# Patient Record
Sex: Female | Born: 1935 | Race: White | Hispanic: No | Smoking: Never smoker
Health system: Southern US, Community
[De-identification: ages and names within clinical notes are randomized; demographics above are authoritative.]

## PROBLEM LIST (undated history)

## (undated) DIAGNOSIS — H3530 Unspecified macular degeneration: Secondary | ICD-10-CM

## (undated) DIAGNOSIS — Z9289 Personal history of other medical treatment: Secondary | ICD-10-CM

## (undated) DIAGNOSIS — C189 Malignant neoplasm of colon, unspecified: Secondary | ICD-10-CM

## (undated) DIAGNOSIS — J189 Pneumonia, unspecified organism: Secondary | ICD-10-CM

## (undated) DIAGNOSIS — C541 Malignant neoplasm of endometrium: Secondary | ICD-10-CM

## (undated) DIAGNOSIS — H9190 Unspecified hearing loss, unspecified ear: Secondary | ICD-10-CM

## (undated) DIAGNOSIS — I639 Cerebral infarction, unspecified: Secondary | ICD-10-CM

## (undated) DIAGNOSIS — G43909 Migraine, unspecified, not intractable, without status migrainosus: Secondary | ICD-10-CM

## (undated) DIAGNOSIS — I10 Essential (primary) hypertension: Secondary | ICD-10-CM

## (undated) DIAGNOSIS — R7611 Nonspecific reaction to tuberculin skin test without active tuberculosis: Secondary | ICD-10-CM

## (undated) HISTORY — DX: Migraine, unspecified, not intractable, without status migrainosus: G43.909

## (undated) HISTORY — PX: APPENDECTOMY: SHX54

## (undated) HISTORY — DX: Personal history of other medical treatment: Z92.89

## (undated) HISTORY — DX: Malignant neoplasm of endometrium: C54.1

## (undated) HISTORY — DX: Pneumonia, unspecified organism: J18.9

## (undated) HISTORY — DX: Cerebral infarction, unspecified: I63.9

## (undated) HISTORY — DX: Essential (primary) hypertension: I10

## (undated) HISTORY — DX: Nonspecific reaction to tuberculin skin test without active tuberculosis: R76.11

## (undated) HISTORY — DX: Malignant neoplasm of colon, unspecified: C18.9

---
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
## 1968-05-14 HISTORY — PX: GALLBLADDER SURGERY: SHX652

## 1987-05-15 DIAGNOSIS — C541 Malignant neoplasm of endometrium: Secondary | ICD-10-CM

## 1987-05-15 HISTORY — DX: Malignant neoplasm of endometrium: C54.1

## 1987-05-15 HISTORY — PX: ABDOMINAL HYSTERECTOMY: SHX81

## 2000-05-14 DIAGNOSIS — I639 Cerebral infarction, unspecified: Secondary | ICD-10-CM

## 2000-05-14 HISTORY — DX: Cerebral infarction, unspecified: I63.9

## 2000-05-14 HISTORY — PX: SUBDURAL HEMATOMA EVACUATION VIA CRANIOTOMY: SUR319

## 2007-05-15 DIAGNOSIS — C189 Malignant neoplasm of colon, unspecified: Secondary | ICD-10-CM

## 2007-05-15 HISTORY — DX: Malignant neoplasm of colon, unspecified: C18.9

## 2007-08-27 HISTORY — PX: COLON RESECTION: SHX5231

## 2012-11-24 ENCOUNTER — Ambulatory Visit (INDEPENDENT_AMBULATORY_CARE_PROVIDER_SITE_OTHER): Payer: Medicare PPO | Admitting: Family Medicine

## 2012-11-24 ENCOUNTER — Encounter: Payer: Self-pay | Admitting: Family Medicine

## 2012-11-24 ENCOUNTER — Ambulatory Visit: Payer: Self-pay | Admitting: Family Medicine

## 2012-11-24 VITALS — BP 136/72 | Temp 98.0°F | Ht 61.5 in | Wt 130.0 lb

## 2012-11-24 DIAGNOSIS — Z8673 Personal history of transient ischemic attack (TIA), and cerebral infarction without residual deficits: Secondary | ICD-10-CM

## 2012-11-24 DIAGNOSIS — I10 Essential (primary) hypertension: Secondary | ICD-10-CM

## 2012-11-24 DIAGNOSIS — E039 Hypothyroidism, unspecified: Secondary | ICD-10-CM

## 2012-11-24 DIAGNOSIS — Z7189 Other specified counseling: Secondary | ICD-10-CM

## 2012-11-24 DIAGNOSIS — Z7689 Persons encountering health services in other specified circumstances: Secondary | ICD-10-CM

## 2012-11-24 LAB — BASIC METABOLIC PANEL
BUN: 15 mg/dL (ref 6–23)
GFR: 75.57 mL/min (ref >60.00)
Potassium: 4.8 mEq/L (ref 3.5–5.1)

## 2012-11-24 LAB — LIPID PANEL
Cholesterol: 145 mg/dL (ref 0–200)
VLDL: 14.2 mg/dL (ref 0.0–40.0)

## 2012-11-24 LAB — TSH: TSH: 1.13 u[IU]/mL (ref 0.35–5.50)

## 2012-11-24 MED ORDER — LISINOPRIL 20 MG PO TABS: 20.0000 mg | ORAL_TABLET | Freq: Every day | ORAL | Status: DC

## 2012-11-24 MED ORDER — LEVOTHYROXINE SODIUM 75 MCG PO TABS: 75.0000 ug | ORAL_TABLET | Freq: Every day | ORAL | Status: DC

## 2012-11-24 NOTE — Patient Instructions (Addendum)
-  We have ordered labs or studies at this visit. It can take up to 1-2 weeks for results and processing. We will contact you with instructions IF your results are abnormal. Normal results will be released to your MYCHART. If you have not heard from us or can not find your results in MYCHART in 2 weeks please contact our office.  -PLEASE SIGN UP FOR MYCHART TODAY   We recommend the following healthy lifestyle measures: - eat a healthy diet consisting of lots of vegetables, fruits, beans, nuts, seeds, healthy meats such as white chicken and fish and whole grains.  - avoid fried foods, fast food, processed foods, sodas, red meet and other fattening foods.  - get a least 150 minutes of aerobic exercise per week.   Follow up in: 4-6 months  

## 2012-11-24 NOTE — Progress Notes (Signed)
Chief Complaint  Patient presents with  . Establish Care    HPI:  Lori [name] is here to establish care. She has no concerns today. She has recently moved from [place] and now is living with her daughter. She reports a history of colon cancer and clean colonoscopy 3 years ago. She currently does not desire any further follow up of surveillance for this. Reports she feels well. Denies CP, SOB, DOE, bowel or bladder issues.  Has the following chronic problems and concerns today:  Patient Active Problem List   Diagnosis Date Noted  . Essential hypertension, benign 11/24/2012  . Hx of TIA (transient ischemic attack)  11/24/2012  . Unspecified hypothyroidism 11/24/2012   ROS: See pertinent positives and negatives per HPI.  Past Medical History  Diagnosis Date  . Arthritis   . Hypertension   . Migraines   . History of blood transfusion   . Positive TB test   . Stroke 2002    ? TIA, brief episode of speech issues - evaluate [place]  . Cancer 1989    endometrial s/p hysterectomy, chemo and radiation  . Colon cancer 2009    s/p colon resection    Family History  Problem Relation Age of Onset  . Arthritis Mother   . Heart disease Father   . Hyperlipidemia Father   . Hypertension Father   . Diabetes Sister     History   Social History  . Marital Status: Widowed    Spouse Name: N/A    Number of Children: N/A  . Years of Education: N/A   Social History Main Topics  . Smoking status: Never Smoker   . Smokeless tobacco: None  . Alcohol Use: Yes     Comment: occ glass of wine   . Drug Use: None  . Sexually Active: None   Other Topics Concern  . None   Social History Narrative   Work or School: retired      Ecologist Situation: lives with her daughter (HCPOA: Brooklyn Alfredo)      Spiritual Beliefs: Christian      Lifestyle: walks             Current outpatient prescriptions:aspirin 325 MG tablet, Take 325 mg by mouth daily., Disp: , Rfl: ;  CALCIUM PO, Take by mouth  daily., Disp: , Rfl: ;  Cholecalciferol (VITAMIN D PO), Take by mouth daily., Disp: , Rfl: ;  levothyroxine (SYNTHROID, LEVOTHROID) 75 MCG tablet, Take 1 tablet (75 mcg total) by mouth daily before breakfast., Disp: 90 tablet, Rfl: 3 lisinopril (PRINIVIL,ZESTRIL) 20 MG tablet, Take 1 tablet (20 mg total) by mouth daily., Disp: 90 tablet, Rfl: 3;  Multiple Vitamins-Minerals (OCUVITE ADULT 50+ PO), Take by mouth daily., Disp: , Rfl: ;  Riboflavin (VITAMIN B-2 PO), Take by mouth daily., Disp: , Rfl: ;  vitamin C (ASCORBIC ACID) 500 MG tablet, Take 500 mg by mouth daily., Disp: , Rfl:   EXAM:  Filed Vitals:   11/24/12 1056  BP: 136/72  Temp: 98 F (36.7 C)    Body mass index is 24.17 kg/(m^2).  GENERAL: vitals reviewed and listed above, alert, oriented, appears well hydrated and in no acute distress  HEENT: atraumatic, conjunttiva clear, no obvious abnormalities on inspection of external nose and ears  NECK: no obvious masses on inspection  LUNGS: clear to auscultation bilaterally, no wheezes, rales or rhonchi, good air movement  CV: HRRR, no peripheral edema  MS: moves all extremities without noticeable abnormality  PSYCH: pleasant and  cooperative, no obvious depression or anxiety  ASSESSMENT AND PLAN:  Discussed the following assessment and plan:  Essential hypertension, benign - Plan: Basic metabolic panel, Lipid Panel, lisinopril (PRINIVIL,ZESTRIL) 20 MG tablet  Hx of TIA (transient ischemic attack)  - Plan: Lipid Panel  Unspecified hypothyroidism - Plan: TSH, levothyroxine (SYNTHROID, LEVOTHROID) 75 MCG tablet  Encounter to establish care  -We reviewed the PMH, PSH, FH, SH, Meds and Allergies. -We provided refills for any medications we will prescribe as needed. -We addressed current concerns per orders and patient instructions. -We have asked for records for pertinent exams, studies, vaccines and notes from previous providers. -We have advised patient to follow up per  instructions below.  -Patient advised to return or notify a doctor immediately if symptoms worsen or persist or new concerns arise.  Patient Instructions  -We have ordered labs or studies at this visit. It can take up to 1-2 weeks for results and processing. We will contact you with instructions IF your results are abnormal. Normal results will be released to your [facility]. If you have not heard from [place] or can not find your results in [facility] in 2 weeks please contact our office.  -PLEASE SIGN UP FOR MYCHART TODAY   We recommend the following healthy lifestyle measures: - eat a healthy diet consisting of lots of vegetables, fruits, beans, nuts, seeds, healthy meats such as white chicken and fish and whole grains.  - avoid fried foods, fast food, processed foods, sodas, red meet and other fattening foods.  - get a least 150 minutes of aerobic exercise per week.   Follow up in: 4-6 months      Lunette Tapp R.

## 2012-11-26 NOTE — Progress Notes (Signed)
Quick Note:  Left a detailed message for pt at designated cell phone number. ______ 

## 2012-12-11 ENCOUNTER — Encounter: Payer: Self-pay | Admitting: Family Medicine

## 2012-12-11 NOTE — Progress Notes (Signed)
Some records received from prior PCP - labs, CT and MRI head, CT chest form last few years. Placed in scan box.

## 2013-01-02 ENCOUNTER — Ambulatory Visit (INDEPENDENT_AMBULATORY_CARE_PROVIDER_SITE_OTHER): Payer: Medicare PPO | Admitting: Family Medicine

## 2013-01-02 ENCOUNTER — Encounter: Payer: Self-pay | Admitting: Family Medicine

## 2013-01-02 VITALS — BP 138/88 | Temp 98.3°F | Wt 130.0 lb

## 2013-01-02 DIAGNOSIS — I10 Essential (primary) hypertension: Secondary | ICD-10-CM

## 2013-01-02 DIAGNOSIS — T148 Other injury of unspecified body region: Secondary | ICD-10-CM

## 2013-01-02 DIAGNOSIS — W57XXXA Bitten or stung by nonvenomous insect and other nonvenomous arthropods, initial encounter: Secondary | ICD-10-CM

## 2013-01-02 MED ORDER — TRIAMCINOLONE ACETONIDE 0.1 % EX CREA: TOPICAL_CREAM | Freq: Two times a day (BID) | CUTANEOUS | Status: DC

## 2013-01-02 NOTE — Progress Notes (Signed)
Chief Complaint  Patient presents with  . sore on ankle    HPI:  Acute visit for:  1)Sore on Leg: -noticed 2 weeks ago -was very itchy - thought it was a bug bite -tried a little hydrocortisone cream -oozes a little so thought might be infected  ROS: See pertinent positives and negatives per HPI.  Past Medical History  Diagnosis Date  . Arthritis   . Hypertension   . Migraines   . History of blood transfusion   . Positive TB test   . Stroke 2002    ? TIA, brief episode of speech issues - evaluate [place]  . Cancer 1989    endometrial s/p hysterectomy, chemo and radiation  . Colon cancer 2009    s/p colon resection    Family History  Problem Relation Age of Onset  . Arthritis Mother   . Heart disease Father   . Hyperlipidemia Father   . Hypertension Father   . Diabetes Sister     History   Social History  . Marital Status: Widowed    Spouse Name: N/A    Number of Children: N/A  . Years of Education: N/A   Social History Main Topics  . Smoking status: Never Smoker   . Smokeless tobacco: None  . Alcohol Use: Yes     Comment: occ glass of wine   . Drug Use: None  . Sexual Activity: None   Other Topics Concern  . None   Social History Narrative   Work or School: retired      Ecologist Situation: lives with her daughter (HCPOA: Joselin Crandell)      Spiritual Beliefs: Christian      Lifestyle: walks             Current outpatient prescriptions:aspirin 325 MG tablet, Take 325 mg by mouth daily., Disp: , Rfl: ;  CALCIUM PO, Take by mouth daily., Disp: , Rfl: ;  Cholecalciferol (VITAMIN D PO), Take by mouth daily., Disp: , Rfl: ;  levothyroxine (SYNTHROID, LEVOTHROID) 75 MCG tablet, Take 1 tablet (75 mcg total) by mouth daily before breakfast., Disp: 90 tablet, Rfl: 3 lisinopril (PRINIVIL,ZESTRIL) 20 MG tablet, Take 1 tablet (20 mg total) by mouth daily., Disp: 90 tablet, Rfl: 3;  Multiple Vitamins-Minerals (OCUVITE ADULT 50+ PO), Take by mouth daily., Disp: ,  Rfl: ;  Riboflavin (VITAMIN B-2 PO), Take by mouth daily., Disp: , Rfl: ;  vitamin C (ASCORBIC ACID) 500 MG tablet, Take 500 mg by mouth daily., Disp: , Rfl: ;  triamcinolone cream (KENALOG) 0.1 %, Apply topically 2 (two) times daily., Disp: 30 g, Rfl: 0  EXAM:  Filed Vitals:   01/02/13 1328  BP: 138/88  Temp: 98.3 F (36.8 C)    Body mass index is 24.17 kg/(m^2).  GENERAL: vitals reviewed and listed above, alert, oriented, appears well hydrated and in no acute distress  HEENT: atraumatic, conjunttiva clear, no obvious abnormalities on inspection of external nose and ears  NECK: no obvious masses on inspection  LUNGS: clear to auscultation bilaterally, no wheezes, rales or rhonchi, good air movement  CV: HRRR, no peripheral edema  MS: moves all extremities without noticeable abnormality  SKIN: small healing scab on L ankle  PSYCH: pleasant and cooperative, no obvious depression or anxiety  ASSESSMENT AND PLAN:  Discussed the following assessment and plan:  Insect bite - Plan: triamcinolone cream (KENALOG) 0.1 % -does not appear infected and appears to be healing, if persist will refer to derm for removal  Essential hypertension, benign -BP up initially, better on recheck  -Patient advised to return or notify a doctor immediately if symptoms worsen or persist or new concerns arise.  Patient Instructions  -apply antibiotic ointment twice daily and steroid cream 1-2 times daily  -follow in 2-4 weeks for physical exam     Gildardo Tickner R.

## 2013-01-02 NOTE — Patient Instructions (Addendum)
-  apply antibiotic ointment twice daily and steroid cream 1-2 times daily  -follow in 2-4 weeks for physical exam

## 2013-01-30 ENCOUNTER — Ambulatory Visit (INDEPENDENT_AMBULATORY_CARE_PROVIDER_SITE_OTHER): Payer: Medicare PPO | Admitting: Family Medicine

## 2013-01-30 ENCOUNTER — Encounter: Payer: Self-pay | Admitting: Family Medicine

## 2013-01-30 VITALS — BP 140/80 | Temp 98.1°F | Ht 61.5 in | Wt 128.0 lb

## 2013-01-30 DIAGNOSIS — I10 Essential (primary) hypertension: Secondary | ICD-10-CM

## 2013-01-30 DIAGNOSIS — Z0000 Encounter for general adult medical examination without abnormal findings: Secondary | ICD-10-CM

## 2013-01-30 DIAGNOSIS — E039 Hypothyroidism, unspecified: Secondary | ICD-10-CM

## 2013-01-30 DIAGNOSIS — Z23 Encounter for immunization: Secondary | ICD-10-CM

## 2013-01-30 LAB — BASIC METABOLIC PANEL
BUN: 13 mg/dL (ref 6–23)
Calcium: 9.2 mg/dL (ref 8.4–10.5)
GFR: 85.89 mL/min (ref >60.00)
Glucose, Bld: 88 mg/dL (ref 70–99)

## 2013-01-30 NOTE — Progress Notes (Signed)
Medicare Annual Preventive Care Visit  (initial annual wellness or annual wellness exam)  Chronic:  1)Hypothyroid: -stable  2)HTN: -stable  1.) Patient-completed health risk assessment  - completed and reviewed, see scanned documentation  2.) Review of Medical History: -PMH, PSH, Family History and current specialty and care providers reviewed and updated and listed below  - see chart and below  3.) Review of functional ability and level of safety:  Any difficulty hearing? YES, mild   History of falling? NO  Any trouble with IADLs - using a phone, using transportation, grocery shopping, preparing meals, doing housework, doing laundry, taking medications and managing money? NO  Advance Directives? LIDIA, CLAVIJO - [phone]  See summary of recommendations in Patient Instructions below.  4.) Physical Exam Filed Vitals:   01/30/13 0931  BP: 140/80  Temp: 98.1 F (36.7 C)   Estimated body mass index is 23.8 kg/(m^2) as calculated from the following:   Height as of this encounter: 5' 1.5" (1.562 m).   Weight as of this encounter: 128 lb (58.06 kg).  Mini Cog: 1. Patient instructed to listen carefully and repeat the following: Apple Watch    Penny  2. Clock drawing test was administered: NORMAL      3. Recall of three words: 3/3  Scoring:   Patient Score: NEG   See patient instructions for recommendations.  4)The following written screening schedule of preventive measures were reviewed with assessment and plan made per below, orders and patient instructions:         Alcohol screening: occ glass of wine     Obesity Screening and counseling: N/A     STI screening:  N/A     Tobacco Screening: not a smoker       Pneumococcal  ASSESSMENT/PLAN: given today      Screening mammograph (yearly if >40) ASSESSMENT/PLAN: refused      Screening Pap smear/pelvic exam (q2 years) ASSESSMENT/PLAN: aged out      Colorectal cancer screening (FOBT  yearly or flex sig q4y or colonoscopy q10y or barium enema q4y) ASSESSMENT/PLAN: history of colon cancer, refuses any further monitoring of this      Diabetes outpatient self-management training services ASSESSMENT/PLAN: N/A      Bone mass measurements(covered q2y if indicated - estrogen def, osteoporosis, hyperparathyroid, vertebral abnormalities, osteoporosis or steroids) ASSESSMENT/PLAN: refuses      Screening for glaucoma(q1y if high risk - diabetes, FH, AA and > 50 or hispanic and > 65) ASSESSMENT/PLAN: sees an eye doctor on regular basis      Medical nutritional therapy for individuals with diabetes or renal disease ASSESSMENT/PLAN: N/A      Cardiovascular screening blood tests (lipids q5y) ASSESSMENT/PLAN: done 11/2012      Diabetes screening tests ASSESSMENT/PLAN: done 11/2012   7.) Summary: -risk factors and conditions per above assessment were discussed and treatment, recommendations and referrals were offered per documentation above and orders and patient instructions.

## 2013-01-30 NOTE — Addendum Note (Signed)
Addended by: Terressa Koyanagi on: 01/30/2013 10:12 AM   Modules accepted: Orders

## 2013-01-30 NOTE — Addendum Note (Signed)
Addended by: Azucena Freed on: 01/30/2013 11:24 AM   Modules accepted: Orders

## 2013-01-30 NOTE — Progress Notes (Signed)
Quick Note:  Left a message for return call. ______ 

## 2013-01-30 NOTE — Patient Instructions (Addendum)
-  see audiology for hearing exam  -check into [employer] for exercise and balance  -We have ordered labs or studies at this visit. It can take up to 1-2 weeks for results and processing. We will contact you with instructions IF your results are abnormal. Normal results will be released to your [facility]. If you have not heard from [place] or can not find your results in [facility] in 2 weeks please contact our office.

## 2013-01-31 NOTE — Progress Notes (Signed)
Quick Note:  Called and spoke with pt and pt is aware. Labs mailed to pt's home address. ______

## 2013-03-16 ENCOUNTER — Ambulatory Visit (INDEPENDENT_AMBULATORY_CARE_PROVIDER_SITE_OTHER): Payer: Medicare PPO | Admitting: Internal Medicine

## 2013-03-16 ENCOUNTER — Encounter: Payer: Self-pay | Admitting: Internal Medicine

## 2013-03-16 VITALS — BP 120/84 | HR 71 | Temp 97.7°F | Ht 61.5 in | Wt 129.0 lb

## 2013-03-16 DIAGNOSIS — N3090 Cystitis, unspecified without hematuria: Secondary | ICD-10-CM

## 2013-03-16 DIAGNOSIS — R300 Dysuria: Secondary | ICD-10-CM

## 2013-03-16 LAB — POCT URINALYSIS DIPSTICK
Glucose, UA: NEGATIVE
Nitrite, UA: NEGATIVE
Spec Grav, UA: 1.02
Urobilinogen, UA: 0.2

## 2013-03-16 MED ORDER — CEFUROXIME AXETIL 250 MG PO TABS: 250.0000 mg | ORAL_TABLET | Freq: Two times a day (BID) | ORAL | Status: DC

## 2013-03-16 NOTE — Progress Notes (Signed)
Subjective:    Patient ID: Lori [name], female    DOB: 08/31/1918, 79 y.o.   MRN: [id number]  HPI  79 year old white female with history of hypertension, endometrial cancer and colon cancer complains of possible bladder infection. Her symptoms started 4-5 days ago. She has not had a bladder infection over a year. She has been trying to self treat with drinking plenty of water and cranberry juice. She may need complains of frequent urination and "yunky sensation".    Patient reports that due to her her treatment for endometrial cancer with radiation, she was told in the past she has a "pebbled" appearance on cystoscopy.  Review of Systems  Constitutional: Negative for fever and chills.  Genitourinary: Positive for frequency. Negative for dysuria, flank pain and pelvic pain.       Past Medical History  Diagnosis Date  . Arthritis   . Hypertension   . Migraines   . History of blood transfusion   . Positive TB test   . Stroke 2002    ? TIA, brief episode of speech issues - evaluate [place]  . Cancer 1989    endometrial s/p hysterectomy, chemo and radiation  . Colon cancer 2009    s/p colon resection    History   Social History  . Marital Status: Widowed    Spouse Name: N/A    Number of Children: N/A  . Years of Education: N/A   Occupational History  . Not on file.   Social History Main Topics  . Smoking status: Never Smoker   . Smokeless tobacco: Not on file  . Alcohol Use: Yes     Comment: occ glass of wine   . Drug Use: Not on file  . Sexual Activity: Not on file   Other Topics Concern  . Not on file   Social History Narrative   Work or School: retired      Ecologist Situation: lives with her daughter (HCPOA: Selenne Coggin)      Spiritual Beliefs: Christian      Lifestyle: walks             Past Surgical History  Procedure Laterality Date  . Abdominal hysterectomy  1989  . Gallbladder surgery  1970  . Tonsillectomy and adenoidectomy  1943  . Subdural  hematoma evacuation via craniotomy    . Colon resection      Family History  Problem Relation Age of Onset  . Arthritis Mother   . Heart disease Father   . Hyperlipidemia Father   . Hypertension Father   . Diabetes Sister     Allergies  Allergen Reactions  . Bactrim [Sulfamethoxazole-Trimethoprim]   . Macrobid [Nitrofurantoin Macrocrystal]   . Penicillins   . Sulfa Antibiotics     Current Outpatient Prescriptions on File Prior to Visit  Medication Sig Dispense Refill  . aspirin 325 MG tablet Take 325 mg by mouth daily.      Marland Kitchen CALCIUM PO Take by mouth daily.      . Cholecalciferol (VITAMIN D PO) Take by mouth daily.      Marland Kitchen levothyroxine (SYNTHROID, LEVOTHROID) 75 MCG tablet Take 1 tablet (75 mcg total) by mouth daily before breakfast.  90 tablet  3  . lisinopril (PRINIVIL,ZESTRIL) 20 MG tablet Take 1 tablet (20 mg total) by mouth daily.  90 tablet  3  . Multiple Vitamins-Minerals (OCUVITE ADULT 50+ PO) Take by mouth daily.      . Riboflavin (VITAMIN B-2 PO) Take by mouth daily.      Marland Kitchen  triamcinolone cream (KENALOG) 0.1 % Apply topically 2 (two) times daily.  30 g  0  . vitamin C (ASCORBIC ACID) 500 MG tablet Take 500 mg by mouth daily.       No current facility-administered medications on file prior to visit.    BP 120/84  Pulse 71  Temp(Src) 97.7 F (36.5 C) (Oral)  Ht 5' 1.5" (1.562 m)  Wt 129 lb (58.514 kg)  BMI 23.98 kg/m2  SpO2 96%    Objective:   Physical Exam  Constitutional: She is oriented to person, place, and time. She appears well-developed and well-nourished. No distress.  HENT:  Head: Normocephalic and atraumatic.  Mouth/Throat: Oropharynx is clear and moist.  Cardiovascular: Normal rate, regular rhythm and normal heart sounds.   No murmur heard. Pulmonary/Chest: Effort normal and breath sounds normal. She has no wheezes.  Abdominal: Soft. Bowel sounds are normal.  Mild suprapubic tenderness, no flank tenderness to percussion  Musculoskeletal: She  exhibits no edema.  Neurological: She is alert and oriented to person, place, and time. No cranial nerve deficit.  Skin: Skin is warm and dry.          Assessment & Plan:

## 2013-03-16 NOTE — Patient Instructions (Signed)
Drink plenty of fluids Please contact our office if your symptoms do not improve or gets worse.  

## 2013-03-16 NOTE — Assessment & Plan Note (Addendum)
79 year old white female with signs and symptoms of cystitis. Treat with cefuroxime 250 mg twice daily for 5 days. Send urine for culture.  Patient advised to increase fluid intake.  Patient advised to call office if symptoms persist or worsen.

## 2013-03-19 LAB — URINE CULTURE: Colony Count: >=100000

## 2013-03-23 ENCOUNTER — Ambulatory Visit: Payer: Medicare PPO | Admitting: Family Medicine

## 2013-04-30 ENCOUNTER — Emergency Department (HOSPITAL_COMMUNITY): Payer: Medicare PPO

## 2013-04-30 ENCOUNTER — Emergency Department (HOSPITAL_COMMUNITY)
Admission: EM | Admit: 2013-04-30 | Discharge: 2013-04-30 | Disposition: A | Discharge status: Home / Self Care | Payer: Medicare PPO | Attending: Emergency Medicine | Admitting: Emergency Medicine

## 2013-04-30 ENCOUNTER — Encounter (HOSPITAL_COMMUNITY): Payer: Self-pay | Admitting: Emergency Medicine

## 2013-04-30 ENCOUNTER — Telehealth: Payer: Self-pay | Admitting: Family Medicine

## 2013-04-30 DIAGNOSIS — R51 Headache: Secondary | ICD-10-CM | POA: Insufficient documentation

## 2013-04-30 DIAGNOSIS — Z79899 Other long term (current) drug therapy: Secondary | ICD-10-CM | POA: Insufficient documentation

## 2013-04-30 DIAGNOSIS — Z9889 Other specified postprocedural states: Secondary | ICD-10-CM | POA: Insufficient documentation

## 2013-04-30 DIAGNOSIS — I10 Essential (primary) hypertension: Secondary | ICD-10-CM | POA: Insufficient documentation

## 2013-04-30 DIAGNOSIS — R197 Diarrhea, unspecified: Secondary | ICD-10-CM | POA: Insufficient documentation

## 2013-04-30 DIAGNOSIS — Z923 Personal history of irradiation: Secondary | ICD-10-CM | POA: Insufficient documentation

## 2013-04-30 DIAGNOSIS — M129 Arthropathy, unspecified: Secondary | ICD-10-CM | POA: Insufficient documentation

## 2013-04-30 DIAGNOSIS — Z8542 Personal history of malignant neoplasm of other parts of uterus: Secondary | ICD-10-CM | POA: Insufficient documentation

## 2013-04-30 DIAGNOSIS — Z8611 Personal history of tuberculosis: Secondary | ICD-10-CM | POA: Insufficient documentation

## 2013-04-30 DIAGNOSIS — Z85038 Personal history of other malignant neoplasm of large intestine: Secondary | ICD-10-CM | POA: Insufficient documentation

## 2013-04-30 DIAGNOSIS — Z7982 Long term (current) use of aspirin: Secondary | ICD-10-CM | POA: Insufficient documentation

## 2013-04-30 DIAGNOSIS — Z90710 Acquired absence of both cervix and uterus: Secondary | ICD-10-CM | POA: Insufficient documentation

## 2013-04-30 DIAGNOSIS — G43909 Migraine, unspecified, not intractable, without status migrainosus: Secondary | ICD-10-CM | POA: Insufficient documentation

## 2013-04-30 DIAGNOSIS — Z880 Allergy status to penicillin: Secondary | ICD-10-CM | POA: Insufficient documentation

## 2013-04-30 LAB — CBC WITH DIFFERENTIAL/PLATELET
Basophils Relative: 0 % (ref 0–1)
Eosinophils Absolute: 0.1 10*3/uL (ref 0.0–0.7)
Hemoglobin: 12.5 g/dL (ref 12.0–15.0)
Lymphocytes Relative: 12 % (ref 12–46)
MCH: 31.5 pg (ref 26.0–34.0)
MCHC: 33.7 g/dL (ref 30.0–36.0)
Monocytes Absolute: 0.7 10*3/uL (ref 0.1–1.0)
Monocytes Relative: 6 % (ref 3–12)
Neutro Abs: 8.5 10*3/uL — ABNORMAL HIGH (ref 1.7–7.7)
Neutrophils Relative %: 81 % — ABNORMAL HIGH (ref 43–77)
Platelets: 249 10*3/uL (ref 150–400)
RBC: 3.97 MIL/uL (ref 3.87–5.11)
WBC: 10.6 10*3/uL — ABNORMAL HIGH (ref 4.0–10.5)

## 2013-04-30 LAB — COMPREHENSIVE METABOLIC PANEL
ALT: 12 U/L (ref 0–35)
AST: 21 U/L (ref 0–37)
Albumin: 3.3 g/dL — ABNORMAL LOW (ref 3.5–5.2)
Alkaline Phosphatase: 77 U/L (ref 39–117)
BUN: 17 mg/dL (ref 6–23)
Calcium: 8.7 mg/dL (ref 8.4–10.5)
Chloride: 106 mEq/L (ref 96–112)
GFR calc Af Amer: 89 mL/min — ABNORMAL LOW (ref >90)
Potassium: 3.5 mEq/L (ref 3.5–5.1)
Sodium: 140 mEq/L (ref 135–145)
Total Protein: 6.6 g/dL (ref 6.0–8.3)

## 2013-04-30 MED ORDER — SODIUM CHLORIDE 0.9 % IV BOLUS (SEPSIS)
500.0000 mL | Freq: Once | INTRAVENOUS | Status: AC
  Administered 2013-04-30: 500 mL via INTRAVENOUS

## 2013-04-30 MED ORDER — ONDANSETRON HCL 4 MG/2ML IJ SOLN
4.0000 mg | Freq: Once | INTRAMUSCULAR | Status: DC
  Filled 2013-04-30: qty 2

## 2013-04-30 MED ORDER — ONDANSETRON HCL 4 MG PO TABS: 4.0000 mg | ORAL_TABLET | Freq: Four times a day (QID) | ORAL | Status: DC

## 2013-04-30 NOTE — ED Notes (Signed)
Pt reports intermittent episodes of loose stools and constipation. Pt states past episodes were not diarrhea. Pt reports PCP "says I/pt need to have a colonoscopy soon." Pt hx of colon cancer.

## 2013-04-30 NOTE — ED Notes (Signed)
Pt states she has had diarrhea 4-5x today. No n/v. Also has some abd cramping.

## 2013-04-30 NOTE — Telephone Encounter (Signed)
Patient Information:  Caller Name: Raisa  Phone: [phone]  Patient: Sabrinia, Prien  Gender: Female  DOB: 23-Jan-1919  Age: 79 Years  PCP: Selena Batten (TEXT 1st, after 20 mins can call), [facility])  Office Follow Up:  Does the office need to follow up with this patient?: Yes  Instructions For The Office: Nurse called office and spoke with Alisha.  Elease Hashimoto states she will speak to Dr. Selena Batten about a work in appt and call patient back.  RN Note:  Catherine/Daughter calling because Braylea has a hx of colon cancer and partial resection in 2009.  States she has been losing weight and having increasing episodes of diarrhea.  Having watery diarrhea this morning.  States it has been uncontrollable.  No appts today.  Called office and spoke with Alisha.  Elease Hashimoto states she will talk to Dr. Selena Batten about working in an appt and will call patient back.  Informed daughter and patient.  Symptoms  Reason For Call & Symptoms: diarrhea  Reviewed Health History In EMR: Yes  Reviewed Medications In EMR: Yes  Reviewed Allergies In EMR: Yes  Reviewed Surgeries / Procedures: Yes  Date of Onset of Symptoms: 04/30/2013  Guideline(s) Used:  Diarrhea  Disposition Per Guideline:   Go to Office Now  Reason For Disposition Reached:   Age > 60 years and has had > 6 diarrhea stools in past 24 hours  Advice Given:  Call Back If:  You become worse.  Patient Will Follow Care Advice:  YES

## 2013-04-30 NOTE — ED Provider Notes (Signed)
CSN: [id number]     Arrival date & time 04/30/13  1258 History   First MD Initiated Contact with Patient 04/30/13 1317     Chief Complaint  Patient presents with  . Diarrhea   (Consider location/radiation/quality/duration/timing/severity/associated sxs/prior Treatment) HPI Comments: Patient presents to the ER for evaluation of watery diarrhea. Patient reports that she woke this morning and had urge to defecate. She reports explosive diarrhea. Since then she has had several episodes of watery, nonbloody diarrhea. She says this has tapered off within the last hour no further bowel movements. She had some nausea but no vomiting. There is cramping of the abdomen with the diarrhea, no abdominal pain currently. She has slight headache earlier, took an aspirin and has resolved.  Patient is a 79 y.o. female presenting with diarrhea.  Diarrhea Associated symptoms: headaches   Associated symptoms: no fever and no vomiting     Past Medical History  Diagnosis Date  . Arthritis   . Hypertension   . Migraines   . History of blood transfusion   . Positive TB test   . Stroke 2002    ? TIA, brief episode of speech issues - evaluate [place]  . Cancer 1989    endometrial s/p hysterectomy, chemo and radiation  . Colon cancer 2009    s/p colon resection   Past Surgical History  Procedure Laterality Date  . Abdominal hysterectomy  1989  . Gallbladder surgery  1970  . Tonsillectomy and adenoidectomy  1943  . Subdural hematoma evacuation via craniotomy    . Colon resection     Family History  Problem Relation Age of Onset  . Arthritis Mother   . Heart disease Father   . Hyperlipidemia Father   . Hypertension Father   . Diabetes Sister    History  Substance Use Topics  . Smoking status: Never Smoker   . Smokeless tobacco: Not on file  . Alcohol Use: Yes     Comment: occ glass of wine    OB History   Grav Para Term Preterm Abortions TAB SAB Ect Mult Living                 Review of  Systems  Constitutional: Negative for fever.  Gastrointestinal: Positive for nausea and diarrhea. Negative for vomiting, blood in stool and anal bleeding.  Neurological: Positive for headaches.  All other systems reviewed and are negative.    Allergies  Bactrim; Macrobid; Penicillins; and Sulfa antibiotics  Home Medications   Current Outpatient Rx  Name  Route  Sig  Dispense  Refill  . aspirin 325 MG tablet   Oral   Take 325 mg by mouth daily.         Marland Kitchen CALCIUM PO   Oral   Take by mouth daily.         . cefUROXime (CEFTIN) 250 MG tablet   Oral   Take 1 tablet (250 mg total) by mouth 2 (two) times daily.   10 tablet   0   . Cholecalciferol (VITAMIN D PO)   Oral   Take by mouth daily.         Marland Kitchen levothyroxine (SYNTHROID, LEVOTHROID) 75 MCG tablet   Oral   Take 1 tablet (75 mcg total) by mouth daily before breakfast.   90 tablet   3   . lisinopril (PRINIVIL,ZESTRIL) 20 MG tablet   Oral   Take 1 tablet (20 mg total) by mouth daily.   90 tablet   3   .  Multiple Vitamins-Minerals (OCUVITE ADULT 50+ PO)   Oral   Take by mouth daily.         . Riboflavin (VITAMIN B-2 PO)   Oral   Take by mouth daily.         Marland Kitchen triamcinolone cream (KENALOG) 0.1 %   Topical   Apply topically 2 (two) times daily.   30 g   0   . vitamin C (ASCORBIC ACID) 500 MG tablet   Oral   Take 500 mg by mouth daily.          BP 185/91  Pulse 92  Temp(Src) 98.2 F (36.8 C) (Oral)  Resp 20  Wt 122 lb (55.339 kg)  SpO2 97% Physical Exam  Constitutional: She is oriented to person, place, and time. She appears well-developed and well-nourished. No distress.  HENT:  Head: Normocephalic and atraumatic.  Right Ear: Hearing normal.  Left Ear: Hearing normal.  Nose: Nose normal.  Mouth/Throat: Oropharynx is clear and moist and mucous membranes are normal.  Eyes: Conjunctivae and EOM are normal. Pupils are equal, round, and reactive to light.  Neck: Normal range of motion. Neck  supple.  Cardiovascular: Regular rhythm, S1 normal and S2 normal.  Exam reveals no gallop and no friction rub.   No murmur heard. Pulmonary/Chest: Effort normal and breath sounds normal. No respiratory distress. She exhibits no tenderness.  Abdominal: Soft. Normal appearance and bowel sounds are normal. There is no hepatosplenomegaly. There is no tenderness. There is no rebound, no guarding, no tenderness at McBurney's point and negative Murphy's sign. No hernia.  Musculoskeletal: Normal range of motion.  Neurological: She is alert and oriented to person, place, and time. She has normal strength. No cranial nerve deficit or sensory deficit. Coordination normal. GCS eye subscore is 4. GCS verbal subscore is 5. GCS motor subscore is 6.  Skin: Skin is warm, dry and intact. No rash noted. No cyanosis.  Psychiatric: She has a normal mood and affect. Her speech is normal and behavior is normal. Thought content normal.    ED Course  Procedures (including critical care time) Labs Review Labs Reviewed  CBC WITH DIFFERENTIAL - Abnormal; Notable for the following:    WBC 10.6 (*)    Neutrophils Relative % 81 (*)    Neutro Abs 8.5 (*)    All other components within normal limits  COMPREHENSIVE METABOLIC PANEL - Abnormal; Notable for the following:    Albumin 3.3 (*)    GFR calc non Af Amer 77 (*)    GFR calc Af Amer 89 (*)    All other components within normal limits   Imaging Review Dg Abd Acute W/chest  04/30/2013   CLINICAL DATA:  Abdominal pain.  History of colon cancer.  EXAM: ACUTE ABDOMEN SERIES (ABDOMEN 2 VIEW & CHEST 1 VIEW)  COMPARISON:  None.  FINDINGS: Mild hyperinflation of the lungs suggesting emphysema. No acute cardiopulmonary disease. No free air underneath the hemidiaphragms. Surgical clips in the upper abdomen likely represent cholecystectomy clips. The bowel gas pattern is nonobstructive. Surgical staples are present in the right mid abdomen compatible with prior colon resection.  Right glenohumeral osteoarthritis is present with marginal osteophytes. Probable loose body in the superior subscapularis recess of the right shoulder.  IMPRESSION: Postoperative changes of the abdomen.  No acute abnormality.   Electronically Signed   By: Andreas Newport M.D.   On: 04/30/2013 15:19    EKG Interpretation   None       MDM  Diagnosis: Diarrhea  Patient presents to the ER for evaluation of her artery and diarrhea. She has had 4 or 5 episodes prior to arrival, but all symptoms have resolved and she is now comfortable. No further diarrhea here in the ER. No hypovolemia, no hypotension. She did miss her IV fluids, electrolytes and all lab work normal. She will be discharged, symptomatic treatment. Return if symptoms worsen.    Gilda Crease, MD 04/30/13 1535

## 2013-04-30 NOTE — Telephone Encounter (Signed)
Per Dr. Selena Batten if pt is having severe diarrhea she needs to go to [facility] or ED because she may need fluids.  Pt and daughter are aware and she will go to ED.

## 2013-06-02 ENCOUNTER — Ambulatory Visit (INDEPENDENT_AMBULATORY_CARE_PROVIDER_SITE_OTHER): Payer: Medicare PPO | Admitting: Family Medicine

## 2013-06-02 ENCOUNTER — Encounter: Payer: Self-pay | Admitting: Family Medicine

## 2013-06-02 VITALS — BP 150/80 | Temp 98.5°F | Wt 127.0 lb

## 2013-06-02 DIAGNOSIS — K5289 Other specified noninfective gastroenteritis and colitis: Secondary | ICD-10-CM

## 2013-06-02 DIAGNOSIS — K529 Noninfective gastroenteritis and colitis, unspecified: Secondary | ICD-10-CM

## 2013-06-02 DIAGNOSIS — R143 Flatulence: Secondary | ICD-10-CM

## 2013-06-02 DIAGNOSIS — R142 Eructation: Secondary | ICD-10-CM

## 2013-06-02 DIAGNOSIS — R141 Gas pain: Secondary | ICD-10-CM

## 2013-06-02 DIAGNOSIS — R195 Other fecal abnormalities: Secondary | ICD-10-CM

## 2013-06-02 NOTE — Progress Notes (Signed)
Pre visit review using our clinic review tool, if applicable. No additional management support is needed unless otherwise documented below in the visit note. 

## 2013-06-02 NOTE — Progress Notes (Signed)
Chief Complaint  Patient presents with  . gi issue    HPI:  Acute visit for GI issues: -had URI and stomach but a few weeks ago with watery diarrhea and nausea -since - has had some loose stools and gas and smaller volume stools, belching -thought was constipated about 1 week ago because had not had bowel movement in 3 days and has been taking prunes -denies: fevers, abd pain, blood in stool, melena -hx of colon cancer so she is worried about this  ROS: See pertinent positives and negatives per HPI.  Past Medical History  Diagnosis Date  . Arthritis   . Hypertension   . Migraines   . History of blood transfusion   . Positive TB test   . Stroke 2002    ? TIA, brief episode of speech issues - evaluate [place]  . Cancer 1989    endometrial s/p hysterectomy, chemo and radiation  . Colon cancer 2009    s/p colon resection    Past Surgical History  Procedure Laterality Date  . Abdominal hysterectomy  1989  . Gallbladder surgery  1970  . Tonsillectomy and adenoidectomy  1943  . Subdural hematoma evacuation via craniotomy    . Colon resection      Family History  Problem Relation Age of Onset  . Arthritis Mother   . Heart disease Father   . Hyperlipidemia Father   . Hypertension Father   . Diabetes Sister     History   Social History  . Marital Status: Widowed    Spouse Name: N/A    Number of Children: N/A  . Years of Education: N/A   Social History Main Topics  . Smoking status: Never Smoker   . Smokeless tobacco: None  . Alcohol Use: Yes     Comment: occ glass of wine   . Drug Use: None  . Sexual Activity: None   Other Topics Concern  . 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 , Disp: , Rfl: ;  Cholecalciferol (VITAMIN D PO), Take 1 capsule by mouth daily. , Disp: , Rfl: ;  levothyroxine (SYNTHROID, LEVOTHROID) 75 MCG tablet, Take 75 mcg by mouth daily before breakfast., Disp: , Rfl: ;  lisinopril (PRINIVIL,ZESTRIL) 20 MG tablet, Take 20 mg by mouth daily., Disp: , Rfl:  Multiple Vitamins-Minerals (OCUVITE ADULT 50+ PO), Take 1 tablet by mouth daily. , Disp: , Rfl: ;  ondansetron (ZOFRAN) 4 MG tablet, Take 1 tablet (4 mg total) by mouth every 6 (six) hours., Disp: 12 tablet, Rfl: 0;  Riboflavin (VITAMIN B-2 PO), Take 1 tablet by mouth daily. , Disp: , Rfl: ;  sodium chloride (OCEAN) 0.65 % nasal spray, Place 1 spray into the nose 2 (two) times daily as needed for congestion., Disp: , Rfl:  triamcinolone cream (KENALOG) 0.1 %, Apply 1 application topically 2 (two) times daily as needed (itching)., Disp: , Rfl: ;  vitamin C (ASCORBIC ACID) 500 MG tablet, Take 500 mg by mouth daily., Disp: , Rfl:   EXAM:  Filed Vitals:   06/02/13 0912  BP: 150/80  Temp: 98.5 F (36.9 C)    Body mass index is 23.61 kg/(m^2).  GENERAL: vitals reviewed and listed above, alert, oriented, appears well hydrated and in no acute distress  HEENT: atraumatic, conjunttiva clear, no obvious abnormalities on inspection of external nose and ears  NECK: no obvious masses on inspection  LUNGS: clear to auscultation bilaterally, no wheezes, rales or rhonchi, good air movement  ABD: bowel sounds +, soft, nttp  PSYCH: pleasant and cooperative, no obvious depression or anxiety  ASSESSMENT AND PLAN:  Discussed the following assessment and plan:  Loose stools  Belching  Gastroenteritis  -we discussed possible serious and likely etiologies, workup and treatment, treatment risks and return precautions -advised feel these symptoms are likely postinfectious and related to prune use -after this discussion, Anderia opted for instructions per below with close follow  up -follow up advised 2-3 weeks, she wants to then have referral to GI if symptoms persist -of course, we advised Marijayne  to return or notify a doctor immediately if symptoms worsen or persist or new concerns arise.  -Patient advised to return or notify a doctor immediately if symptoms worsen or persist or new concerns arise.  Patient Instructions  -stop prunes and dairy  -start prilosec 20mg  daily and simethicone (gas x) as needed  -let me know in 2-3 weeks how you are doing and if you wish to see the gastroenterologist     Lucretia Kern.

## 2013-06-02 NOTE — Patient Instructions (Signed)
-  stop prunes and dairy  -start prilosec 20mg  daily and simethicone (gas x) as needed  -let me know in 2-3 weeks how you are doing and if you wish to see the gastroenterologist

## 2013-06-05 ENCOUNTER — Ambulatory Visit: Payer: Medicare PPO | Admitting: Family Medicine

## 2013-06-15 ENCOUNTER — Telehealth: Payer: Self-pay | Admitting: Family Medicine

## 2013-06-15 NOTE — Telephone Encounter (Signed)
Pt states she was to let you know how she is doing, and pt states she doing better.  Some of the symptoms are still there, But would like to speak w/ you prior to making a gi appt appt set for 2/6 at 10am

## 2013-06-16 NOTE — Telephone Encounter (Signed)
Called and spoke with pt and pt states she just wanted to let us know how she was doing.

## 2013-06-16 NOTE — Telephone Encounter (Signed)
Left a message for return call.  

## 2013-06-19 ENCOUNTER — Ambulatory Visit (INDEPENDENT_AMBULATORY_CARE_PROVIDER_SITE_OTHER): Payer: Medicare PPO | Admitting: Family Medicine

## 2013-06-19 ENCOUNTER — Encounter: Payer: Self-pay | Admitting: Gastroenterology

## 2013-06-19 ENCOUNTER — Encounter: Payer: Self-pay | Admitting: Family Medicine

## 2013-06-19 VITALS — BP 140/80 | Temp 98.2°F | Wt 124.0 lb

## 2013-06-19 DIAGNOSIS — K5900 Constipation, unspecified: Secondary | ICD-10-CM

## 2013-06-19 DIAGNOSIS — R197 Diarrhea, unspecified: Secondary | ICD-10-CM

## 2013-06-19 DIAGNOSIS — Z85038 Personal history of other malignant neoplasm of large intestine: Secondary | ICD-10-CM

## 2013-06-19 MED ORDER — LISINOPRIL 20 MG PO TABS: 20.0000 mg | ORAL_TABLET | Freq: Every day | ORAL | Status: DC

## 2013-06-19 NOTE — Progress Notes (Signed)
Chief Complaint  Patient presents with  . Follow-up    HPI:  Follow up gI issues: -had what sounded like a GI bug with minimal symptoms a few days after at last exam -she was VERY anxious about this because of her hx of colon cancer so we decided on very close follow up -today reports: felt better on prilosec and probiotic. One week ago had mild constipation and then one episode of diarrhea. Normal BM since then. Decreased appetite. She is worried that has lost a few lbs. Had mild pain diffusely now resolved. -denies: hematochezia, melena, mucus in stools -she wants to see the gastroenterologist  HTN: -needed refill on lisinopril  ROS: See pertinent positives and negatives per HPI.  Past Medical History  Diagnosis Date  . Arthritis   . Hypertension   . Migraines   . History of blood transfusion   . Positive TB test   . Stroke 2002    ? TIA, brief episode of speech issues - evaluate [place]  . Cancer 1989    endometrial s/p hysterectomy, chemo and radiation  . Colon cancer 2009    s/p colon resection    Past Surgical History  Procedure Laterality Date  . Abdominal hysterectomy  1989  . Gallbladder surgery  1970  . Tonsillectomy and adenoidectomy  1943  . Subdural hematoma evacuation via craniotomy    . Colon resection      Family History  Problem Relation Age of Onset  . Arthritis Mother   . Heart disease Father   . Hyperlipidemia Father   . Hypertension Father   . Diabetes Sister     History   Social History  . Marital Status: Widowed    Spouse Name: N/A    Number of Children: N/A  . Years of Education: N/A   Social History Main Topics  . Smoking status: Never Smoker   . Smokeless tobacco: None  . Alcohol Use: Yes     Comment: occ glass of wine   . Drug Use: None  . Sexual Activity: None   Other Topics Concern  . 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 , Disp: , Rfl: ;  Cholecalciferol (VITAMIN D PO), Take 1 capsule by mouth daily. , Disp: , Rfl: ;  levothyroxine (SYNTHROID, LEVOTHROID) 75 MCG tablet, Take 75 mcg by mouth daily before breakfast., Disp: , Rfl:  lisinopril (PRINIVIL,ZESTRIL) 20 MG tablet, Take 1 tablet (20 mg total) by mouth daily., Disp: 90 tablet, Rfl: 3;  Multiple Vitamins-Minerals (OCUVITE ADULT 50+ PO), Take 1 tablet by mouth daily. , Disp: , Rfl: ;  ondansetron (ZOFRAN) 4 MG tablet, Take 1 tablet (4 mg total) by mouth every 6 (six) hours., Disp: 12 tablet, Rfl: 0;  Riboflavin (VITAMIN B-2 PO), Take 1 tablet by mouth daily. , Disp: , Rfl:  sodium chloride (OCEAN) 0.65 % nasal spray, Place 1 spray into the nose 2 (two) times daily as needed for congestion., Disp: , Rfl: ;  triamcinolone cream (KENALOG) 0.1 %, Apply 1 application topically 2 (two) times daily as needed (itching)., Disp: , Rfl: ;  vitamin C (ASCORBIC ACID) 500 MG tablet, Take 500 mg by mouth daily., Disp: , Rfl:   EXAM:  Filed Vitals:   06/19/13 1005  BP: 140/80  Temp: 98.2 F (36.8 C)    Body mass index is 23.05 kg/(m^2).  GENERAL: vitals reviewed and listed above, alert, oriented, appears well hydrated and in no acute distress  HEENT: atraumatic, conjunttiva clear, no obvious abnormalities on inspection of external nose and ears  NECK: no obvious masses on inspection  LUNGS: clear to auscultation bilaterally, no wheezes, rales or rhonchi, good air movement  CV: HRRR, no peripheral edema  ABD: BS+, soft, NTTP  MS: moves all extremities without noticeable abnormality  PSYCH: pleasant and cooperative, no obvious depression or anxiety  ASSESSMENT AND PLAN:  Discussed the following assessment and plan:  History of colon cancer - Plan:  Ambulatory referral to Gastroenterology  Unspecified constipation - Plan: Ambulatory referral to Gastroenterology  Diarrhea - Plan: Ambulatory referral to Gastroenterology  -referral placed per her request  -refilled blood pressure medication -Patient advised to return or notify a doctor immediately if symptoms worsen or persist or new concerns arise.  Patient Instructions  -We placed a referral for you as discussed to the gastroenterologist. It usually takes about 1-2 weeks to process and schedule this referral. If you have not heard from [place] regarding this appointment in 2 weeks please contact our office.      Colin Benton R.

## 2013-06-19 NOTE — Patient Instructions (Signed)
-  We placed a referral for you as discussed to the gastroenterologist. It usually takes about 1-2 weeks to process and schedule this referral. If you have not heard from [place] regarding this appointment in 2 weeks please contact our office.

## 2013-06-19 NOTE — Progress Notes (Signed)
Pre visit review using our clinic review tool, if applicable. No additional management support is needed unless otherwise documented below in the visit note. 

## 2013-07-21 ENCOUNTER — Ambulatory Visit: Payer: Medicare PPO | Admitting: Gastroenterology

## 2013-07-24 ENCOUNTER — Ambulatory Visit (INDEPENDENT_AMBULATORY_CARE_PROVIDER_SITE_OTHER): Payer: Medicare PPO | Admitting: Gastroenterology

## 2013-07-24 ENCOUNTER — Other Ambulatory Visit: Payer: Medicare PPO

## 2013-07-24 ENCOUNTER — Encounter: Payer: Self-pay | Admitting: Gastroenterology

## 2013-07-24 VITALS — BP 144/70 | HR 72 | Ht 60.25 in | Wt 126.4 lb

## 2013-07-24 DIAGNOSIS — R634 Abnormal weight loss: Secondary | ICD-10-CM

## 2013-07-24 DIAGNOSIS — Z8542 Personal history of malignant neoplasm of other parts of uterus: Secondary | ICD-10-CM

## 2013-07-24 DIAGNOSIS — C189 Malignant neoplasm of colon, unspecified: Secondary | ICD-10-CM

## 2013-07-24 DIAGNOSIS — Z85038 Personal history of other malignant neoplasm of large intestine: Secondary | ICD-10-CM

## 2013-07-24 DIAGNOSIS — R198 Other specified symptoms and signs involving the digestive system and abdomen: Secondary | ICD-10-CM

## 2013-07-24 DIAGNOSIS — R194 Change in bowel habit: Secondary | ICD-10-CM | POA: Insufficient documentation

## 2013-07-24 LAB — HEPATIC FUNCTION PANEL
ALT: 18 U/L (ref 0–35)
AST: 29 U/L (ref 0–37)
Albumin: 4.2 g/dL (ref 3.5–5.2)
Alkaline Phosphatase: 73 U/L (ref 39–117)
Bilirubin, Direct: 0 mg/dL (ref 0.0–0.3)
Total Bilirubin: 0.6 mg/dL (ref 0.3–1.2)
Total Protein: 7.7 g/dL (ref 6.0–8.3)

## 2013-07-24 NOTE — Assessment & Plan Note (Signed)
Etiology for weight loss is uncertain.  Patient offers no particular symptoms including poor appetite.  She has some concern about recurrent cancer.  Recommendations #1 check CEA, LFTs #2 CT of the abdomen and pelvis

## 2013-07-24 NOTE — Progress Notes (Signed)
Quick Note:  Please inform the patient that lab work was normal and to continue current plan of action ______ 

## 2013-07-24 NOTE — Progress Notes (Signed)
_                                                                                                                History of Present Illness: Pleasant 80 year old white female with history of right colon cancer, status post resection in 2009, endometrial CA with postop chemotherapy and radiation therapy, referred for evaluation of change in bowel habits.  Over the past one to 2 years she's had a mild change in bowel habits consisting of alternating episodes of constipation and then loose and sometimes frankly diarrheal stools.  This has been a rather stable pattern.  There is no history of abdominal pain or rectal bleeding.  Over the past 6-8 weeks stools have become more regular since using probiotics.  Her daughter states, however, the she's lost about 18 pounds over the past year.  Appetite is good.     Past Medical History  Diagnosis Date  . Arthritis   . Hypertension   . Migraines   . History of blood transfusion   . Positive TB test   . Stroke 2002    ? TIA, brief episode of speech issues - evaluate [place]  . Endometrial cancer 1989    endometrial s/p hysterectomy, chemo and radiation  . Colon cancer 2009    s/p colon resection  . Pneumonia    Past Surgical History  Procedure Laterality Date  . Abdominal hysterectomy  1989  . Gallbladder surgery  1970  . Tonsillectomy and adenoidectomy  1943  . Subdural hematoma evacuation via craniotomy    . Colon resection  08/27/2007  . Appendectomy     family history includes Arthritis in her mother; Crohn's disease in her daughter; Diabetes in her sister; Heart disease in her father; Hyperlipidemia in her father; Hypertension in her father. Current Outpatient Prescriptions  Medication Sig Dispense Refill  . aspirin 325 MG tablet Take 325 mg by mouth daily.      Marland Kitchen CALCIUM PO Take 1 tablet by mouth daily.       . Cholecalciferol (VITAMIN D PO) Take 1 capsule by mouth daily.       Marland Kitchen levothyroxine (SYNTHROID, LEVOTHROID) 75  MCG tablet Take 75 mcg by mouth daily before breakfast.      . lisinopril (PRINIVIL,ZESTRIL) 20 MG tablet Take 1 tablet (20 mg total) by mouth daily.  90 tablet  3  . Multiple Vitamins-Minerals (OCUVITE ADULT 50+ PO) Take 1 tablet by mouth daily.       . Probiotic Product (PROBIOTIC DAILY PO) Take 1 tablet by mouth daily.      . Riboflavin (VITAMIN B-2 PO) Take 1 tablet by mouth daily.       . sodium chloride (OCEAN) 0.65 % nasal spray Place 1 spray into the nose 2 (two) times daily as needed for congestion.      . triamcinolone cream (KENALOG) 0.1 % Apply 1 application topically 2 (two) times daily as needed (itching).      . vitamin C (ASCORBIC ACID) 500 MG tablet  Take 500 mg by mouth daily.       No current facility-administered medications for this visit.   Allergies as of 07/24/2013 - Review Complete 07/24/2013  Allergen Reaction Noted  . Bactrim [sulfamethoxazole-trimethoprim] Itching 11/24/2012  . Macrobid [nitrofurantoin macrocrystal] Itching 11/24/2012  . Penicillins Itching 11/24/2012  . Sulfa antibiotics Itching 11/24/2012    reports that she has never smoked. She has never used smokeless tobacco. She reports that she drinks alcohol. She reports that she does not use illicit drugs.     Review of Systems: Pertinent positive and negative review of systems were noted in the above HPI section. All other review of systems were otherwise negative.  Vital signs were reviewed in today's medical record Physical Exam: General: Well developed , well nourished, no acute distress Skin: anicteric Head: Normocephalic and atraumatic Eyes:  sclerae anicteric, EOMI Ears: Normal auditory acuity Mouth: No deformity or lesions Neck: Supple, no masses or thyromegaly Lungs: Clear throughout to auscultation Heart: Regular rate and rhythm; no murmurs, rubs or bruits Abdomen: Soft, non tender and non distended. No masses, hepatosplenomegaly or hernias noted. Normal Bowel sounds Rectal: There  are no masses.  Stool is Hemoccult negative Musculoskeletal: Symmetrical with no gross deformities  Skin: No lesions on visible extremities Pulses:  Normal pulses noted Extremities: No clubbing, cyanosis, edema or deformities noted Neurological: Alert oriented x 4, grossly nonfocal Cervical Nodes:  No significant cervical adenopathy Inguinal Nodes: No significant inguinal adenopathy Psychological:  Alert and cooperative. Normal mood and affect  See Assessment and Plan under Problem List

## 2013-07-24 NOTE — Patient Instructions (Addendum)
Go to the basement for labs  You have been scheduled for a CT scan of the abdomen and pelvis at [facility] ([address]---this is in the same building as Press [facility]).   You are scheduled on ____ at ________. You should arrive 15 minutes prior to your appointment time for registration. Please follow the written instructions below on the day of your exam:  WARNING: IF YOU ARE ALLERGIC TO IODINE/X-RAY DYE, PLEASE NOTIFY RADIOLOGY IMMEDIATELY AT [phone]! YOU WILL BE GIVEN A 13 HOUR PREMEDICATION PREP.  1) Do not eat or drink anything after ________ (4 hours prior to your test) 2) You have been given 2 bottles of oral contrast to drink. The solution may taste               better if refrigerated, but do NOT add ice or any other liquid to this solution. Shake             well before drinking.    Drink 1 bottle of contrast @ ______ (2 hours prior to your exam)  Drink 1 bottle of contrast @ __________ (1 hour prior to your exam)  You may take any medications as prescribed with a small amount of water except for the following: Metformin, Glucophage, Glucovance, Avandamet, Riomet, Fortamet, Actoplus Met, Janumet, Glumetza or Metaglip. The above medications must be held the day of the exam AND 48 hours after the exam.  The purpose of you drinking the oral contrast is to aid in the visualization of your intestinal tract. The contrast solution may cause some diarrhea. Before your exam is started, you will be given a small amount of fluid to drink. Depending on your individual set of symptoms, you may also receive an intravenous injection of x-ray contrast/dye. Plan on being at [facility] for 30 minutes or long, depending on the type of exam you are having performed.  This test typically takes 30-45 minutes to complete.  If you have any questions regarding your exam or if you need to reschedule, you may call the CT department at [phone] between the hours of 8:00 am  and 5:00 pm, Monday-Friday.  ________________________________________________________________________

## 2013-07-24 NOTE — Assessment & Plan Note (Signed)
Change in bowel habits has been relatively minor, and stable.  Exam is unremarkable including Hemoccult negative stool.  Recommendations #1 no further GI workup.  In view of weight loss I will proceed with a CT scan only

## 2013-07-25 LAB — CEA: CEA: 2.4 ng/mL (ref 0.0–5.0)

## 2013-07-27 ENCOUNTER — Telehealth: Payer: Self-pay | Admitting: *Deleted

## 2013-07-27 DIAGNOSIS — C189 Malignant neoplasm of colon, unspecified: Secondary | ICD-10-CM

## 2013-07-27 DIAGNOSIS — R109 Unspecified abdominal pain: Secondary | ICD-10-CM

## 2013-07-27 NOTE — Progress Notes (Signed)
Quick Note:  Please inform the patient that lab work (CEA) was normal and to continue current plan of action ______

## 2013-07-27 NOTE — Telephone Encounter (Signed)
Patient is scheduled for CT scan on Friday 3/20  Patient has all instructions  Patient to have BMET done bfore her CT appointment  Pt aware

## 2013-07-27 NOTE — Telephone Encounter (Signed)
Message copied by Oda Kilts on Mon Jul 27, 2013  4:28 PM ------      Message from: HUNT, Virginia R      Created: Mon Jul 27, 2013  3:09 PM      Regarding: CT scan       Pt states she is supposed to have a CT scan and she was given bottles of contrast at her OV with Dr. Deatra Ina. Pt does not know when the CT is scheduled for or when she is supposed to drink the contrast. No appt found in computer.            Please call pt.      Thanks,      Vaughan Basta ------

## 2013-07-31 ENCOUNTER — Other Ambulatory Visit: Payer: Self-pay | Admitting: *Deleted

## 2013-07-31 ENCOUNTER — Other Ambulatory Visit (INDEPENDENT_AMBULATORY_CARE_PROVIDER_SITE_OTHER): Payer: Medicare PPO

## 2013-07-31 ENCOUNTER — Ambulatory Visit (INDEPENDENT_AMBULATORY_CARE_PROVIDER_SITE_OTHER)
Admission: RE | Admit: 2013-07-31 | Discharge: 2013-07-31 | Disposition: A | Discharge status: Home / Self Care | Payer: Medicare PPO | Source: Ambulatory Visit | Attending: Gastroenterology | Admitting: Gastroenterology

## 2013-07-31 DIAGNOSIS — C189 Malignant neoplasm of colon, unspecified: Secondary | ICD-10-CM

## 2013-07-31 DIAGNOSIS — R109 Unspecified abdominal pain: Secondary | ICD-10-CM

## 2013-07-31 LAB — BASIC METABOLIC PANEL
BUN: 21 mg/dL (ref 6–23)
CO2: 30 mEq/L (ref 19–32)
Calcium: 9.4 mg/dL (ref 8.4–10.5)
Chloride: 105 mEq/L (ref 96–112)
Creatinine, Ser: 0.9 mg/dL (ref 0.4–1.2)
GFR: 62.89 mL/min (ref >60.00)
Glucose, Bld: 104 mg/dL — ABNORMAL HIGH (ref 70–99)
POTASSIUM: 4 meq/L (ref 3.5–5.1)
SODIUM: 140 meq/L (ref 135–145)

## 2013-07-31 MED ORDER — IOHEXOL 300 MG/ML  SOLN
100.0000 mL | Freq: Once | INTRAMUSCULAR | Status: AC | PRN
  Administered 2013-07-31: 100 mL via INTRAVENOUS

## 2013-07-31 NOTE — Progress Notes (Signed)
Quick Note:  Please inform the patient that CT scan was normal and to continue current plan of action ______

## 2013-10-26 ENCOUNTER — Encounter: Payer: Self-pay | Admitting: Family Medicine

## 2013-10-26 ENCOUNTER — Ambulatory Visit (INDEPENDENT_AMBULATORY_CARE_PROVIDER_SITE_OTHER): Payer: Medicare PPO | Admitting: Family Medicine

## 2013-10-26 VITALS — BP 138/80 | HR 69 | Temp 98.3°F | Ht 60.03 in | Wt 125.0 lb

## 2013-10-26 DIAGNOSIS — I10 Essential (primary) hypertension: Secondary | ICD-10-CM

## 2013-10-26 DIAGNOSIS — H3530 Unspecified macular degeneration: Secondary | ICD-10-CM

## 2013-10-26 DIAGNOSIS — R21 Rash and other nonspecific skin eruption: Secondary | ICD-10-CM

## 2013-10-26 DIAGNOSIS — Z8673 Personal history of transient ischemic attack (TIA), and cerebral infarction without residual deficits: Secondary | ICD-10-CM

## 2013-10-26 DIAGNOSIS — L821 Other seborrheic keratosis: Secondary | ICD-10-CM

## 2013-10-26 DIAGNOSIS — E039 Hypothyroidism, unspecified: Secondary | ICD-10-CM

## 2013-10-26 MED ORDER — TRIAMCINOLONE ACETONIDE 0.1 % EX CREA: 1.0000 "application " | TOPICAL_CREAM | Freq: Two times a day (BID) | CUTANEOUS | Status: DC | PRN

## 2013-10-26 MED ORDER — LEVOTHYROXINE SODIUM 75 MCG PO TABS: 75.0000 ug | ORAL_TABLET | Freq: Every day | ORAL | Status: DC

## 2013-10-26 MED ORDER — KETOCONAZOLE 2 % EX SHAM: 1.0000 "application " | MEDICATED_SHAMPOO | CUTANEOUS | Status: DC

## 2013-10-26 NOTE — Progress Notes (Signed)
No chief complaint on file.   HPI:  Follow up:  Difficulty with ambulation: -poor vision from macular degeneration - sees optho -uses cane and can not ambulate without -needs DMV form completed for handicap parking card - she reports daughter drives her as she no longer drives -no falls  HTN/Hx of TIA: -takes asa and lisinopril -denies: CP, SOB, HA  Hypothyroidism: -on levothyroxine and stable for a long time -denies: malaise, palpitations, constipation  Dry Skin: -pruritis occ on back, head and both legs -wants refill on steroid cream  ROS: See pertinent positives and negatives per HPI.  Past Medical History  Diagnosis Date  . Arthritis   . Hypertension   . Migraines   . History of blood transfusion   . Positive TB test   . Stroke 2002    ? TIA, brief episode of speech issues - evaluate [place]  . Endometrial cancer 1989    endometrial s/p hysterectomy, chemo and radiation  . Colon cancer 2009    s/p colon resection  . Pneumonia     Past Surgical History  Procedure Laterality Date  . Abdominal hysterectomy  1989  . Gallbladder surgery  1970  . Tonsillectomy and adenoidectomy  1943  . Subdural hematoma evacuation via craniotomy    . Colon resection  08/27/2007  . Appendectomy      Family History  Problem Relation Age of Onset  . Arthritis Mother   . Heart disease Father   . Hyperlipidemia Father   . Hypertension Father   . Diabetes Sister   . Crohn's disease Daughter     History   Social History  . Marital Status: Widowed    Spouse Name: N/A    Number of Children: 2  . Years of Education: N/A   Occupational History  . retired    Social History Main Topics  . Smoking status: Never Smoker   . Smokeless tobacco: Never Used  . Alcohol Use: Yes     Comment: occ glass of wine   . Drug Use: No  . Sexual Activity: None   Other Topics Concern  . 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 , Disp: , Rfl: ;  Cholecalciferol (VITAMIN D PO), Take 1 capsule by mouth daily. , Disp: , Rfl: ;  levothyroxine (SYNTHROID, LEVOTHROID) 75 MCG tablet, Take 1 tablet (75 mcg total) by mouth daily before breakfast., Disp: 90 tablet, Rfl: 3 lisinopril (PRINIVIL,ZESTRIL) 20 MG tablet, Take 1 tablet (20 mg total) by mouth daily., Disp: 90 tablet, Rfl: 3;  Multiple Vitamins-Minerals (OCUVITE ADULT 50+ PO), Take 1 tablet by mouth daily. , Disp: , Rfl: ;  Riboflavin (VITAMIN B-2 PO), Take 1 tablet by mouth daily. , Disp: , Rfl: ;  sodium chloride (OCEAN) 0.65 % nasal spray, Place 1 spray into the nose 2 (two) times daily as needed for congestion., Disp: , Rfl:  triamcinolone cream (KENALOG) 0.1 %, Apply 1 application topically 2 (two) times daily as needed (itching)., Disp: 30 g, Rfl: 0;  vitamin C (ASCORBIC ACID) 500 MG tablet, Take 500 mg by mouth daily.,  Disp: , Rfl: ;  ketoconazole (NIZORAL) 2 % shampoo, Apply 1 application topically 2 (two) times a week., Disp: 120 mL, Rfl: 0  EXAM:  Filed Vitals:   10/26/13 1309  BP: 138/80  Pulse: 69  Temp: 98.3 F (36.8 C)    Body mass index is 24.38 kg/(m^2).  GENERAL: vitals reviewed and listed above, alert, oriented, appears well hydrated and in no acute distress  HEENT: atraumatic, conjunttiva clear, no obvious abnormalities on inspection of external nose and ears  NECK: no obvious masses on inspection  LUNGS: clear to auscultation bilaterally, no wheezes, rales or rhonchi, good air movement  CV: HRRR, no peripheral edema  MS: moves all extremities without noticeable abnormality  SKIN: few scaly areas on ankle, scaly silvering plaque on back of scalp  PSYCH: pleasant and cooperative, no obvious  depression or anxiety  ASSESSMENT AND PLAN:  Discussed the following assessment and plan:  Seborrheic keratoses Rash, skin - Plan: ketoconazole (NIZORAL) 2 % shampoo, triamcinolone cream (KENALOG) 0.1 % -looks like some eczema on legs, sk on back, patch on scalp query seb derm, eczema, psoriasis  -discussed options - follow up if scalp does not improve  Macular degeneration -DMV Essential hypertension, benign  Hx of TIA (transient ischemic attack)  -continue asa -labs with physical in September  Unspecified hypothyroidism -she wants to hold of on TSH and check in September  -Patient advised to return or notify a doctor immediately if symptoms worsen or persist or new concerns arise.  There are no Patient Instructions on file for this visit.   Colin Benton R.

## 2013-10-26 NOTE — Progress Notes (Signed)
Pre visit review using our clinic review tool, if applicable. No additional management support is needed unless otherwise documented below in the visit note. 

## 2013-10-27 ENCOUNTER — Telehealth: Payer: Self-pay | Admitting: Family Medicine

## 2013-10-27 NOTE — Telephone Encounter (Signed)
Relevant patient education mailed to patient.  

## 2014-02-05 ENCOUNTER — Other Ambulatory Visit: Payer: Self-pay | Admitting: *Deleted

## 2014-02-05 ENCOUNTER — Ambulatory Visit (INDEPENDENT_AMBULATORY_CARE_PROVIDER_SITE_OTHER): Payer: Medicare PPO | Admitting: Family Medicine

## 2014-02-05 ENCOUNTER — Encounter: Payer: Self-pay | Admitting: *Deleted

## 2014-02-05 ENCOUNTER — Encounter: Payer: Self-pay | Admitting: Family Medicine

## 2014-02-05 VITALS — BP 126/76 | HR 74 | Temp 97.8°F | Ht 61.5 in | Wt 121.0 lb

## 2014-02-05 DIAGNOSIS — Z23 Encounter for immunization: Secondary | ICD-10-CM

## 2014-02-05 DIAGNOSIS — I10 Essential (primary) hypertension: Secondary | ICD-10-CM

## 2014-02-05 DIAGNOSIS — Z85038 Personal history of other malignant neoplasm of large intestine: Secondary | ICD-10-CM

## 2014-02-05 DIAGNOSIS — Z8673 Personal history of transient ischemic attack (TIA), and cerebral infarction without residual deficits: Secondary | ICD-10-CM

## 2014-02-05 DIAGNOSIS — J069 Acute upper respiratory infection, unspecified: Secondary | ICD-10-CM

## 2014-02-05 DIAGNOSIS — Z0000 Encounter for general adult medical examination without abnormal findings: Secondary | ICD-10-CM

## 2014-02-05 DIAGNOSIS — E039 Hypothyroidism, unspecified: Secondary | ICD-10-CM

## 2014-02-05 LAB — BASIC METABOLIC PANEL
BUN: 16 mg/dL (ref 6–23)
CALCIUM: 9.3 mg/dL (ref 8.4–10.5)
CHLORIDE: 103 meq/L (ref 96–112)
CO2: 26 meq/L (ref 19–32)
Creatinine, Ser: 0.7 mg/dL (ref 0.4–1.2)
GFR: 77.73 mL/min (ref >60.00)
Glucose, Bld: 92 mg/dL (ref 70–99)
Potassium: 4.8 mEq/L (ref 3.5–5.1)
Sodium: 138 mEq/L (ref 135–145)

## 2014-02-05 LAB — LIPID PANEL
CHOL/HDL RATIO: 2
Cholesterol: 133 mg/dL (ref 0–200)
HDL: 68.5 mg/dL (ref >39.00)
LDL Cholesterol: 48 mg/dL (ref 0–99)
NONHDL: 64.5
TRIGLYCERIDES: 84 mg/dL (ref 0.0–149.0)
VLDL: 16.8 mg/dL (ref 0.0–40.0)

## 2014-02-05 LAB — TSH: TSH: 0.3 u[IU]/mL — ABNORMAL LOW (ref 0.35–4.50)

## 2014-02-05 MED ORDER — LEVOTHYROXINE SODIUM 50 MCG PO TABS: 50.0000 ug | ORAL_TABLET | Freq: Every day | ORAL | Status: DC

## 2014-02-05 NOTE — Patient Instructions (Signed)
-  We have ordered labs or studies at this visit. It can take up to 1-2 weeks for results and processing. We will contact you with instructions IF your results are abnormal. Normal results will be released to your [facility]. If you have not heard from [place] or can not find your results in [facility] in 2 weeks please contact our office.   We recommend the following healthy lifestyle measures: - eat a healthy diet consisting of lots of vegetables, fruits, beans, nuts, seeds, healthy meats such as white chicken and fish and whole grains.  - avoid fried foods, fast food, processed foods, sodas, red meet and other fattening foods.  - get a least 150 minutes of aerobic exercise per week.   Follow up in: 6 months

## 2014-02-05 NOTE — Addendum Note (Signed)
Addended by: Agnes Lawrence on: 02/05/2014 10:24 AM   Modules accepted: Orders

## 2014-02-05 NOTE — Progress Notes (Signed)
Medicare Annual Preventive Care Visit  (initial annual wellness or annual wellness exam)  Concerns and/or follow up today:  HTN/Hx of TIA:  -takes asa and lisinopril  -denies: CP, SOB, HA   Hypothyroidism:  -on levothyroxine and stable for a long time  -denies: malaise, palpitations, constipation   Cough: -had a cold last week -still a little drainage in the throat and cough persist -denies: fevers, chills, sinus pain, SOB -daughter with the same  ROS: negative for report of fevers, unintentional weight loss, vision changes, vision loss, hearing loss or change, chest pain, sob, hemoptysis, melena, hematochezia, hematuria, genital discharge or lesions, falls, bleeding or bruising, loc, thoughts of suicide or self harm, memory loss  1.) Patient-completed health risk assessment  - completed and reviewed, see scanned documentation  2.) Review of Medical History: -PMH, PSH, Family History and current specialty and care providers reviewed and updated and listed below  - see scanned in document in chart and below  Past Medical History  Diagnosis Date  . Arthritis   . Hypertension   . Migraines   . History of blood transfusion   . Positive TB test   . Stroke 2002    ? TIA, brief episode of speech issues - evaluate [place]  . Endometrial cancer 1989    endometrial s/p hysterectomy, chemo and radiation  . Colon cancer 2009    s/p colon resection  . Pneumonia     Past Surgical History  Procedure Laterality Date  . Abdominal hysterectomy  1989  . Gallbladder surgery  1970  . Tonsillectomy and adenoidectomy  1943  . Subdural hematoma evacuation via craniotomy    . Colon resection  08/27/2007  . Appendectomy      History   Social History  . Marital Status: Widowed    Spouse Name: N/A    Number of Children: 2  . Years of Education: N/A   Occupational History  . retired    Social History Main Topics  . Smoking status: Never Smoker   . Smokeless tobacco: Never Used  .  Alcohol Use: Yes     Comment: occ glass of wine   . Drug Use: No  . Sexual Activity: Not on file   Other Topics Concern  . 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 YES - uses hearing aides  History of falling? NO  Any trouble with IADLs - using a phone, using transportation, grocery shopping, preparing meals, doing housework, doing laundry, taking medications and managing money?  NO - except daughter drives due to her macular degeneration  Advance Directives? YES HCPOA Caliah Kopke [phone]  See summary of recommendations in Patient Instructions below.  4.) Physical Exam Filed Vitals:   02/05/14 0936  BP: 126/76  Pulse: 74  Temp: 97.8 F (36.6 C)   Estimated body mass index is 22.5 kg/(m^2) as calculated from the following:   Height as of this encounter: 5' 1.5" (1.562 m).   Weight as of this encounter: 121 lb (54.885 kg).  EKG (optional): deferred  General: alert, appear well hydrated and in no acute distress  HEENT: visual acuity grossly intact, normal appearance of ear canals and TMs, clear nasal congestion, mild post oropharyngeal erythema with PND, no tonsillar edema or exudate, no sinus TTP  CV: HRRR  Lungs: CTA bilaterally  Psych: pleasant and cooperative, no obvious depression or anxiety  Mini Cog: 1. Patient instructed to listen carefully and repeat the following: [place]  2. Clock drawing test was administered: NORMAL      3. Recall of three words: 3/3  Scoring:  Patient Score: NEG    See patient instructions for recommendations.  Education and counseling regarding the above review of health provided with a plan for the  following: -see scanned patient completed form for further details -fall prevention strategies discussed  -healthy lifestyle discussed -importance and resources for completing advanced directives discussed -see patient instructions below for any other recommendations provided  4)The following written screening schedule of preventive measures were reviewed with assessment and plan made per below, orders and patient instructions:      AAA screening n/a     Alcohol screening done     Obesity Screening and counseling done     STI screening declined     Tobacco Screening done       Pneumococcal (PPSV23 -one dose after 64, one before if risk factors), influenza yearly and hepatitis B vaccines (if high risk - end stage renal disease, IV drugs, homosexual men, live in home for mentally retarded, hemophilia receiving factors) ASSESSMENT/PLAN: wants prevnar 79 today      Screening mammograph (yearly if >40) ASSESSMENT/PLAN: refused      Screening Pap smear/pelvic exam (q2 years) ASSESSMENT/PLAN: n/a       Prostate cancer screening ASSESSMENT/PLAN: n/a      Colorectal cancer screening (FOBT yearly or flex sig q4y or colonoscopy q10y or barium enema q4y) ASSESSMENT/PLAN: sees GI for this      Diabetes outpatient self-management training services ASSESSMENT/PLAN: n/a      Bone mass measurements(covered q2y if indicated - estrogen def, osteoporosis, hyperparathyroid, vertebral abnormalities, osteoporosis or steroids) ASSESSMENT/PLAN: refuses      Screening for glaucoma(q1y if high risk - diabetes, FH, AA and > 50 or hispanic and > 65) ASSESSMENT/PLAN: sees opthp      Medical nutritional therapy for individuals with diabetes or renal disease ASSESSMENT/PLAN:n/a      Cardiovascular screening blood tests (lipids q5y) ASSESSMENT/PLAN: today      Diabetes screening tests ASSESSMENT/PLAN: today   7.) Summary: -risk factors and conditions per above assessment were discussed and  treatment, recommendations and referrals were offered per documentation above and orders and patient instructions.  Essential hypertension, benign - Plan: Basic metabolic panel  Hx of TIA (transient ischemic attack)  - on asa  Unspecified hypothyroidism - Plan: TSH  History of colon cancer -had CT earlier this year, no colonoscopy advised, followed by GI  Encounter for Medicare annual wellness exam - Plan: Lipid Panel -see above  VURI: -likely per exam and symptoms, improving, return precuations, CXR if worsens or cough persists  Flu vaccine and prevnar 13 offered TSH, lipids, BMP  Patient Instructions  -We have ordered labs or studies at this visit. It can take up to 1-2 weeks for results and processing. We will contact you with instructions IF your results are abnormal. Normal results will be released to your [facility]. If you have not heard from [place] or can not find your results in [facility] in 2 weeks please contact our office.   We recommend the  following healthy lifestyle measures: - eat a healthy diet consisting of lots of vegetables, fruits, beans, nuts, seeds, healthy meats such as white chicken and fish and whole grains.  - avoid fried foods, fast food, processed foods, sodas, red meet and other fattening foods.  - get a least 150 minutes of aerobic exercise per week.   Follow up in: 6 months

## 2014-02-05 NOTE — Addendum Note (Signed)
Addended by: Agnes Lawrence on: 02/05/2014 04:47 PM   Modules accepted: Orders

## 2014-02-05 NOTE — Progress Notes (Signed)
Pre visit review using our clinic review tool, if applicable. No additional management support is needed unless otherwise documented below in the visit note. 

## 2014-03-22 ENCOUNTER — Other Ambulatory Visit (INDEPENDENT_AMBULATORY_CARE_PROVIDER_SITE_OTHER): Payer: Medicare PPO

## 2014-03-22 ENCOUNTER — Other Ambulatory Visit: Payer: Self-pay | Admitting: *Deleted

## 2014-03-22 DIAGNOSIS — E039 Hypothyroidism, unspecified: Secondary | ICD-10-CM

## 2014-03-22 LAB — TSH: TSH: 5.59 u[IU]/mL — ABNORMAL HIGH (ref 0.35–4.50)

## 2014-03-25 ENCOUNTER — Telehealth: Payer: Self-pay | Admitting: Family Medicine

## 2014-03-25 NOTE — Telephone Encounter (Signed)
[phone] (home)  Discussed her medications and recs.

## 2014-03-25 NOTE — Telephone Encounter (Signed)
Dr [name] could you please call the pt.?I  called the pt today and she was given the test results and instructions on how to take the thyroid medication again but she is still concerned about this as she feels tired and does not understand how taking 2 pills on Saturday will help.

## 2014-03-25 NOTE — Telephone Encounter (Signed)
Pt called and would like a call back. She said she is not understanding the last orders given to her about her med change

## 2014-05-21 ENCOUNTER — Other Ambulatory Visit: Payer: Medicare PPO

## 2014-05-28 ENCOUNTER — Other Ambulatory Visit (INDEPENDENT_AMBULATORY_CARE_PROVIDER_SITE_OTHER): Payer: Medicare Other

## 2014-05-28 DIAGNOSIS — E039 Hypothyroidism, unspecified: Secondary | ICD-10-CM

## 2014-05-28 LAB — TSH: TSH: 5.23 u[IU]/mL — ABNORMAL HIGH (ref 0.35–4.50)

## 2014-06-16 ENCOUNTER — Other Ambulatory Visit: Payer: Self-pay | Admitting: Family Medicine

## 2014-06-28 ENCOUNTER — Other Ambulatory Visit: Payer: Self-pay | Admitting: Family Medicine

## 2014-08-09 ENCOUNTER — Encounter: Payer: Self-pay | Admitting: Family Medicine

## 2014-08-09 ENCOUNTER — Ambulatory Visit (INDEPENDENT_AMBULATORY_CARE_PROVIDER_SITE_OTHER): Payer: Medicare Other | Admitting: Family Medicine

## 2014-08-09 VITALS — BP 150/78 | HR 82 | Temp 97.7°F | Ht 61.5 in | Wt 122.2 lb

## 2014-08-09 DIAGNOSIS — E039 Hypothyroidism, unspecified: Secondary | ICD-10-CM | POA: Diagnosis not present

## 2014-08-09 DIAGNOSIS — I10 Essential (primary) hypertension: Secondary | ICD-10-CM | POA: Diagnosis not present

## 2014-08-09 LAB — TSH: TSH: 4.42 u[IU]/mL (ref 0.35–4.50)

## 2014-08-09 NOTE — Progress Notes (Signed)
Pre visit review using our clinic review tool, if applicable. No additional management support is needed unless otherwise documented below in the visit note. 

## 2014-08-09 NOTE — Progress Notes (Signed)
HPI:  F/u:  Hypothyroid: -takes synthroid 68mcg 2 tablets Mondays and Fridays and 1 tablet other days -reports: doing well -denies: fatigue, wt changes  HTN: -meds: lisinopril 46m ASA -denies: CP, SOB, DOE, HA  Colon ca screening: saw GI, did not advise colonsocpy  Bowel changes: -ongoing for >1 year -very minor changes even per her report and intermittent -had eval with GI (she was not happy with the doctor) -she had CT and ca labs and all looked good and GI advised no furhter eval -she reports she is going to [facility] and will see her prior doctor there to see if anything further needed -denies: wt loss, blood in stools  ROS: See pertinent positives and negatives per HPI.  Past Medical History  Diagnosis Date  . Arthritis   . Hypertension   . Migraines   . History of blood transfusion   . Positive TB test   . Stroke 2002    ? TIA, brief episode of speech issues - evaluate [place]  . Endometrial cancer 1989    endometrial s/p hysterectomy, chemo and radiation  . Colon cancer 2009    s/p colon resection  . Pneumonia     Past Surgical History  Procedure Laterality Date  . Abdominal hysterectomy  1989  . Gallbladder surgery  1970  . Tonsillectomy and adenoidectomy  1943  . Subdural hematoma evacuation via craniotomy    . Colon resection  08/27/2007  . Appendectomy      Family History  Problem Relation Age of Onset  . Arthritis Mother   . Heart disease Father   . Hyperlipidemia Father   . Hypertension Father   . Diabetes Sister   . Crohn's disease Daughter     History   Social History  . Marital Status: Widowed    Spouse Name: N/A  . Number of Children: 2  . Years of Education: N/A   Occupational History  . retired    Social History Main Topics  . Smoking status: Never Smoker   . Smokeless tobacco: Never Used  . Alcohol Use: Yes     Comment: occ glass of wine   . Drug Use: No  . Sexual Activity: Not on file   Other Topics Concern  . 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  aspirin 325 MG tablet, Take 325 mg by mouth daily., Disp: , Rfl:  .  CALCIUM PO, Take 1 tablet by mouth daily. , Disp: , Rfl:  .  Cholecalciferol (VITAMIN D PO), Take 1 capsule by mouth daily. , Disp: , Rfl:  .  levothyroxine (SYNTHROID, LEVOTHROID) 50 MCG tablet, Take 2 on Monday and 2 on Friday and one all other days., Disp: 34 tablet, Rfl: 3 .  lisinopril (PRINIVIL,ZESTRIL) 20 MG tablet, TAKE 1 TABLET (20 MG TOTAL) BY MOUTH DAILY., Disp: 90 tablet, Rfl: 0 .  Multiple Vitamins-Minerals (OCUVITE ADULT 50+ PO), Take 1 tablet by mouth daily. , Disp: , Rfl:  .  Riboflavin (VITAMIN B-2 PO), Take 1 tablet by mouth daily. , Disp: , Rfl:  .  sodium chloride (OCEAN) 0.65 % nasal spray, Place 1 spray into the nose 2 (two) times daily as needed for congestion., Disp: ,  Rfl:  .  triamcinolone cream (KENALOG) 0.1 %, Apply 1 application topically 2 (two) times daily as needed (itching)., Disp: 30 g, Rfl: 0 .  vitamin C (ASCORBIC ACID) 500 MG tablet, Take 500 mg by mouth daily., Disp: , Rfl:   EXAM:  Filed Vitals:   08/09/14 1031  BP: 150/78  Pulse: 82  Temp: 97.7 F (36.5 C)    Body mass index is 22.72 kg/(m^2).  GENERAL: vitals reviewed and listed above, alert, oriented, appears well hydrated and in no acute distress  HEENT: atraumatic, conjunttiva clear, no obvious abnormalities on inspection of external nose and ears  NECK: no obvious masses on inspection  LUNGS: clear to auscultation bilaterally, no wheezes, rales or rhonchi, good air movement  CV: HRRR, no peripheral edema  MS: moves all extremities without noticeable abnormality  PSYCH: pleasant and cooperative, no obvious depression or anxiety  ASSESSMENT AND PLAN:  Discussed the following assessment  and plan:  Essential hypertension, benign  Hypothyroidism, unspecified hypothyroidism type - Plan: TSH  -check TSH -doing well -follow up in 6 months -she had neg extensive eval for bowel changes, she has opted to see her old GI for re-assurance as did not like the GI doctor she saw here - advised per my review of records it appears our gastroenterologist did a very thorough evaluation of her complaint and performed good medical care and I do not have further recommendations beyond their evaluation -Patient advised to return or notify a doctor immediately if symptoms worsen or persist or new concerns arise.  There are no Patient Instructions on file for this visit.   Colin Benton R.

## 2014-09-24 ENCOUNTER — Other Ambulatory Visit: Payer: Self-pay | Admitting: Family Medicine

## 2014-10-23 ENCOUNTER — Other Ambulatory Visit: Payer: Self-pay | Admitting: Family Medicine

## 2015-01-05 ENCOUNTER — Encounter: Payer: Self-pay | Admitting: Family Medicine

## 2015-01-05 ENCOUNTER — Ambulatory Visit (INDEPENDENT_AMBULATORY_CARE_PROVIDER_SITE_OTHER): Payer: Medicare Other | Admitting: Family Medicine

## 2015-01-05 VITALS — BP 136/70 | HR 87 | Temp 98.4°F | Ht 61.5 in | Wt 117.1 lb

## 2015-01-05 DIAGNOSIS — Z85038 Personal history of other malignant neoplasm of large intestine: Secondary | ICD-10-CM

## 2015-01-05 DIAGNOSIS — K5900 Constipation, unspecified: Secondary | ICD-10-CM

## 2015-01-05 DIAGNOSIS — E039 Hypothyroidism, unspecified: Secondary | ICD-10-CM | POA: Diagnosis not present

## 2015-01-05 DIAGNOSIS — R194 Change in bowel habit: Secondary | ICD-10-CM | POA: Diagnosis not present

## 2015-01-05 LAB — TSH: TSH: 1.77 u[IU]/mL (ref 0.35–4.50)

## 2015-01-05 NOTE — Patient Instructions (Signed)
BEFORE YOU LEAVE: -lab to check thryoid -follow up in 3 months  Fiber supplement (metameucil or citracel) every morning in liquid per instruction  If 3 days without bowel movement or hard stools or getting stopped up start mirilax daily until soft stools and cleared out  If any issues despite this persist advise you follow up with your gastroenterologist  Lease make sure you are eating protein at every meal - dairy, meats, fish, nuts and seeds

## 2015-01-05 NOTE — Progress Notes (Signed)
HPI:  Lori [name] is a 81 yo F with PMH sig for colon ca s/p resection remotely. She has anxiety regarding recurrence and thus frequently comes in for very minor GI distress. I have instructed her to follow up with her gastroenterologist with such complaints, but she was not happy that they refused to repeat a colonoscopy on her last year. They did do labs including CEA which were normal and a CT scan, also normal. At her last visit she reported she was going to pursue a second opinion in [facility] with her prior GI doc.Today she reports: occ irregularity in the bowels persists, occ constipation - one episode that she can think of when did not have BM for several days and occassional harder stools - has not needed a laxative, has been avoiding protein and dairy and has been eating more vegetables and has lost a few lbs Denies: malaise, fevers, diarrhea, vomiting, nausea, blood in stools, shoe string stools, abd or pelvic pain  ROS: See pertinent positives and negatives per HPI.  Past Medical History  Diagnosis Date  . Arthritis   . Hypertension   . Migraines   . History of blood transfusion   . Positive TB test   . Stroke 2002    ? TIA, brief episode of speech issues - evaluate [place]  . Endometrial cancer 1989    endometrial s/p hysterectomy, chemo and radiation  . Colon cancer 2009    s/p colon resection  . Pneumonia     Past Surgical History  Procedure Laterality Date  . Abdominal hysterectomy  1989  . Gallbladder surgery  1970  . Tonsillectomy and adenoidectomy  1943  . Subdural hematoma evacuation via craniotomy    . Colon resection  08/27/2007  . Appendectomy      Family History  Problem Relation Age of Onset  . Arthritis Mother   . Heart disease Father   . Hyperlipidemia Father   . Hypertension Father   . Diabetes Sister   . Crohn's disease Daughter     Social History   Social History  . Marital Status: Widowed    Spouse Name: N/A  . Number of Children: 2  . Years of  Education: N/A   Occupational History  . retired    Social History Main Topics  . Smoking status: Never Smoker   . Smokeless tobacco: Never Used  . Alcohol Use: Yes     Comment: occ glass of wine   . Drug Use: No  . Sexual Activity: Not Asked   Other Topics Concern  . 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  aspirin 325 MG tablet, Take 325 mg by mouth daily., Disp: , Rfl:  .  Cholecalciferol (VITAMIN D PO), Take 1 capsule by mouth daily. , Disp: , Rfl:  .  levothyroxine (SYNTHROID, LEVOTHROID) 50 MCG tablet, TAKE 2 ON MONDAY AND 2 ON FRIDAY AND ONE ALL OTHER DAYS., Disp: 34 tablet, Rfl: 3 .  lisinopril (PRINIVIL,ZESTRIL) 20 MG tablet, TAKE 1 TABLET (20 MG TOTAL) BY MOUTH DAILY., Disp: 90 tablet, Rfl: 1 .  Multiple Vitamins-Minerals (EYE VITAMINS PO), Take by mouth daily., Disp: , Rfl:  .  Riboflavin (VITAMIN B-2 PO),  Take 1 tablet by mouth daily. , Disp: , Rfl:  .  sodium chloride (OCEAN) 0.65 % nasal spray, Place 1 spray into the nose 2 (two) times daily as needed for congestion., Disp: , Rfl:  .  triamcinolone cream (KENALOG) 0.1 %, Apply 1 application topically 2 (two) times daily as needed (itching)., Disp: 30 g, Rfl: 0 .  vitamin C (ASCORBIC ACID) 500 MG tablet, Take 500 mg by mouth daily., Disp: , Rfl:   EXAM:  Filed Vitals:   01/05/15 1011  BP: 136/70  Pulse: 87  Temp: 98.4 F (36.9 C)    Body mass index is 21.77 kg/(m^2).  GENERAL: vitals reviewed and listed above, alert, oriented, appears well hydrated and in no acute distress  HEENT: atraumatic, conjunttiva clear, no obvious abnormalities on inspection of external nose and ears  NECK: no obvious masses on inspection  LUNGS: clear to auscultation bilaterally, no wheezes, rales or rhonchi, good air  movement  CV: HRRR, no peripheral edema  ABD: BS+, soft, NTTP  MS: moves all extremities without noticeable abnormality  PSYCH: pleasant and cooperative, no obvious depression or anxiety  ASSESSMENT AND PLAN:  Discussed the following assessment and plan:  Constipation, unspecified constipation type  History of colon cancer  Change in bowel habits  Hypothyroidism, unspecified hypothyroidism type  -mild constipation, work up with GI last year for this with neg CEA and CT with contrast -diet changes and restriction likely contributing to minimal weight loss - advised protein at every meal -check thyroid, OTC options for mild constipation, follow up with GI if weight loss or bowel issues persist -follow up here in 3 months -Patient advised to return or notify a doctor immediately if symptoms worsen or persist or new concerns arise.  Patient Instructions  BEFORE YOU LEAVE: -lab to check thryoid -follow up in 3 months  Fiber supplement (metameucil or citracel) every morning in liquid per instruction  If 3 days without bowel movement or hard stools or getting stopped up start mirilax daily until soft stools and cleared out  If any issues despite this persist advise you follow up with your gastroenterologist  Lease make sure you are eating protein at every meal - dairy, meats, fish, nuts and seeds       Gayle Collard R.

## 2015-01-05 NOTE — Progress Notes (Signed)
Pre visit review using our clinic review tool, if applicable. No additional management support is needed unless otherwise documented below in the visit note. 

## 2015-01-31 DIAGNOSIS — H3531 Nonexudative age-related macular degeneration: Secondary | ICD-10-CM | POA: Diagnosis not present

## 2015-01-31 DIAGNOSIS — Z961 Presence of intraocular lens: Secondary | ICD-10-CM | POA: Diagnosis not present

## 2015-02-10 ENCOUNTER — Ambulatory Visit: Payer: Medicare Other | Admitting: Family Medicine

## 2015-02-17 ENCOUNTER — Other Ambulatory Visit: Payer: Self-pay | Admitting: Family Medicine

## 2015-03-10 ENCOUNTER — Ambulatory Visit (INDEPENDENT_AMBULATORY_CARE_PROVIDER_SITE_OTHER): Payer: Medicare Other | Admitting: *Deleted

## 2015-03-10 DIAGNOSIS — Z23 Encounter for immunization: Secondary | ICD-10-CM | POA: Diagnosis not present

## 2015-03-31 ENCOUNTER — Other Ambulatory Visit: Payer: Self-pay | Admitting: Family Medicine

## 2015-04-11 ENCOUNTER — Encounter: Payer: Self-pay | Admitting: Family Medicine

## 2015-04-11 ENCOUNTER — Ambulatory Visit (INDEPENDENT_AMBULATORY_CARE_PROVIDER_SITE_OTHER): Payer: Medicare Other | Admitting: Family Medicine

## 2015-04-11 VITALS — BP 118/78 | HR 82 | Temp 97.4°F | Ht 61.5 in | Wt 120.7 lb

## 2015-04-11 DIAGNOSIS — I10 Essential (primary) hypertension: Secondary | ICD-10-CM

## 2015-04-11 DIAGNOSIS — E039 Hypothyroidism, unspecified: Secondary | ICD-10-CM

## 2015-04-11 DIAGNOSIS — R194 Change in bowel habit: Secondary | ICD-10-CM

## 2015-04-11 NOTE — Patient Instructions (Signed)
Follow up in 4 months  We recommend the following healthy lifestyle measures: - eat a healthy whole foods diet consisting of regular small meals composed of vegetables, fruits, beans, nuts, seeds, healthy meats such as white chicken and fish and whole grains.  - avoid sweets, white starchy foods, fried foods, fast food, processed foods, sodas, red meet and other fattening foods.  - get a least 150-300 minutes of aerobic exercise per week.

## 2015-04-11 NOTE — Progress Notes (Signed)
Pre visit review using our clinic review tool, if applicable. No additional management support is needed unless otherwise documented below in the visit note. 

## 2015-04-11 NOTE — Progress Notes (Signed)
HPI:  Hypothyroid: -takes synthroid 68mcg 2 tablets Mondays and Fridays and 1 tablet other days -reports: doing well -denies: fatigue, wt changes  HTN: -meds: lisinopril 82m ASA -denies: CP, SOB, DOE, HA  Bowel changes: -ongoing for >1 year, mild constipation at times -very minor changes even per her report and intermittent  -had eval with GI in 2015 (she was not happy with the doctor) -she had CT and ca labs and all looked good and GI advised no furhter eval and specifically no colonoscopy given age/risks -she reports today things are doing better with raisin bran - this controls her constipation - but is going to see Dr. Jason Coop next month -denies: wt loss, blood in stools  ROS: See pertinent positives and negatives per HPI.  Past Medical History  Diagnosis Date  . Arthritis   . Hypertension   . Migraines   . History of blood transfusion   . Positive TB test   . Stroke [facility]) 2002    ? TIA, brief episode of speech issues - evaluate [place]  . Endometrial cancer ([place]) 1989    endometrial s/p hysterectomy, chemo and radiation  . Colon cancer ([place]) 2009    s/p colon resection  . Pneumonia     Past Surgical History  Procedure Laterality Date  . Abdominal hysterectomy  1989  . Gallbladder surgery  1970  . Tonsillectomy and adenoidectomy  1943  . Subdural hematoma evacuation via craniotomy    . Colon resection  08/27/2007  . Appendectomy      Family History  Problem Relation Age of Onset  . Arthritis Mother   . Heart disease Father   . Hyperlipidemia Father   . Hypertension Father   . Diabetes Sister   . Crohn's disease Daughter     Social History   Social History  . Marital Status: Widowed    Spouse Name: N/A  . Number of Children: 2  . Years of Education: N/A   Occupational History  . retired    Social History Main Topics  . Smoking status: Never Smoker   . Smokeless tobacco: Never Used  . Alcohol Use: Yes     Comment: occ glass of wine   . Drug Use:  No  . Sexual Activity: Not Asked   Other Topics Concern  . 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  aspirin 325 MG tablet, Take 325 mg by mouth daily., Disp: , Rfl:  .  Cholecalciferol (VITAMIN D PO), Take 1 capsule by mouth daily. , Disp: , Rfl:  .  levothyroxine (SYNTHROID, LEVOTHROID) 50 MCG tablet, TAKE 2 TABLETS ON MONDAY AND 2 TABLETS ON FRIDAY AND ONE TABLET ALL OTHER DAYS., Disp: 36 tablet, Rfl: 3 .  lisinopril (PRINIVIL,ZESTRIL) 20 MG tablet, TAKE 1 TABLET (20 MG TOTAL) BY MOUTH DAILY., Disp: 90 tablet, Rfl: 1 .  Multiple Vitamins-Minerals (EYE VITAMINS PO), Take by mouth daily., Disp: , Rfl:  .  Riboflavin (VITAMIN B-2 PO), Take 1 tablet by mouth daily. , Disp: , Rfl:  .  sodium chloride (OCEAN) 0.65 % nasal spray, Place 1 spray into the nose 2 (two) times daily as needed for congestion., Disp: , Rfl:  .  triamcinolone cream (  KENALOG) 0.1 %, Apply 1 application topically 2 (two) times daily as needed (itching)., Disp: 30 g, Rfl: 0 .  vitamin C (ASCORBIC ACID) 500 MG tablet, Take 500 mg by mouth daily., Disp: , Rfl:   EXAM:  Filed Vitals:   04/11/15 1059  BP: 118/78  Pulse: 82  Temp: 97.4 F (36.3 C)    Body mass index is 22.44 kg/(m^2).  GENERAL: vitals reviewed and listed above, alert, oriented, appears well hydrated and in no acute distress  HEENT: atraumatic, conjunttiva clear, no obvious abnormalities on inspection of external nose and ears  NECK: no obvious masses on inspection  LUNGS: clear to auscultation bilaterally, no wheezes, rales or rhonchi, good air movement  CV: HRRR, no peripheral edema  MS: moves all extremities without noticeable abnormality  PSYCH: pleasant and cooperative, no obvious depression or anxiety  ASSESSMENT AND PLAN:  Discussed  the following assessment and plan:  Essential hypertension, benign  Hypothyroidism, unspecified hypothyroidism type  Change in bowel habits  -BP great on recheck -glad bowel issues resolved, she is still planning to see GI and I am hopeful this can ease her anxiety -follow up in 4 months - labs then -Patient advised to return or notify a doctor immediately if symptoms worsen or persist or new concerns arise.  Patient Instructions  Follow up in 4 months  We recommend the following healthy lifestyle measures: - eat a healthy whole foods diet consisting of regular small meals composed of vegetables, fruits, beans, nuts, seeds, healthy meats such as white chicken and fish and whole grains.  - avoid sweets, white starchy foods, fried foods, fast food, processed foods, sodas, red meet and other fattening foods.  - get a least 150-300 minutes of aerobic exercise per week.       Colin Benton R.

## 2015-05-18 DIAGNOSIS — R159 Full incontinence of feces: Secondary | ICD-10-CM | POA: Diagnosis not present

## 2015-05-18 DIAGNOSIS — R198 Other specified symptoms and signs involving the digestive system and abdomen: Secondary | ICD-10-CM | POA: Diagnosis not present

## 2015-05-18 DIAGNOSIS — C189 Malignant neoplasm of colon, unspecified: Secondary | ICD-10-CM | POA: Diagnosis not present

## 2015-05-19 DIAGNOSIS — H35373 Puckering of macula, bilateral: Secondary | ICD-10-CM | POA: Diagnosis not present

## 2015-05-19 DIAGNOSIS — Z961 Presence of intraocular lens: Secondary | ICD-10-CM | POA: Diagnosis not present

## 2015-05-19 DIAGNOSIS — H353112 Nonexudative age-related macular degeneration, right eye, intermediate dry stage: Secondary | ICD-10-CM | POA: Diagnosis not present

## 2015-05-19 DIAGNOSIS — H353124 Nonexudative age-related macular degeneration, left eye, advanced atrophic with subfoveal involvement: Secondary | ICD-10-CM | POA: Diagnosis not present

## 2015-06-14 ENCOUNTER — Other Ambulatory Visit: Payer: Self-pay | Admitting: Family Medicine

## 2015-07-12 DIAGNOSIS — R198 Other specified symptoms and signs involving the digestive system and abdomen: Secondary | ICD-10-CM | POA: Diagnosis not present

## 2015-07-12 DIAGNOSIS — C189 Malignant neoplasm of colon, unspecified: Secondary | ICD-10-CM | POA: Diagnosis not present

## 2015-08-02 ENCOUNTER — Ambulatory Visit: Payer: Self-pay | Admitting: Family Medicine

## 2015-08-08 ENCOUNTER — Encounter: Payer: Self-pay | Admitting: Family Medicine

## 2015-08-08 ENCOUNTER — Ambulatory Visit (INDEPENDENT_AMBULATORY_CARE_PROVIDER_SITE_OTHER): Payer: Medicare Other | Admitting: Family Medicine

## 2015-08-08 VITALS — BP 142/72 | HR 79 | Temp 97.9°F | Ht 61.5 in | Wt 120.7 lb

## 2015-08-08 DIAGNOSIS — E039 Hypothyroidism, unspecified: Secondary | ICD-10-CM | POA: Diagnosis not present

## 2015-08-08 DIAGNOSIS — R194 Change in bowel habit: Secondary | ICD-10-CM

## 2015-08-08 DIAGNOSIS — I10 Essential (primary) hypertension: Secondary | ICD-10-CM

## 2015-08-08 LAB — BASIC METABOLIC PANEL
BUN: 26 mg/dL — ABNORMAL HIGH (ref 6–23)
CO2: 29 meq/L (ref 19–32)
Calcium: 9.5 mg/dL (ref 8.4–10.5)
Chloride: 104 mEq/L (ref 96–112)
Creatinine, Ser: 0.63 mg/dL (ref 0.40–1.20)
GFR: 93.29 mL/min (ref >60.00)
GLUCOSE: 84 mg/dL (ref 70–99)
POTASSIUM: 4.5 meq/L (ref 3.5–5.1)
SODIUM: 140 meq/L (ref 135–145)

## 2015-08-08 LAB — TSH: TSH: 2.41 u[IU]/mL (ref 0.35–4.50)

## 2015-08-08 NOTE — Progress Notes (Signed)
Pre visit review using our clinic review tool, if applicable. No additional management support is needed unless otherwise documented below in the visit note. 

## 2015-08-08 NOTE — Patient Instructions (Signed)
Before you leave: -Labs -Schedule follow-up in 6 months  -We have ordered labs or studies at this visit. It can take up to 1-2 weeks for results and processing. We will contact you with instructions IF your results are abnormal. Normal results will be released to your [facility]. If you have not heard from [place] or can not find your results in [facility] in 2 weeks please contact our office.  We recommend the following healthy lifestyle measures: - eat a healthy whole foods diet consisting of regular small meals composed of vegetables, fruits, beans, nuts, seeds, healthy meats such as white chicken and fish and whole grains.  - avoid sweets, white starchy foods, fried foods, fast food, processed foods, sodas, red meet and other fattening foods.  - get a least 150-300 minutes of aerobic exercise per week.

## 2015-08-08 NOTE — Progress Notes (Signed)
HPI:  Follow up visit for medication refills:  Hypothyroid: -takes synthroid 71mcg 2 tablets Mondays and Fridays and 1 tablet other days -reports: doing well -denies: fatigue, wt changes  HTN: -meds: lisinopril 20mg , ASA -denies: CP, SOB, DOE, HA  Chronic constipation: -sees GI -remains anxious about her bowels -wonders what to do about occ episodes of 2-3 days without a BM -on probiotic per GI recommendations  ROS: See pertinent positives and negatives per HPI.  Past Medical History  Diagnosis Date  . Arthritis   . Hypertension   . Migraines   . History of blood transfusion   . Positive TB test   . Stroke [facility]) 2002    ? TIA, brief episode of speech issues - evaluate [place]  . Endometrial cancer ([place]) 1989    endometrial s/p hysterectomy, chemo and radiation  . Colon cancer ([place]) 2009    s/p colon resection  . Pneumonia     Past Surgical History  Procedure Laterality Date  . Abdominal hysterectomy  1989  . Gallbladder surgery  1970  . Tonsillectomy and adenoidectomy  1943  . Subdural hematoma evacuation via craniotomy    . Colon resection  08/27/2007  . Appendectomy      Family History  Problem Relation Age of Onset  . Arthritis Mother   . Heart disease Father   . Hyperlipidemia Father   . Hypertension Father   . Diabetes Sister   . Crohn's disease Daughter     Social History   Social History  . Marital Status: Widowed    Spouse Name: N/A  . Number of Children: 2  . Years of Education: N/A   Occupational History  . retired    Social History Main Topics  . Smoking status: Never Smoker   . Smokeless tobacco: Never Used  . Alcohol Use: Yes     Comment: occ glass of wine   . Drug Use: No  . Sexual Activity: Not Asked   Other Topics Concern  . 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  aspirin 325 MG tablet, Take 325 mg by mouth daily., Disp: , Rfl:  .  Cholecalciferol (VITAMIN D PO), Take 1 capsule by mouth daily. , Disp: , Rfl:  .  levothyroxine (SYNTHROID, LEVOTHROID) 50 MCG tablet, TAKE 2 TABLETS ON MONDAY AND 2 TABLETS ON FRIDAY AND ONE TABLET ALL OTHER DAYS., Disp: 36 tablet, Rfl: 3 .  lisinopril (PRINIVIL,ZESTRIL) 20 MG tablet, TAKE 1 TABLET (20 MG TOTAL) BY MOUTH DAILY., Disp: 90 tablet, Rfl: 1 .  Multiple Vitamins-Minerals (EYE VITAMINS PO), Take by mouth daily. , Disp: , Rfl:  .  Riboflavin (VITAMIN B-2 PO), Take 1 tablet by mouth daily. , Disp: , Rfl:  .  sodium chloride (OCEAN) 0.65 % nasal spray, Place 1 spray into the nose 2 (two) times daily as needed for congestion., Disp: , Rfl:  .  triamcinolone cream (KENALOG) 0.1 %, Apply 1 application topically 2 (two) times daily as needed (itching)., Disp: 30 g, Rfl: 0 .  vitamin C (ASCORBIC ACID) 500 MG tablet, Take 500 mg by mouth daily., Disp: , Rfl:   EXAM:  Filed Vitals:   08/08/15 1107  BP: 142/72  Pulse: 79  Temp: 97.9 F (36.6 C)    Body mass index is 22.44 kg/(m^2).  GENERAL: vitals reviewed and listed above, alert, oriented, appears well hydrated and in no acute distress  HEENT: atraumatic, conjunttiva clear, no obvious abnormalities on inspection of external nose and ears  NECK: no obvious masses on inspection  LUNGS: clear to auscultation bilaterally, no wheezes, rales or rhonchi, good air movement  CV: HRRR, no peripheral edema  MS: moves all extremities without noticeable abnormality  PSYCH: pleasant and cooperative, no obvious depression or anxiety  ASSESSMENT AND PLAN:  Discussed the following assessment and plan:  Essential hypertension, benign - Plan: Basic metabolic panel  Hypothyroidism, unspecified hypothyroidism type - Plan: TSH  Change in bowel habits -labs -suggested mirilax prn for constipation and follow up with her  gastroenterologist if needed -continue current medications -Patient advised to return or notify a doctor immediately if symptoms worsen or persist or new concerns arise.  Patient Instructions  Before you leave: -Labs -Schedule follow-up in 6 months  -We have ordered labs or studies at this visit. It can take up to 1-2 weeks for results and processing. We will contact you with instructions IF your results are abnormal. Normal results will be released to your [facility]. If you have not heard from [place] or can not find your results in [facility] in 2 weeks please contact our office.  We recommend the following healthy lifestyle measures: - eat a healthy whole foods diet consisting of regular small meals composed of vegetables, fruits, beans, nuts, seeds, healthy meats such as white chicken and fish and whole grains.  - avoid sweets, white starchy foods, fried foods, fast food, processed foods, sodas, red meet and other fattening foods.  - get a least 150-300 minutes of aerobic exercise per week.            Colin Benton R.  Due for day

## 2015-08-10 ENCOUNTER — Encounter: Payer: Self-pay | Admitting: *Deleted

## 2015-09-12 ENCOUNTER — Other Ambulatory Visit: Payer: Self-pay | Admitting: *Deleted

## 2015-09-12 MED ORDER — LEVOTHYROXINE SODIUM 50 MCG PO TABS: ORAL_TABLET | ORAL | Status: DC

## 2015-09-25 ENCOUNTER — Other Ambulatory Visit: Payer: Self-pay | Admitting: Family Medicine

## 2015-10-03 ENCOUNTER — Telehealth: Payer: Self-pay | Admitting: Family Medicine

## 2015-10-03 NOTE — Telephone Encounter (Signed)
Pt stated that Dr. Sharlene Motts state that she needs to get her ears cleaned out before she can get a better hearing aid.

## 2015-10-03 NOTE — Telephone Encounter (Signed)
Please help her schedule appointment.

## 2015-10-04 NOTE — Telephone Encounter (Signed)
I called the pt and scheduled an appt for 5/25 at 9:15am.

## 2015-10-06 ENCOUNTER — Ambulatory Visit (INDEPENDENT_AMBULATORY_CARE_PROVIDER_SITE_OTHER): Payer: Medicare Other | Admitting: Family Medicine

## 2015-10-06 ENCOUNTER — Encounter: Payer: Self-pay | Admitting: Family Medicine

## 2015-10-06 VITALS — BP 130/78 | HR 79 | Temp 98.5°F | Ht 61.5 in | Wt 117.3 lb

## 2015-10-06 DIAGNOSIS — H6123 Impacted cerumen, bilateral: Secondary | ICD-10-CM | POA: Diagnosis not present

## 2015-10-06 NOTE — Progress Notes (Signed)
HPI:  Acute visit for:  Cerumen Impaction: -reports told by her audiologist last week has ear wax and desires removal -denies: pain, drainage from ears, qtips in ears  ROS: See pertinent positives and negatives per HPI.  Past Medical History  Diagnosis Date  . Arthritis   . Hypertension   . Migraines   . History of blood transfusion   . Positive TB test   . Stroke [facility]) 2002    ? TIA, brief episode of speech issues - evaluate [place]  . Endometrial cancer ([place]) 1989    endometrial s/p hysterectomy, chemo and radiation  . Colon cancer ([place]) 2009    s/p colon resection  . Pneumonia     Past Surgical History  Procedure Laterality Date  . Abdominal hysterectomy  1989  . Gallbladder surgery  1970  . Tonsillectomy and adenoidectomy  1943  . Subdural hematoma evacuation via craniotomy    . Colon resection  08/27/2007  . Appendectomy      Family History  Problem Relation Age of Onset  . Arthritis Mother   . Heart disease Father   . Hyperlipidemia Father   . Hypertension Father   . Diabetes Sister   . Crohn's disease Daughter     Social History   Social History  . Marital Status: Widowed    Spouse Name: N/A  . Number of Children: 2  . Years of Education: N/A   Occupational History  . retired    Social History Main Topics  . Smoking status: Never Smoker   . Smokeless tobacco: Never Used  . Alcohol Use: Yes     Comment: occ glass of wine   . Drug Use: No  . Sexual Activity: Not Asked   Other Topics Concern  . 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  aspirin 325 MG tablet, Take 325 mg by mouth daily., Disp: , Rfl:  .  Cholecalciferol (VITAMIN D PO), Take 1 capsule by mouth daily. , Disp: , Rfl:  .  levothyroxine (SYNTHROID, LEVOTHROID) 50 MCG tablet, TAKE 2  TABLETS ON MONDAY AND 2 TABLETS ON FRIDAY AND ONE TABLET ALL OTHER DAYS., Disp: 36 tablet, Rfl: 3 .  lisinopril (PRINIVIL,ZESTRIL) 20 MG tablet, TAKE 1 TABLET (20 MG TOTAL) BY MOUTH DAILY., Disp: 90 tablet, Rfl: 1 .  Multiple Vitamins-Minerals (EYE VITAMINS PO), Take by mouth daily. , Disp: , Rfl:  .  Riboflavin (VITAMIN B-2 PO), Take 1 tablet by mouth daily. , Disp: , Rfl:  .  sodium chloride (OCEAN) 0.65 % nasal spray, Place 1 spray into the nose 2 (two) times daily as needed for congestion., Disp: , Rfl:  .  triamcinolone cream (KENALOG) 0.1 %, Apply 1 application topically 2 (two) times daily as needed (itching)., Disp: 30 g, Rfl: 0 .  vitamin C (ASCORBIC ACID) 500 MG tablet, Take 500 mg by mouth daily., Disp: , Rfl:   EXAM:  Filed Vitals:   10/06/15 0919  BP: 130/78  Pulse: 79  Temp: 98.5 F (36.9 C)    Body mass index is 21.81 kg/(m^2).  GENERAL: vitals reviewed and listed above, alert, oriented, appears well hydrated and in no acute distress  HEENT: atraumatic, conjunttiva clear, no  obvious abnormalities on inspection of external nose and ears, soft cerumen impaction bilateral ear canals  NECK: no obvious masses on inspection  MS: moves all extremities without noticeable abnormality  PSYCH: pleasant and cooperative, no obvious depression or anxiety  ASSESSMENT AND PLAN:  Discussed the following assessment and plan:  Cerumen impaction, bilateral  -discussed treatment/removal options and risks -she opted for ear lavage  -tolerated well with resolution of issue -Patient advised to return or notify a doctor immediately if symptoms worsen or persist or new concerns arise.  There are no Patient Instructions on file for this visit.   Colin Benton R.

## 2015-10-06 NOTE — Progress Notes (Signed)
Pre visit review using our clinic review tool, if applicable. No additional management support is needed unless otherwise documented below in the visit note. 

## 2015-11-05 ENCOUNTER — Other Ambulatory Visit: Payer: Self-pay | Admitting: Family Medicine

## 2015-11-07 ENCOUNTER — Telehealth: Payer: Self-pay | Admitting: Family Medicine

## 2015-11-07 MED ORDER — LEVOTHYROXINE SODIUM 50 MCG PO TABS: ORAL_TABLET | ORAL | Status: DC

## 2015-11-07 NOTE — Telephone Encounter (Signed)
Rx done. 

## 2015-11-07 NOTE — Telephone Encounter (Signed)
Pt need a Rx for levothyroxine 3 mos (90 day supply).   Pharm:  [facility].

## 2015-11-08 ENCOUNTER — Other Ambulatory Visit: Payer: Self-pay | Admitting: General Practice

## 2015-11-17 ENCOUNTER — Other Ambulatory Visit: Payer: Self-pay

## 2015-11-17 MED ORDER — LEVOTHYROXINE SODIUM 50 MCG PO TABS: ORAL_TABLET | ORAL | Status: DC

## 2016-01-12 ENCOUNTER — Other Ambulatory Visit: Payer: Self-pay

## 2016-02-06 ENCOUNTER — Encounter: Payer: Self-pay | Admitting: Family Medicine

## 2016-02-06 ENCOUNTER — Ambulatory Visit (INDEPENDENT_AMBULATORY_CARE_PROVIDER_SITE_OTHER): Payer: Medicare Other | Admitting: Family Medicine

## 2016-02-06 VITALS — BP 126/70 | HR 82 | Temp 98.1°F | Ht 61.5 in | Wt 113.8 lb

## 2016-02-06 DIAGNOSIS — E039 Hypothyroidism, unspecified: Secondary | ICD-10-CM | POA: Diagnosis not present

## 2016-02-06 DIAGNOSIS — L299 Pruritus, unspecified: Secondary | ICD-10-CM

## 2016-02-06 DIAGNOSIS — Z23 Encounter for immunization: Secondary | ICD-10-CM | POA: Diagnosis not present

## 2016-02-06 DIAGNOSIS — I10 Essential (primary) hypertension: Secondary | ICD-10-CM

## 2016-02-06 NOTE — Patient Instructions (Addendum)
BEFORE YOU LEAVE: -flu shot -follow up:  1) Medicare exam with [name] in 3 months 2) follow up with Dr. [name] in 6 months -labs  Monistat or clotrimazole cream for the itchy area (available over the counter)  We have ordered labs or studies at this visit. It can take up to 1-2 weeks for results and processing. IF results require follow up or explanation, we will call you with instructions. Clinically stable results will be released to your [facility]. If you have not heard from [place] or cannot find your results in [facility] in 2 weeks please contact our office at [phone].  If you are not yet signed up for [facility], please consider signing up.

## 2016-02-06 NOTE — Progress Notes (Signed)
HPI:  Hypothyroid: -takes synthroid 68mcg 2 tablets Mondays and Fridays and 1 tablet other days -reports: doing well -denies: fatigue, wt changes  HTN: -meds: lisinopril 20mg , ASA -denies: CP, SOB, DOE, HA  Vulvovaginal pruritis/urinary incontinence: -mild leaking of urine chronic - has chosen pads and this works well for her -occ vulvovag pruritis especially in the morning -no discharge, pain, rash, worsening urinary symptoms  Chronic constipation: -sees GI  ROS: See pertinent positives and negatives per HPI.  Past Medical History:  Diagnosis Date  . Arthritis   . Colon cancer ([place]) 2009   s/p colon resection  . Endometrial cancer ([place]) 1989   endometrial s/p hysterectomy, chemo and radiation  . History of blood transfusion   . Hypertension   . Migraines   . Pneumonia   . Positive TB test   . Stroke [facility]) 2002   ? TIA, brief episode of speech issues - evaluate OH    Past Surgical History:  Procedure Laterality Date  . ABDOMINAL HYSTERECTOMY  1989  . APPENDECTOMY    . COLON RESECTION  08/27/2007  . GALLBLADDER SURGERY  1970  . SUBDURAL HEMATOMA EVACUATION VIA CRANIOTOMY    . TONSILLECTOMY AND ADENOIDECTOMY  1943    Family History  Problem Relation Age of Onset  . Arthritis Mother   . Heart disease Father   . Hyperlipidemia Father   . Hypertension Father   . Diabetes Sister   . Crohn's disease Daughter     Social History   Social History  . Marital status: Widowed    Spouse name: N/A  . Number of children: 2  . Years of education: N/A   Occupational History  . retired    Social History Main Topics  . Smoking status: Never Smoker  . Smokeless tobacco: Never Used  . Alcohol use Yes     Comment: occ glass of wine   . Drug use: No  . Sexual activity: Not Asked   Other Topics Concern  . 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  aspirin 325 MG tablet, Take 325 mg by mouth daily., Disp: , Rfl:  .  Cholecalciferol (VITAMIN D PO), Take 1 capsule by mouth daily. , Disp: , Rfl:  .  levothyroxine (SYNTHROID, LEVOTHROID) 50 MCG tablet, TAKE 2 TABLETS ON MONDAY AND 2 TABLETS ON FRIDAY AND ONE TABLET ALL OTHER DAYS., Disp: 144 tablet, Rfl: 1 .  lisinopril (PRINIVIL,ZESTRIL) 20 MG tablet, TAKE 1 TABLET (20 MG TOTAL) BY MOUTH DAILY., Disp: 90 tablet, Rfl: 1 .  Multiple Vitamins-Minerals (EYE VITAMINS PO), Take by mouth daily. , Disp: , Rfl:  .  Riboflavin (VITAMIN B-2 PO), Take 1 tablet by mouth daily. , Disp: , Rfl:  .  sodium chloride (OCEAN) 0.65 % nasal spray, Place 1 spray into the nose 2 (two) times daily as needed for congestion., Disp: , Rfl:  .  triamcinolone cream (KENALOG) 0.1 %, Apply 1 application topically 2 (two) times daily as needed (itching)., Disp: 30 g, Rfl: 0 .  vitamin C (ASCORBIC ACID) 500 MG tablet, Take 500 mg by mouth daily., Disp: , Rfl:   EXAM:  Vitals:   02/06/16 1011  BP: 126/70  Pulse: 82  Temp: 98.1 F (36.7 C)    Body mass index is 21.15 kg/m.  GENERAL: vitals reviewed and listed above, alert, oriented, appears well hydrated and in no acute distress  HEENT: atraumatic, conjunttiva clear, no obvious abnormalities on inspection of external nose and ears  NECK: no obvious masses on inspection  LUNGS: clear to auscultation bilaterally, no wheezes, rales or rhonchi, good air movement  CV: HRRR, no peripheral edema  MS: moves all extremities without noticeable abnormality  PSYCH: pleasant and cooperative, no obvious depression or anxiety  ASSESSMENT AND PLAN:  Discussed the following assessment and plan:  Essential hypertension, benign - Plan: Basic metabolic panel, CBC (no diff)  Hypothyroidism, unspecified hypothyroidism type - Plan: TSH  Pruritus  -labs -flu shot offered -topical  yeast cream and cotton loose clothing for occ VV pruritis - advised follow up if persists -Medicare visit [name] 3 months -follow up me 6 moths -Patient advised to return or notify a doctor immediately if symptoms worsen or persist or new concerns arise.  Patient Instructions  BEFORE YOU LEAVE: -flu shot -follow up:  1) Medicare exam with [name] in 3 months 2) follow up with Dr. [name] in 6 months -labs  Monistat or clotrimazole cream for the itchy area (available over the counter)  We have ordered labs or studies at this visit. It can take up to 1-2 weeks for results and processing. IF results require follow up or explanation, we will call you with instructions. Clinically stable results will be released to your [facility]. If you have not heard from [place] or cannot find your results in [facility] in 2 weeks please contact our office at [phone].  If you are not yet signed up for [facility], please consider signing up.       3)Get at least 150 minutes of sweaty aerobic exercise per week.  4)Reduce stress - consider counseling, meditation and relaxation to balance other aspects of your life.     Colin Benton R., DO

## 2016-02-06 NOTE — Progress Notes (Signed)
Pre visit review using our clinic review tool, if applicable. No additional management support is needed unless otherwise documented below in the visit note. 

## 2016-02-14 ENCOUNTER — Telehealth: Payer: Self-pay | Admitting: *Deleted

## 2016-02-14 NOTE — Telephone Encounter (Signed)
I left a detailed message for the pt to call the office to schedule a lab visit to have the tests done or she could wait to have this done when she comes in for the wellness visit.

## 2016-02-14 NOTE — Telephone Encounter (Signed)
-----   Message from Lucretia Kern, DO sent at 02/14/2016  4:35 PM EDT ----- It looks like they may not have done labs? Can you check? If not see if can schedule lab visit or if want to do when come for wellness visit. Thanks! ----- Message ----- From: SYSTEM Sent: 02/11/2016  12:06 AM To: Lucretia Kern, DO

## 2016-02-23 DIAGNOSIS — H353112 Nonexudative age-related macular degeneration, right eye, intermediate dry stage: Secondary | ICD-10-CM | POA: Diagnosis not present

## 2016-02-23 DIAGNOSIS — H353124 Nonexudative age-related macular degeneration, left eye, advanced atrophic with subfoveal involvement: Secondary | ICD-10-CM | POA: Diagnosis not present

## 2016-02-24 ENCOUNTER — Other Ambulatory Visit: Payer: Medicare Other

## 2016-03-07 NOTE — Progress Notes (Deleted)
Subjective:   Lori [name] is a 82 y.o. female who presents for Medicare Annual (Subsequent) preventive examination.  The Patient was informed that the wellness visit is to identify future health risk and educate and initiate measures that can reduce risk for increased disease through the lifespan.    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   Preventive Screening -Counseling & Management  Colon cancer 2009;  Mammogram waived    Current smoking/ tobacco status Second Hand Smoke status; No Smokers in the home ETOH occasional wine  RISK FACTORS Regular exercise  Diet Fall risk  Mobility of Functional changes this year? Safety; community, wears sunscreen, safe place for firearms; Motor vehicle accidents;   Cardiac Risk Factors:  Advanced aged > 67 in men; >65 in women Hyperlipidemia- chol 133; Trig 84; HDL 68 and LDL 48  HTN Family History ( HD; hyperlipidemia; htn; DM )  Obesity/ neg  Depression Screen PhQ 2: negative  Activities of Daily Living - See functional screen   Hearing Difficulty:  Ophthalmology Exam:   Cognitive testing; Ad8 score; 0 or less than 2  MMSE deferred or completed if AD8 + 2 issues  Advanced Directives   List the name of Physicians or other Practitioners you currently use:   Immunization History  Administered Date(s) Administered  . Influenza, High Dose Seasonal PF 03/16/2011, 02/21/2012, 03/10/2015, 02/06/2016  . Influenza,inj,Quad PF,36+ Mos 01/30/2013, 02/05/2014  . Pneumococcal Conjugate-13 02/05/2014  . Pneumococcal Polysaccharide-23 05/14/2000, 01/30/2013  . Tdap 01/30/2013   Required Immunizations needed today  Screening test up to date or reviewed for plan of completion There are no preventive care reminders to display for this patient.   The  following information was reviewed  Allergies; Medications; Past Medical Hx; Problem list; Surgical hx; Family hx; Social Hx                      Objective:     Vitals: There were no vitals taken for this visit.  There is no height or weight on file to calculate BMI.   Tobacco History  Smoking Status  . Never Smoker  Smokeless Tobacco  . Never Used     Counseling given: Not Answered   Past Medical History:  Diagnosis Date  . Arthritis   . Colon cancer ([place]) 2009   s/p colon resection  . Endometrial cancer ([place]) 1989   endometrial s/p hysterectomy, chemo and radiation  . History of blood transfusion   . Hypertension   . Migraines   . Pneumonia   . Positive TB test   . Stroke [facility]) 2002   ? TIA, brief episode of speech issues - evaluate OH   Past Surgical History:  Procedure Laterality Date  . ABDOMINAL HYSTERECTOMY  1989  . APPENDECTOMY    . COLON RESECTION  08/27/2007  . GALLBLADDER SURGERY  1970  . SUBDURAL HEMATOMA EVACUATION VIA CRANIOTOMY    . TONSILLECTOMY AND ADENOIDECTOMY  1943   Family History  Problem Relation Age of Onset  . Arthritis Mother   . Heart disease Father   . Hyperlipidemia Father   . Hypertension Father   . Diabetes Sister   . Crohn's disease Daughter    History  Sexual Activity  . Sexual activity: Not on file    Outpatient Encounter Prescriptions as of 03/08/2016  Medication  Sig  . aspirin 325 MG tablet Take 325 mg by mouth daily.  . Cholecalciferol (VITAMIN D PO) Take 1 capsule by mouth daily.   Marland Kitchen levothyroxine (SYNTHROID, LEVOTHROID) 50 MCG tablet TAKE 2 TABLETS ON MONDAY AND 2 TABLETS ON FRIDAY AND ONE TABLET ALL OTHER DAYS.  Marland Kitchen lisinopril (PRINIVIL,ZESTRIL) 20 MG tablet TAKE 1 TABLET (20 MG TOTAL) BY MOUTH DAILY.  . Multiple Vitamins-Minerals (EYE VITAMINS PO) Take by mouth daily.   . Riboflavin (VITAMIN B-2 PO) Take 1 tablet by mouth daily.   . sodium chloride (OCEAN) 0.65 % nasal spray Place 1 spray into the  nose 2 (two) times daily as needed for congestion.  . triamcinolone cream (KENALOG) 0.1 % Apply 1 application topically 2 (two) times daily as needed (itching).  . vitamin C (ASCORBIC ACID) 500 MG tablet Take 500 mg by mouth daily.   No facility-administered encounter medications on file as of 03/08/2016.     Activities of Daily Living No flowsheet data found.  Patient Care Team: Lucretia Kern, DO as PCP - General (Family Medicine) Wallene Huh, DPM as Consulting Physician (Podiatry) Almedia Balls, MD as Attending Physician (Orthopedic Surgery)    Assessment:     ASSESSMENT INCLUDED:   Review for health history including a functional assessment, fall risk, depression screen, memory loss, vision and hearing screens; Was educated and referred as appropriate. See Plan   Psychosocial risk reviewed as stress; unresolved grief; pain; lack of support; lack of income to buy groceries, meds etc.  Behavioral risk addressed such as tobacco, ETOH; diet (metabolic syndrome) and exercise  Risk for independent living or long term plan   Risk for safety; Bathroom; community; firearms, sun protection; auto accidents   All immunizations and overdue screens were reviewed for a plan or follow-up.   Labs were reviewed in regard to Lipids and A1c if appropriate.   Discussed Recommended screenings and documented any personalized health advice and referrals for preventive counseling.  See AVS for patient instructions;    Exercise Activities and Dietary recommendations    Goals    None     Fall Risk Fall Risk  08/09/2014 01/30/2013 01/30/2013  Falls in the past year? No No No  Risk for fall due to : - - History of fall(s);Impaired balance/gait   Depression Screen PHQ 2/9 Scores 08/09/2014 01/30/2013 01/30/2013  PHQ - 2 Score 0 0 0     Cognitive Function        Immunization History  Administered Date(s) Administered  . Influenza, High Dose Seasonal PF 03/16/2011, 02/21/2012, 03/10/2015,  02/06/2016  . Influenza,inj,Quad PF,36+ Mos 01/30/2013, 02/05/2014  . Pneumococcal Conjugate-13 02/05/2014  . Pneumococcal Polysaccharide-23 05/14/2000, 01/30/2013  . Tdap 01/30/2013   Screening Tests Health Maintenance  Topic Date Due  . DEXA SCAN  08/09/2019 (Originally 06/09/1985)  . ZOSTAVAX  01/31/2023 (Originally 06/09/1980)  . TETANUS/TDAP  01/31/2023  . INFLUENZA VACCINE  Completed  . PNA vac Low Risk Adult  Completed      Plan:   During the course of the visit the patient was educated and counseled about the following appropriate screening and preventive services:   Vaccines to include Pneumoccal, Influenza, Hepatitis B, Td, Zostavax, HCV  Electrocardiogram  Cardiovascular Disease  Colorectal cancer screening  Bone density screening  Diabetes screening  Glaucoma screening  Mammography/PAP  Nutrition counseling   Patient Instructions (the written plan) was given to the patient.   Wynetta Fines, RN  03/07/2016

## 2016-03-08 ENCOUNTER — Ambulatory Visit: Payer: Medicare Other

## 2016-03-08 DIAGNOSIS — Z961 Presence of intraocular lens: Secondary | ICD-10-CM | POA: Diagnosis not present

## 2016-03-08 DIAGNOSIS — H52203 Unspecified astigmatism, bilateral: Secondary | ICD-10-CM | POA: Diagnosis not present

## 2016-03-09 ENCOUNTER — Ambulatory Visit (INDEPENDENT_AMBULATORY_CARE_PROVIDER_SITE_OTHER): Payer: Medicare Other

## 2016-03-09 VITALS — BP 152/70 | HR 86 | Ht 60.0 in | Wt 113.5 lb

## 2016-03-09 DIAGNOSIS — I10 Essential (primary) hypertension: Secondary | ICD-10-CM | POA: Diagnosis not present

## 2016-03-09 DIAGNOSIS — E039 Hypothyroidism, unspecified: Secondary | ICD-10-CM

## 2016-03-09 DIAGNOSIS — Z0000 Encounter for general adult medical examination without abnormal findings: Secondary | ICD-10-CM

## 2016-03-09 LAB — CBC WITH DIFFERENTIAL/PLATELET
BASOS PCT: 0 %
Basophils Absolute: 0 cells/uL (ref 0–200)
EOS ABS: 150 {cells}/uL (ref 15–500)
Eosinophils Relative: 2 %
HEMATOCRIT: 38.2 % (ref 35.0–45.0)
Hemoglobin: 12.7 g/dL (ref 11.7–15.5)
LYMPHS PCT: 24 %
Lymphs Abs: 1800 cells/uL (ref 850–3900)
MCH: 31.8 pg (ref 27.0–33.0)
MCHC: 33.2 g/dL (ref 32.0–36.0)
MCV: 95.5 fL (ref 80.0–100.0)
MONO ABS: 750 {cells}/uL (ref 200–950)
MPV: 9.7 fL (ref 7.5–12.5)
Monocytes Relative: 10 %
Neutro Abs: 4800 cells/uL (ref 1500–7800)
Neutrophils Relative %: 64 %
Platelets: 293 10*3/uL (ref 140–400)
RBC: 4 MIL/uL (ref 3.80–5.10)
RDW: 13 % (ref 11.0–15.0)
WBC: 7.5 10*3/uL (ref 3.8–10.8)

## 2016-03-09 LAB — TSH: TSH: 1.43 mIU/L

## 2016-03-09 NOTE — Progress Notes (Addendum)
Subjective:   Lori [name] is a 82 y.o. female who presents for Medicare Annual (Subsequent) preventive examination. The Patient was informed that the wellness visit is to identify future health risk and educate and initiate measures that can reduce risk for increased disease through the lifespan.    NO ROS; Medicare Wellness Visit  Describes health as good, fair or great? Good   Was the 5th of 8 children; all brothers and sisters are gone Still has 2 dtr;  2 grand dtr; one in 4th grade and has graduated to [employer] with Dtr in [place] to age in place there   Preventive Screening -Counseling & Management   Current smoking/ tobacco status/ no  ETOH; Occastional  RISK FACTORS Regular exercise  Does very little Thought of joining a fitness area; problems Is balance;  Had subdural hematoma; in remote past Does some chair exercises   Showed her some balance exercises to do with the wall for support;  Demonstrated and then watched her do them;  Can use soup cans with exercise upper body when in chair  Also getting up and down from a chair is exercise!  All very slow with good support as demonstrated   Diet Vegetable and fruits mostly  Has OJ in am; does like hamburgers; Would like more fresh fish  Dtr and her take  turns cooking and plan ahead    Fall risk due to balance issue; no falls  Is very careful Information given on fall prevention  Bathroom safe for am care; Can use the shower separately from the tub Safety; community, wears sunscreen, safe place for firearms; does not drive  Cardiac Risk Factors:  Advanced aged  >58 in women Hyperlipidemia: ratio 2 chol to HDL  Diabetes/ neg Family History Mother had OA; father had HD; hyperlipidemia; hTN; sister had dm BMI Normal; Weight stable    Depression Screen Will miss family at times PhQ 2: negative  Activities of Daily Living - See functional screen   Hearing Difficulty: does wear  hearing aids with good results  Ophthalmology Exam: Just had one yesterday; 10/26 Regular checks with Dr that is a retinal specialist Takes areds 2 po Just ordered new glasses  Mag Deg  Cognitive testing; Ad8 score; 0 or less than 2  MMSE deferred or completed if AD8 + 2 issues Missed apt yesterday but she remembers telling scheduler she could not come due to eye apt.   Advanced Directives  Yes; dtr lawyer and has completed   List the name of Physicians or other Practitioners you currently use:  Dr. [name]   Immunization History  Administered Date(s) Administered  . Influenza, High Dose Seasonal PF 03/16/2011, 02/21/2012, 03/10/2015, 02/06/2016  . Influenza,inj,Quad PF,36+ Mos 01/30/2013, 02/05/2014  . Pneumococcal Conjugate-13 02/05/2014  . Pneumococcal Polysaccharide-23 05/14/2000, 01/30/2013  . Tdap 01/30/2013     Screening test up to date or reviewed for plan of completion There are no preventive care reminders to display for this patient.        Objective:     Vitals: BP (!) 152/70   Pulse 86   Ht 5' (1.524 m)   Wt 113 lb 8 oz (51.5 kg)   SpO2 98%   BMI 22.17 kg/m   Body mass index is 22.17 kg/m.   Tobacco History  Smoking Status  . Never Smoker  Smokeless Tobacco  . Never Used     Counseling given: Yes   Past Medical History:  Diagnosis Date  .  Arthritis   . Colon cancer ([place]) 2009   s/p colon resection  . Endometrial cancer ([place]) 1989   endometrial s/p hysterectomy, chemo and radiation  . History of blood transfusion   . Hypertension   . Migraines   . Pneumonia   . Positive TB test   . Stroke [facility]) 2002   ? TIA, brief episode of speech issues - evaluate OH   Past Surgical History:  Procedure Laterality Date  . ABDOMINAL HYSTERECTOMY  1989  . APPENDECTOMY    . COLON RESECTION  08/27/2007  . GALLBLADDER SURGERY  1970  . SUBDURAL HEMATOMA EVACUATION VIA CRANIOTOMY    . TONSILLECTOMY AND ADENOIDECTOMY  1943   Family History  Problem  Relation Age of Onset  . Arthritis Mother   . Heart disease Father   . Hyperlipidemia Father   . Hypertension Father   . Diabetes Sister   . Crohn's disease Daughter    History  Sexual Activity  . Sexual activity: Not on file    Outpatient Encounter Prescriptions as of 03/09/2016  Medication Sig  . aspirin 325 MG tablet Take 325 mg by mouth daily.  . Cholecalciferol (VITAMIN D PO) Take 1 capsule by mouth daily.   Marland Kitchen levothyroxine (SYNTHROID, LEVOTHROID) 50 MCG tablet TAKE 2 TABLETS ON MONDAY AND 2 TABLETS ON FRIDAY AND ONE TABLET ALL OTHER DAYS.  Marland Kitchen lisinopril (PRINIVIL,ZESTRIL) 20 MG tablet TAKE 1 TABLET (20 MG TOTAL) BY MOUTH DAILY.  . Multiple Vitamins-Minerals (EYE VITAMINS PO) Take by mouth daily.   . Riboflavin (VITAMIN B-2 PO) Take 1 tablet by mouth daily.   . sodium chloride (OCEAN) 0.65 % nasal spray Place 1 spray into the nose 2 (two) times daily as needed for congestion.  . triamcinolone cream (KENALOG) 0.1 % Apply 1 application topically 2 (two) times daily as needed (itching).  . vitamin C (ASCORBIC ACID) 500 MG tablet Take 500 mg by mouth daily.   No facility-administered encounter medications on file as of 03/09/2016.     Activities of Daily Living In your present state of health, do you have any difficulty performing the following activities: 03/09/2016  Hearing? (No Data)  Vision? N  Difficulty concentrating or making decisions? N  Walking or climbing stairs? Y  Dressing or bathing? N  Doing errands, shopping? N  Preparing Food and eating ? N  Using the Toilet? N  In the past six months, have you accidently leaked urine? Y  Do you have problems with loss of bowel control? Y  Managing your Medications? N  Managing your Finances? N  Housekeeping or managing your Housekeeping? N  Some recent data might be hidden    Patient Care Team: Lucretia Kern, DO as PCP - General (Family Medicine) Wallene Huh, DPM as Consulting Physician (Podiatry) Almedia Balls, MD  as Attending Physician (Orthopedic Surgery)    Assessment:    Discussed Recommended screenings and documented any personalized health advice and referrals for preventive counseling.  See AVS for patient instructions;   Exercise Activities and Dietary recommendations Current Exercise Habits: Home exercise routine  Goals    . Exercise 150 minutes per week (moderate activity)          Works on balance;  Showed her some balance exercises to do with the wall for support;  Can use soup cans with exercise upper body Also getting up and down from a chair  All very slow with good support as demonstrated  Fall Risk Fall Risk  03/09/2016 08/09/2014 01/30/2013 01/30/2013  Falls in the past year? No No No No  Risk for fall due to : - - - History of fall(s);Impaired balance/gait   Depression Screen PHQ 2/9 Scores 03/09/2016 08/09/2014 01/30/2013 01/30/2013  PHQ - 2 Score 0 0 0 0     Cognitive Function MMSE - Mini Mental State Exam 03/09/2016  Not completed: (No Data)      Ad8 score is 0   Immunization History  Administered Date(s) Administered  . Influenza, High Dose Seasonal PF 03/16/2011, 02/21/2012, 03/10/2015, 02/06/2016  . Influenza,inj,Quad PF,36+ Mos 01/30/2013, 02/05/2014  . Pneumococcal Conjugate-13 02/05/2014  . Pneumococcal Polysaccharide-23 05/14/2000, 01/30/2013  . Tdap 01/30/2013   Screening Tests Health Maintenance  Topic Date Due  . DEXA SCAN  08/09/2019 (Originally 06/09/1985)  . ZOSTAVAX  01/31/2023 (Originally 06/09/1980)  . TETANUS/TDAP  01/31/2023  . INFLUENZA VACCINE  Completed  . PNA vac Low Risk Adult  Completed      Plan:   All preventive work screens reviewed and up to date   Worked on balance exercises and demonstrated exercises. Advised that if her balance for any reason gets worse, to make apt with Dr. [name] for possible PT intervention IS very cautious, especially outside;    During the course of the visit the patient was educated and  counseled about the following appropriate screening and preventive services:   Vaccines to include Pneumoccal, Influenza, Hepatitis B, Td, Zostavax, HCV  Electrocardiogram  Cardiovascular Disease;   Colorectal cancer screening  Bone density screening declines   Diabetes screening no  Glaucoma screening/no   Mammography/ declines   Nutrition counseling /weight stable; BMI wnl   Patient Instructions (the written plan) was given to the patient.   Wynetta Fines, RN  03/09/2016  Lucretia Kern., DO

## 2016-03-09 NOTE — Patient Instructions (Addendum)
Ms. Lori [name] , Thank you for taking time to come for your Medicare Wellness Visit. I appreciate your ongoing commitment to your health goals. Please review the following plan we discussed and let me know if I can assist you in the future.   Will work on balance with good support !   These are the goals we discussed: Goals    . Exercise 150 minutes per week (moderate activity)          Works on balance;  Showed her some balance exercises to do with the wall for support;  Can use soup cans with exercise upper body Also getting up and down from a chair  All very slow with good support as demonstrated          This is a list of the screening recommended for you and due dates:  Health Maintenance  Topic Date Due  . DEXA scan (bone density measurement)  08/09/2019*  . Shingles Vaccine  01/31/2023*  . Tetanus Vaccine  01/31/2023  . Flu Shot  Completed  . Pneumonia vaccines  Completed  *Topic was postponed. The date shown is not the original due date.        Fall Prevention in the Home  Falls can cause injuries. They can happen to people of all ages. There are many things you can do to make your home safe and to help prevent falls.  WHAT CAN I DO ON THE OUTSIDE OF MY HOME?  Regularly fix the edges of walkways and driveways and fix any cracks.  Remove anything that might make you trip as you walk through a door, such as a raised step or threshold.  Trim any bushes or trees on the path to your home.  Use bright outdoor lighting.  Clear any walking paths of anything that might make someone trip, such as rocks or tools.  Regularly check to see if handrails are loose or broken. Make sure that both sides of any steps have handrails.  Any raised decks and porches should have guardrails on the edges.  Have any leaves, snow, or ice cleared regularly.  Use sand or salt on walking paths during winter.  Clean up any spills in your garage right away. This includes oil or grease  spills. WHAT CAN I DO IN THE BATHROOM?   Use night lights.  Install grab bars by the toilet and in the tub and shower. Do not use towel bars as grab bars.  Use non-skid mats or decals in the tub or shower.  If you need to sit down in the shower, use a plastic, non-slip stool.  Keep the floor dry. Clean up any water that spills on the floor as soon as it happens.  Remove soap buildup in the tub or shower regularly.  Attach bath mats securely with double-sided non-slip rug tape.  Do not have throw rugs and other things on the floor that can make you trip. WHAT CAN I DO IN THE BEDROOM?  Use night lights.  Make sure that you have a light by your bed that is easy to reach.  Do not use any sheets or blankets that are too big for your bed. They should not hang down onto the floor.  Have a firm chair that has side arms. You can use this for support while you get dressed.  Do not have throw rugs and other things on the floor that can make you trip. WHAT CAN I DO IN THE KITCHEN?  Clean up  any spills right away.  Avoid walking on wet floors.  Keep items that you use a lot in easy-to-reach places.  If you need to reach something above you, use a strong step stool that has a grab bar.  Keep electrical cords out of the way.  Do not use floor polish or wax that makes floors slippery. If you must use wax, use non-skid floor wax.  Do not have throw rugs and other things on the floor that can make you trip. WHAT CAN I DO WITH MY STAIRS?  Do not leave any items on the stairs.  Make sure that there are handrails on both sides of the stairs and use them. Fix handrails that are broken or loose. Make sure that handrails are as long as the stairways.  Check any carpeting to make sure that it is firmly attached to the stairs. Fix any carpet that is loose or worn.  Avoid having throw rugs at the top or bottom of the stairs. If you do have throw rugs, attach them to the floor with carpet  tape.  Make sure that you have a light switch at the top of the stairs and the bottom of the stairs. If you do not have them, ask someone to add them for you. WHAT ELSE CAN I DO TO HELP PREVENT FALLS?  Wear shoes that:  Do not have high heels.  Have rubber bottoms.  Are comfortable and fit you well.  Are closed at the toe. Do not wear sandals.  If you use a stepladder:  Make sure that it is fully opened. Do not climb a closed stepladder.  Make sure that both sides of the stepladder are locked into place.  Ask someone to hold it for you, if possible.  Clearly mark and make sure that you can see:  Any grab bars or handrails.  First and last steps.  Where the edge of each step is.  Use tools that help you move around (mobility aids) if they are needed. These include:  Canes.  Walkers.  Scooters.  Crutches.  Turn on the lights when you go into a dark area. Replace any light bulbs as soon as they burn out.  Set up your furniture so you have a clear path. Avoid moving your furniture around.  If any of your floors are uneven, fix them.  If there are any pets around you, be aware of where they are.  Review your medicines with your doctor. Some medicines can make you feel dizzy. This can increase your chance of falling. Ask your doctor what other things that you can do to help prevent falls.   This information is not intended to replace advice given to you by your health care provider. Make sure you discuss any questions you have with your health care provider.   Document Released: 02/24/2009 Document Revised: 09/14/2014 Document Reviewed: 06/04/2014 Elsevier Interactive Patient Education 2016 [place] Maintenance, Female Adopting a healthy lifestyle and getting preventive care can go a long way to promote health and wellness. Talk with your health care provider about what schedule of regular examinations is right for you. This is a good chance for you  to check in with your provider about disease prevention and staying healthy. In between checkups, there are plenty of things you can do on your own. Experts have done a lot of research about which lifestyle changes and preventive measures are most likely to keep you healthy. Ask your health care provider  for more information. WEIGHT AND DIET  Eat a healthy diet  Be sure to include plenty of vegetables, fruits, low-fat dairy products, and lean protein.  Do not eat a lot of foods high in solid fats, added sugars, or salt.  Get regular exercise. This is one of the most important things you can do for your health.  Most adults should exercise for at least 150 minutes each week. The exercise should increase your heart rate and make you sweat (moderate-intensity exercise).  Most adults should also do strengthening exercises at least twice a week. This is in addition to the moderate-intensity exercise.  Maintain a healthy weight  Body mass index (BMI) is a measurement that can be used to identify possible weight problems. It estimates body fat based on height and weight. Your health care provider can help determine your BMI and help you achieve or maintain a healthy weight.  For females 41 years of age and older:   A BMI below 18.5 is considered underweight.  A BMI of 18.5 to 24.9 is normal.  A BMI of 25 to 29.9 is considered overweight.  A BMI of 30 and above is considered obese.  Watch levels of cholesterol and blood lipids  You should start having your blood tested for lipids and cholesterol at 80 years of age, then have this test every 5 years.  You may need to have your cholesterol levels checked more often if:  Your lipid or cholesterol levels are high.  You are older than 80 years of age.  You are at high risk for heart disease.  CANCER SCREENING   Lung Cancer  Lung cancer screening is recommended for adults 76-49 years old who are at high risk for lung cancer because  of a history of smoking.  A yearly low-dose CT scan of the lungs is recommended for people who:  Currently smoke.  Have quit within the past 15 years.  Have at least a 30-pack-year history of smoking. A pack year is smoking an average of one pack of cigarettes a day for 1 year.  Yearly screening should continue until it has been 15 years since you quit.  Yearly screening should stop if you develop a health problem that would prevent you from having lung cancer treatment.  Breast Cancer  Practice breast self-awareness. This means understanding how your breasts normally appear and feel.  It also means doing regular breast self-exams. Let your health care provider know about any changes, no matter how small.  If you are in your 20s or 30s, you should have a clinical breast exam (CBE) by a health care provider every 1-3 years as part of a regular health exam.  If you are 38 or older, have a CBE every year. Also consider having a breast X-ray (mammogram) every year.  If you have a family history of breast cancer, talk to your health care provider about genetic screening.  If you are at high risk for breast cancer, talk to your health care provider about having an MRI and a mammogram every year.  Breast cancer gene (BRCA) assessment is recommended for women who have family members with BRCA-related cancers. BRCA-related cancers include:  Breast.  Ovarian.  Tubal.  Peritoneal cancers.  Results of the assessment will determine the need for genetic counseling and BRCA1 and BRCA2 testing. Cervical Cancer Your health care provider may recommend that you be screened regularly for cancer of the pelvic organs (ovaries, uterus, and vagina). This screening involves a pelvic  examination, including checking for microscopic changes to the surface of your cervix (Pap test). You may be encouraged to have this screening done every 3 years, beginning at age 71.  For women ages 73-65, health care  providers may recommend pelvic exams and Pap testing every 3 years, or they may recommend the Pap and pelvic exam, combined with testing for human papilloma virus (HPV), every 5 years. Some types of HPV increase your risk of cervical cancer. Testing for HPV may also be done on women of any age with unclear Pap test results.  Other health care providers may not recommend any screening for nonpregnant women who are considered low risk for pelvic cancer and who do not have symptoms. Ask your health care provider if a screening pelvic exam is right for you.  If you have had past treatment for cervical cancer or a condition that could lead to cancer, you need Pap tests and screening for cancer for at least 20 years after your treatment. If Pap tests have been discontinued, your risk factors (such as having a new sexual partner) need to be reassessed to determine if screening should resume. Some women have medical problems that increase the chance of getting cervical cancer. In these cases, your health care provider may recommend more frequent screening and Pap tests. Colorectal Cancer  This type of cancer can be detected and often prevented.  Routine colorectal cancer screening usually begins at 80 years of age and continues through 80 years of age.  Your health care provider may recommend screening at an earlier age if you have risk factors for colon cancer.  Your health care provider may also recommend using home test kits to check for hidden blood in the stool.  A small camera at the end of a tube can be used to examine your colon directly (sigmoidoscopy or colonoscopy). This is done to check for the earliest forms of colorectal cancer.  Routine screening usually begins at age 54.  Direct examination of the colon should be repeated every 5-10 years through 80 years of age. However, you may need to be screened more often if early forms of precancerous polyps or small growths are found. Skin  Cancer  Check your skin from head to toe regularly.  Tell your health care provider about any new moles or changes in moles, especially if there is a change in a mole's shape or color.  Also tell your health care provider if you have a mole that is larger than the size of a pencil eraser.  Always use sunscreen. Apply sunscreen liberally and repeatedly throughout the day.  Protect yourself by wearing long sleeves, pants, a wide-brimmed hat, and sunglasses whenever you are outside. HEART DISEASE, DIABETES, AND HIGH BLOOD PRESSURE   High blood pressure causes heart disease and increases the risk of stroke. High blood pressure is more likely to develop in:  People who have blood pressure in the high end of the normal range (130-139/85-89 mm Hg).  People who are overweight or obese.  People who are African American.  If you are 3-22 years of age, have your blood pressure checked every 3-5 years. If you are 77 years of age or older, have your blood pressure checked every year. You should have your blood pressure measured twice--once when you are at a hospital or clinic, and once when you are not at a hospital or clinic. Record the average of the two measurements. To check your blood pressure when you are not at  a hospital or clinic, you can use:  An automated blood pressure machine at a pharmacy.  A home blood pressure monitor.  If you are between 63 years and 65 years old, ask your health care provider if you should take aspirin to prevent strokes.  Have regular diabetes screenings. This involves taking a blood sample to check your fasting blood sugar level.  If you are at a normal weight and have a low risk for diabetes, have this test once every three years after 80 years of age.  If you are overweight and have a high risk for diabetes, consider being tested at a younger age or more often. PREVENTING INFECTION  Hepatitis B  If you have a higher risk for hepatitis B, you should be  screened for this virus. You are considered at high risk for hepatitis B if:  You were born in a country where hepatitis B is common. Ask your health care provider which countries are considered high risk.  Your parents were born in a high-risk country, and you have not been immunized against hepatitis B (hepatitis B vaccine).  You have HIV or AIDS.  You use needles to inject street drugs.  You live with someone who has hepatitis B.  You have had sex with someone who has hepatitis B.  You get hemodialysis treatment.  You take certain medicines for conditions, including cancer, organ transplantation, and autoimmune conditions. Hepatitis C  Blood testing is recommended for:  Everyone born from [birth year] through 1963.  Anyone with known risk factors for hepatitis C. Sexually transmitted infections (STIs)  You should be screened for sexually transmitted infections (STIs) including gonorrhea and chlamydia if:  You are sexually active and are younger than 80 years of age.  You are older than 80 years of age and your health care provider tells you that you are at risk for this type of infection.  Your sexual activity has changed since you were last screened and you are at an increased risk for chlamydia or gonorrhea. Ask your health care provider if you are at risk.  If you do not have HIV, but are at risk, it may be recommended that you take a prescription medicine daily to prevent HIV infection. This is called pre-exposure prophylaxis (PrEP). You are considered at risk if:  You are sexually active and do not regularly use condoms or know the HIV status of your partner(s).  You take drugs by injection.  You are sexually active with a partner who has HIV. Talk with your health care provider about whether you are at high risk of being infected with HIV. If you choose to begin PrEP, you should first be tested for HIV. You should then be tested every 3 months for as long as you are taking  PrEP.  PREGNANCY   If you are premenopausal and you may become pregnant, ask your health care provider about preconception counseling.  If you may become pregnant, take 400 to 800 micrograms (mcg) of folic acid every day.  If you want to prevent pregnancy, talk to your health care provider about birth control (contraception). OSTEOPOROSIS AND MENOPAUSE   Osteoporosis is a disease in which the bones lose minerals and strength with aging. This can result in serious bone fractures. Your risk for osteoporosis can be identified using a bone density scan.  If you are 53 years of age or older, or if you are at risk for osteoporosis and fractures, ask your health care provider if you should  be screened.  Ask your health care provider whether you should take a calcium or vitamin D supplement to lower your risk for osteoporosis.  Menopause may have certain physical symptoms and risks.  Hormone replacement therapy may reduce some of these symptoms and risks. Talk to your health care provider about whether hormone replacement therapy is right for you.  HOME CARE INSTRUCTIONS   Schedule regular health, dental, and eye exams.  Stay current with your immunizations.   Do not use any tobacco products including cigarettes, chewing tobacco, or electronic cigarettes.  If you are pregnant, do not drink alcohol.  If you are breastfeeding, limit how much and how often you drink alcohol.  Limit alcohol intake to no more than 1 drink per day for nonpregnant women. One drink equals 12 ounces of beer, 5 ounces of wine, or 1 ounces of hard liquor.  Do not use street drugs.  Do not share needles.  Ask your health care provider for help if you need support or information about quitting drugs.  Tell your health care provider if you often feel depressed.  Tell your health care provider if you have ever been abused or do not feel safe at home.   This information is not intended to replace advice given  to you by your health care provider. Make sure you discuss any questions you have with your health care provider.   Document Released: 11/13/2010 Document Revised: 05/21/2014 Document Reviewed: 04/01/2013 Elsevier Interactive Patient Education [employer].

## 2016-03-10 LAB — BASIC METABOLIC PANEL
BUN: 19 mg/dL (ref 7–25)
CHLORIDE: 102 mmol/L (ref 98–110)
CO2: 26 mmol/L (ref 20–31)
CREATININE: 0.76 mg/dL (ref 0.60–0.88)
Calcium: 9.7 mg/dL (ref 8.6–10.4)
GLUCOSE: 94 mg/dL (ref 65–99)
Potassium: 4.1 mmol/L (ref 3.5–5.3)
Sodium: 139 mmol/L (ref 135–146)

## 2016-03-12 ENCOUNTER — Encounter: Payer: Self-pay | Admitting: *Deleted

## 2016-03-23 ENCOUNTER — Other Ambulatory Visit: Payer: Self-pay | Admitting: Family Medicine

## 2016-04-26 DIAGNOSIS — H353112 Nonexudative age-related macular degeneration, right eye, intermediate dry stage: Secondary | ICD-10-CM | POA: Diagnosis not present

## 2016-04-26 DIAGNOSIS — H353124 Nonexudative age-related macular degeneration, left eye, advanced atrophic with subfoveal involvement: Secondary | ICD-10-CM | POA: Diagnosis not present

## 2016-04-26 DIAGNOSIS — H209 Unspecified iridocyclitis: Secondary | ICD-10-CM | POA: Diagnosis not present

## 2016-04-26 DIAGNOSIS — H15101 Unspecified episcleritis, right eye: Secondary | ICD-10-CM | POA: Diagnosis not present

## 2016-05-03 ENCOUNTER — Ambulatory Visit: Payer: Medicare Other

## 2016-05-03 DIAGNOSIS — H353112 Nonexudative age-related macular degeneration, right eye, intermediate dry stage: Secondary | ICD-10-CM | POA: Diagnosis not present

## 2016-05-03 DIAGNOSIS — H209 Unspecified iridocyclitis: Secondary | ICD-10-CM | POA: Diagnosis not present

## 2016-05-03 DIAGNOSIS — H15101 Unspecified episcleritis, right eye: Secondary | ICD-10-CM | POA: Diagnosis not present

## 2016-05-03 DIAGNOSIS — H35373 Puckering of macula, bilateral: Secondary | ICD-10-CM | POA: Diagnosis not present

## 2016-05-31 ENCOUNTER — Other Ambulatory Visit: Payer: Self-pay | Admitting: Family Medicine

## 2016-08-02 DIAGNOSIS — H353112 Nonexudative age-related macular degeneration, right eye, intermediate dry stage: Secondary | ICD-10-CM | POA: Diagnosis not present

## 2016-08-02 DIAGNOSIS — H15101 Unspecified episcleritis, right eye: Secondary | ICD-10-CM | POA: Diagnosis not present

## 2016-08-02 DIAGNOSIS — H209 Unspecified iridocyclitis: Secondary | ICD-10-CM | POA: Diagnosis not present

## 2016-08-02 DIAGNOSIS — H35373 Puckering of macula, bilateral: Secondary | ICD-10-CM | POA: Diagnosis not present

## 2016-08-06 NOTE — Progress Notes (Addendum)
HPI:  Lori [name] is a pleasant 83 y.o. here for follow up. Chronic medical problems summarized below were reviewed for changes and stability and were updated as needed below. These issues and their treatment remain stable for the most part. Reports doing ok. Has lost several friends and family members and has so emotional sadness regarding this loss that she feel is fairly normal. No SI. Interested in counseling. Feels sugar helps her chronic constipation. Wonders about barrier creams she could use when has mild urinary leaking that causes mild pruritis. She wants a "real, complete physical exam." She did not feel that a wellness visit is adequate. She understands that insurance may not pay for this but wants to schedule anyways.. Denies CP, SOB, DOE, treatment intolerance or new symptoms. Last annual exam 02/2016. Due for labs.  Hypothyroid: -takes synthroid 68mcg 2 tablets Mondays and Fridays and 1 tablet other days -reports: doing well  HTN: -meds: lisinopril 20mg , ASA  Vulvovaginal pruritis/urinary incontinence: -mild leaking of urine chronic - has chosen pads and this works well for her -occ vulvovag pruritis especially in the morning -no discharge, pain, rash, worsening urinary symptoms  Chronic constipation: -sees GI  ROS: See pertinent positives and negatives per HPI.  Past Medical History:  Diagnosis Date  . Arthritis   . Colon cancer ([place]) 2009   s/p colon resection  . Endometrial cancer ([place]) 1989   endometrial s/p hysterectomy, chemo and radiation  . History of blood transfusion   . Hypertension   . Migraines   . Pneumonia   . Positive TB test   . Stroke [facility]) 2002   ? TIA, brief episode of speech issues - evaluate [place]    Past Surgical History:  Procedure Laterality Date  . ABDOMINAL HYSTERECTOMY  1989  . APPENDECTOMY    . COLON RESECTION  08/27/2007  . GALLBLADDER SURGERY  1970  . SUBDURAL HEMATOMA EVACUATION VIA CRANIOTOMY    . TONSILLECTOMY AND  ADENOIDECTOMY  1943    Family History  Problem Relation Age of Onset  . Arthritis Mother   . Heart disease Father   . Hyperlipidemia Father   . Hypertension Father   . Diabetes Sister   . Crohn's disease Daughter     Social History   Social History  . Marital status: Widowed    Spouse name: N/A  . Number of children: 2  . Years of education: N/A   Occupational History  . retired    Social History Main Topics  . Smoking status: Never Smoker  . Smokeless tobacco: Never Used  . Alcohol use Yes     Comment: occ glass of wine   . Drug use: No  . Sexual activity: Not Asked   Other Topics Concern  . 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  aspirin 325 MG tablet, Take 325 mg by mouth daily., Disp: , Rfl:  .  Cholecalciferol (VITAMIN D PO), Take 1 capsule by mouth daily. , Disp: , Rfl:  .  levothyroxine (SYNTHROID, LEVOTHROID) 50 MCG tablet, TAKE 2 TABLETS ON MONDAY AND FRIDAY. TAKE 1 TABLET ALL OTHER DAYS, Disp: 144 tablet, Rfl: 1 .  lisinopril (PRINIVIL,ZESTRIL) 20 MG tablet, TAKE 1 TABLET (  20 MG TOTAL) BY MOUTH DAILY., Disp: 90 tablet, Rfl: 2 .  Multiple Vitamins-Minerals (EYE VITAMINS PO), Take by mouth daily. , Disp: , Rfl:  .  Riboflavin (VITAMIN B-2 PO), Take 1 tablet by mouth daily. , Disp: , Rfl:  .  sodium chloride (OCEAN) 0.65 % nasal spray, Place 1 spray into the nose 2 (two) times daily as needed for congestion., Disp: , Rfl:  .  triamcinolone cream (KENALOG) 0.1 %, Apply 1 application topically 2 (two) times daily as needed (itching)., Disp: 30 g, Rfl: 0 .  vitamin C (ASCORBIC ACID) 500 MG tablet, Take 500 mg by mouth daily., Disp: , Rfl:   EXAM:  Vitals:   08/07/16 1020  BP: 120/80  Pulse: 83  Temp: 97.9 F (36.6 C)    Body mass index is 22.13  kg/m.  GENERAL: vitals reviewed and listed above, alert, oriented, appears well hydrated and in no acute distress  HEENT: atraumatic, conjunttiva clear, no obvious abnormalities on inspection of external nose and ears  NECK: no obvious masses on inspection  LUNGS: clear to auscultation bilaterally, no wheezes, rales or rhonchi, good air movement  CV: HRRR, no peripheral edema  MS: moves all extremities without noticeable abnormality  PSYCH: pleasant and cooperative, no obvious depression or anxiety  ASSESSMENT AND PLAN:  Discussed the following assessment and plan:  Essential hypertension, benign - Plan: Basic metabolic panel, CBC  Hypothyroidism, unspecified type - Plan: TSH  Bereavement  -options for counseling discussed and offered -labs -advised desitin prn for skin -trial metameucil rather then sugar per her preference -CPE per her preference to be scheduled -Patient advised to return or notify a doctor immediately if symptoms worsen or persist or new concerns arise.  Patient Instructions  BEFORE YOU LEAVE: -Bereavement counseling information -labs -follow up:  1) Physical Exam with Dr. [name] in 2-3 months or at patient convenience   Consider Bereavement counseling.  We have ordered labs or studies at this visit. It can take up to 1-2 weeks for results and processing. IF results require follow up or explanation, we will call you with instructions. Clinically stable results will be released to your [facility]. If you have not heard from [place] or cannot find your results in [facility] in 2 weeks please contact our office at [phone].  If you are not yet signed up for [facility], please consider signing up.           Colin Benton R., DO

## 2016-08-07 ENCOUNTER — Encounter: Payer: Self-pay | Admitting: Family Medicine

## 2016-08-07 ENCOUNTER — Ambulatory Visit (INDEPENDENT_AMBULATORY_CARE_PROVIDER_SITE_OTHER): Payer: Medicare Other | Admitting: Family Medicine

## 2016-08-07 VITALS — BP 120/80 | HR 83 | Temp 97.9°F | Ht 60.0 in | Wt 113.3 lb

## 2016-08-07 DIAGNOSIS — I10 Essential (primary) hypertension: Secondary | ICD-10-CM | POA: Diagnosis not present

## 2016-08-07 DIAGNOSIS — E039 Hypothyroidism, unspecified: Secondary | ICD-10-CM | POA: Diagnosis not present

## 2016-08-07 DIAGNOSIS — Z634 Disappearance and death of family member: Secondary | ICD-10-CM

## 2016-08-07 NOTE — Patient Instructions (Signed)
BEFORE YOU LEAVE: -Bereavement counseling information -labs -follow up:  1) Physical Exam with Dr. [name] in 2-3 months or at patient convenience   Consider Bereavement counseling.  We have ordered labs or studies at this visit. It can take up to 1-2 weeks for results and processing. IF results require follow up or explanation, we will call you with instructions. Clinically stable results will be released to your [facility]. If you have not heard from [place] or cannot find your results in [facility] in 2 weeks please contact our office at [phone].  If you are not yet signed up for [facility], please consider signing up.

## 2016-08-07 NOTE — Progress Notes (Signed)
Pre visit review using our clinic review tool, if applicable. No additional management support is needed unless otherwise documented below in the visit note. 

## 2016-08-21 DIAGNOSIS — M2012 Hallux valgus (acquired), left foot: Secondary | ICD-10-CM | POA: Diagnosis not present

## 2016-08-21 DIAGNOSIS — M21612 Bunion of left foot: Secondary | ICD-10-CM | POA: Diagnosis not present

## 2016-08-31 ENCOUNTER — Ambulatory Visit: Payer: Medicare Other | Admitting: Family Medicine

## 2016-09-03 ENCOUNTER — Encounter: Payer: Self-pay | Admitting: Family Medicine

## 2016-09-03 ENCOUNTER — Ambulatory Visit (INDEPENDENT_AMBULATORY_CARE_PROVIDER_SITE_OTHER): Payer: Medicare Other | Admitting: Family Medicine

## 2016-09-03 VITALS — BP 136/84 | HR 89 | Temp 97.4°F | Ht 60.0 in | Wt 112.0 lb

## 2016-09-03 DIAGNOSIS — E039 Hypothyroidism, unspecified: Secondary | ICD-10-CM

## 2016-09-03 DIAGNOSIS — Z0000 Encounter for general adult medical examination without abnormal findings: Secondary | ICD-10-CM | POA: Diagnosis not present

## 2016-09-03 DIAGNOSIS — E785 Hyperlipidemia, unspecified: Secondary | ICD-10-CM

## 2016-09-03 DIAGNOSIS — I10 Essential (primary) hypertension: Secondary | ICD-10-CM | POA: Diagnosis not present

## 2016-09-03 LAB — CBC
HCT: 40.1 % (ref 36.0–46.0)
Hemoglobin: 13.6 g/dL (ref 12.0–15.0)
MCHC: 33.8 g/dL (ref 30.0–36.0)
MCV: 95.3 fl (ref 78.0–100.0)
PLATELETS: 281 10*3/uL (ref 150.0–400.0)
RBC: 4.21 Mil/uL (ref 3.87–5.11)
RDW: 13.5 % (ref 11.5–15.5)
WBC: 5.3 10*3/uL (ref 4.0–10.5)

## 2016-09-03 LAB — TSH: TSH: 4.12 u[IU]/mL (ref 0.35–4.50)

## 2016-09-03 LAB — LIPID PANEL
CHOLESTEROL: 155 mg/dL (ref 0–200)
HDL: 91.1 mg/dL (ref >39.00)
LDL Cholesterol: 46 mg/dL (ref 0–99)
NonHDL: 63.87
Total CHOL/HDL Ratio: 2
Triglycerides: 88 mg/dL (ref 0.0–149.0)
VLDL: 17.6 mg/dL (ref 0.0–40.0)

## 2016-09-03 LAB — BASIC METABOLIC PANEL
BUN: 18 mg/dL (ref 6–23)
CO2: 29 meq/L (ref 19–32)
Calcium: 9.5 mg/dL (ref 8.4–10.5)
Chloride: 104 mEq/L (ref 96–112)
Creatinine, Ser: 0.65 mg/dL (ref 0.40–1.20)
GFR: 89.78 mL/min (ref >60.00)
GLUCOSE: 111 mg/dL — AB (ref 70–99)
POTASSIUM: 4.1 meq/L (ref 3.5–5.1)
SODIUM: 140 meq/L (ref 135–145)

## 2016-09-03 NOTE — Patient Instructions (Addendum)
BEFORE YOU LEAVE: -follow up: 3- 4 months -labs  We have ordered labs or studies at this visit. It can take up to 1-2 weeks for results and processing. IF results require follow up or explanation, we will call you with instructions. Clinically stable results will be released to your [facility]. If you have not heard from [place] or cannot find your results in [facility] in 2 weeks please contact our office at [phone].  If you are not yet signed up for [facility], please consider signing up.   Preventive Care 81 Years and Older, Female Preventive care refers to lifestyle choices and visits with your health care provider that can promote health and wellness. What does preventive care include?  A yearly check up with your doctor.  Dental exams once or twice a year.  Routine eye exams. Ask your health care provider how often you should have your eyes checked.  Personal lifestyle choices, including:  Daily care of your teeth and gums.  Regular physical activity.  Eating a healthy diet.  Avoiding tobacco and drug use.  Limiting alcohol use.  Practicing safe sex.  Taking low-dose aspirin every day.  Taking vitamin and mineral supplements as recommended by your health care provider. What happens during an annual well check? The services and screenings done by your health care provider during your annual well check will depend on your age, overall health, lifestyle risk factors, and family history of disease. Counseling  Your health care provider may ask you questions about your:  Alcohol use.  Tobacco use.  Drug use.  Emotional well-being.  Home and relationship well-being.  Sexual activity.  Eating habits.  History of falls.  Memory and ability to understand (cognition).  Work and work Statistician.  Reproductive health. Screening  You may have the following tests or measurements depending on your risk factors:  Height, weight, and BMI.  Blood pressure.  Lipid and  cholesterol levels. These may be checked every 5 years, or more frequently if you are over 27 years old.  Skin check.  Lung cancer screening. You may have this screening every year starting at age 60 if you have a 30-pack-year history of smoking and currently smoke or have quit within the past 15 years.  Fecal occult blood test (FOBT) of the stool. You may have this test every year starting at age 25.  Flexible sigmoidoscopy or colonoscopy. You may have a sigmoidoscopy every 5 years or a colonoscopy every 10 years starting at age 47.  Hepatitis C blood test.  Hepatitis B blood test.  Sexually transmitted disease (STD) testing.  Diabetes screening. This is done by checking your blood sugar (glucose) after you have not eaten for a while (fasting). You may have this done every 1-3 years.  Bone density scan. This is done to screen for osteoporosis. You may have this done starting at age 61.  Mammogram. This may be done every 1-2 years. Talk to your health care provider about how often you should have regular mammograms. Talk with your health care provider about your test results, treatment options, and if necessary, the need for more tests. Vaccines  Your health care provider may recommend certain vaccines, such as:  Influenza vaccine. This is recommended every year.  Tetanus, diphtheria, and acellular pertussis (Tdap, Td) vaccine. You may need a Td booster every 10 years.  Varicella vaccine. You may need this if you have not been vaccinated.  Zoster vaccine. You may need this after age 71.  Measles, mumps, and  rubella (MMR) vaccine. You may need at least one dose of MMR if you were born in 1957 or later. You may also need a second dose.  Pneumococcal 13-valent conjugate (PCV13) vaccine. One dose is recommended after age 69.  Pneumococcal polysaccharide (PPSV23) vaccine. One dose is recommended after age 54.  Meningococcal vaccine. You may need this if you have certain  conditions.  Hepatitis A vaccine. You may need this if you have certain conditions or if you travel or work in places where you may be exposed to hepatitis A.  Hepatitis B vaccine. You may need this if you have certain conditions or if you travel or work in places where you may be exposed to hepatitis B.  Haemophilus influenzae type b (Hib) vaccine. You may need this if you have certain conditions. Talk to your health care provider about which screenings and vaccines you need and how often you need them. This information is not intended to replace advice given to you by your health care provider. Make sure you discuss any questions you have with your health care provider. Document Released: 05/27/2015 Document Revised: 01/18/2016 Document Reviewed: 03/01/2015 Elsevier Interactive Patient Education  2017 [employer].

## 2016-09-03 NOTE — Progress Notes (Signed)
HPI:  Here for CPE:  -Concerns and/or follow up today: none PMH Hypothyroidism, HTN, Chronic constipation. Reports she is doing well. Denies depression.getting involved in church and church group for socialization. No falls. Sees eye doctor for regular eye checks. Fasting for labs. Wants CPE - knows insurance may not pay for it but insist on getting anyway. Already had AWV with [name] and preventive care measures addressed. Did not take BP medication this morning, BP a little up on arrival and improved on recheck.  -Diet: variety of foods, balance and well rounded  -Exercise: trying to walk on a regular basis  -Vaccines: See AWV  -Taking folic acid, vitamin D or calcium: yes, taking Vit D  -sexual activity: none  -wants STI testing (Hep C if born [birth year]): N/A  -FH breast, colon or ovarian ca: see FH Last mammogram: see AWV Last colon cancer screening: see AWV  -Alcohol, Tobacco, drug use: see social history  Review of Systems - no fevers, unintentional weight loss, vision loss, hearing loss, chest pain, sob, hemoptysis, melena, hematochezia, hematuria, genital discharge, changing or concerning skin lesions, bleeding, bruising, loc, thoughts of self harm or SI  Past Medical History:  Diagnosis Date  . Arthritis   . Colon cancer ([place]) 2009   s/p colon resection  . Endometrial cancer ([place]) 1989   endometrial s/p hysterectomy, chemo and radiation  . History of blood transfusion   . Hypertension   . Migraines   . Pneumonia   . Positive TB test   . Stroke [facility]) 2002   ? TIA, brief episode of speech issues - evaluate OH    Past Surgical History:  Procedure Laterality Date  . ABDOMINAL HYSTERECTOMY  1989  . APPENDECTOMY    . COLON RESECTION  08/27/2007  . GALLBLADDER SURGERY  1970  . SUBDURAL HEMATOMA EVACUATION VIA CRANIOTOMY    . TONSILLECTOMY AND ADENOIDECTOMY  1943    Family History  Problem Relation Age of Onset  . Arthritis Mother   . Heart disease Father     . Hyperlipidemia Father   . Hypertension Father   . Diabetes Sister   . Crohn's disease Daughter     Social History   Social History  . Marital status: Widowed    Spouse name: N/A  . Number of children: 2  . Years of education: N/A   Occupational History  . retired    Social History Main Topics  . Smoking status: Never Smoker  . Smokeless tobacco: Never Used  . Alcohol use Yes     Comment: occ glass of wine   . Drug use: No  . Sexual activity: Not Asked   Other Topics Concern  . 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  aspirin 325 MG tablet, Take 325 mg by mouth daily., Disp: , Rfl:  .  Cholecalciferol (VITAMIN D PO), Take 1 capsule by mouth daily. , Disp: , Rfl:  .  levothyroxine (SYNTHROID, LEVOTHROID) 50 MCG tablet, TAKE 2 TABLETS ON MONDAY AND FRIDAY. TAKE 1 TABLET ALL OTHER DAYS, Disp: 144 tablet, Rfl: 1 .  lisinopril (PRINIVIL,ZESTRIL) 20 MG tablet, TAKE 1 TABLET (20 MG TOTAL) BY MOUTH DAILY., Disp: 90 tablet, Rfl: 2 .  Multiple Vitamins-Minerals (EYE VITAMINS PO), Take by mouth daily. , Disp: , Rfl:  .  Riboflavin (VITAMIN B-2 PO), Take 1 tablet by mouth daily. , Disp: , Rfl:  .  sodium chloride (OCEAN) 0.65 % nasal spray, Place 1 spray into the nose 2 (two) times daily as needed for congestion., Disp: , Rfl:  .  triamcinolone cream (KENALOG) 0.1 %, Apply 1 application topically 2 (two) times daily as needed (itching)., Disp: 30 g, Rfl: 0 .  vitamin C (ASCORBIC ACID) 500 MG tablet, Take 500 mg by mouth daily., Disp: , Rfl:   EXAM:  Vitals:   09/03/16 1017  BP: 136/84  Pulse: 89  Temp: 97.4 F (36.3 C)    GENERAL: vitals reviewed and listed below, alert, oriented, appears well hydrated and in no acute distress  HEENT: head atraumatic, PERRLA, normal  appearance of eyes, ears, nose and mouth. moist mucus membranes.  NECK: supple, no masses or lymphadenopathy  LUNGS: clear to auscultation bilaterally, no rales, rhonchi or wheeze  CV: HRRR, no peripheral edema or cyanosis, normal pedal pulses  ABDOMEN: bowel sounds normal, soft, non tender to palpation, no masses, no rebound or guarding  BREAST: normal appearance - no skin lesions or discharge noted on inspection of both breasts, on palpation of both breast and axillary region no suspicious lesions appreciated today  GU: declined  RECTAL: deferred  SKIN: no rash or abnormal lesions  MS: normal gait, moves all extremities normally  NEURO: normal gait, speech and thought processing grossly intact, muscle tone grossly intact throughout  PSYCH: normal affect, pleasant and cooperative  ASSESSMENT AND PLAN:  Discussed the following assessment and plan:  Physical exam  Essential hypertension, benign - Plan: Basic metabolic panel, CBC  Hypothyroidism, unspecified type - Plan: TSH  cholesterol screening - Plan: Lipid panel   -Discussed and advised all [place] preventive services health task force level A and B recommendations for age, sex and risks.  -Advised at least 150 minutes of exercise per week and a healthy diet with avoidance of (less then 1 serving per week) processed foods, white starches, red meat, fast foods and sweets and consisting of: * 5-9 servings of fresh fruits and vegetables (not corn or potatoes) *nuts and seeds, beans *olives and olive oil *lean meats such as fish and white chicken  *whole grains  -labs, studies and vaccines per orders this encounter  Orders Placed This Encounter  Procedures  . Lipid panel  . Basic metabolic panel  . CBC  . TSH    Patient advised to return to clinic immediately if symptoms worsen or persist or new concerns.  Patient Instructions  BEFORE YOU LEAVE: -follow up: 3- 4 months -labs  We have ordered labs or studies at  this visit. It can take up to 1-2 weeks for results and processing. IF results require follow up or explanation, we will call you with instructions. Clinically stable results will be released to your [facility]. If you have not heard from [place] or cannot find your results in [facility] in 2 weeks please contact our office at [phone].  If you are not yet signed up for [facility], please consider signing up.   Preventive Care 37 Years and Older, Female Preventive care refers to lifestyle choices and visits with your health care provider that can promote health and wellness. What does preventive care include?  A yearly check up with your doctor.  Dental exams once or twice a year.  Routine eye exams. Ask your health care  provider how often you should have your eyes checked.  Personal lifestyle choices, including:  Daily care of your teeth and gums.  Regular physical activity.  Eating a healthy diet.  Avoiding tobacco and drug use.  Limiting alcohol use.  Practicing safe sex.  Taking low-dose aspirin every day.  Taking vitamin and mineral supplements as recommended by your health care provider. What happens during an annual well check? The services and screenings done by your health care provider during your annual well check will depend on your age, overall health, lifestyle risk factors, and family history of disease. Counseling  Your health care provider may ask you questions about your:  Alcohol use.  Tobacco use.  Drug use.  Emotional well-being.  Home and relationship well-being.  Sexual activity.  Eating habits.  History of falls.  Memory and ability to understand (cognition).  Work and work Statistician.  Reproductive health. Screening  You may have the following tests or measurements depending on your risk factors:  Height, weight, and BMI.  Blood pressure.  Lipid and cholesterol levels. These may be checked every 5 years, or more frequently if you are  over 63 years old.  Skin check.  Lung cancer screening. You may have this screening every year starting at age 64 if you have a 30-pack-year history of smoking and currently smoke or have quit within the past 15 years.  Fecal occult blood test (FOBT) of the stool. You may have this test every year starting at age 31.  Flexible sigmoidoscopy or colonoscopy. You may have a sigmoidoscopy every 5 years or a colonoscopy every 10 years starting at age 41.  Hepatitis C blood test.  Hepatitis B blood test.  Sexually transmitted disease (STD) testing.  Diabetes screening. This is done by checking your blood sugar (glucose) after you have not eaten for a while (fasting). You may have this done every 1-3 years.  Bone density scan. This is done to screen for osteoporosis. You may have this done starting at age 49.  Mammogram. This may be done every 1-2 years. Talk to your health care provider about how often you should have regular mammograms. Talk with your health care provider about your test results, treatment options, and if necessary, the need for more tests. Vaccines  Your health care provider may recommend certain vaccines, such as:  Influenza vaccine. This is recommended every year.  Tetanus, diphtheria, and acellular pertussis (Tdap, Td) vaccine. You may need a Td booster every 10 years.  Varicella vaccine. You may need this if you have not been vaccinated.  Zoster vaccine. You may need this after age 6.  Measles, mumps, and rubella (MMR) vaccine. You may need at least one dose of MMR if you were born in 1957 or later. You may also need a second dose.  Pneumococcal 13-valent conjugate (PCV13) vaccine. One dose is recommended after age 29.  Pneumococcal polysaccharide (PPSV23) vaccine. One dose is recommended after age 76.  Meningococcal vaccine. You may need this if you have certain conditions.  Hepatitis A vaccine. You may need this if you have certain conditions or if you  travel or work in places where you may be exposed to hepatitis A.  Hepatitis B vaccine. You may need this if you have certain conditions or if you travel or work in places where you may be exposed to hepatitis B.  Haemophilus influenzae type b (Hib) vaccine. You may need this if you have certain conditions. Talk to your health care provider about  which screenings and vaccines you need and how often you need them. This information is not intended to replace advice given to you by your health care provider. Make sure you discuss any questions you have with your health care provider. Document Released: 05/27/2015 Document Revised: 01/18/2016 Document Reviewed: 03/01/2015 Elsevier Interactive Patient Education  2017 [employer].    No Follow-up on file.  Colin Benton R., DO

## 2016-09-03 NOTE — Progress Notes (Signed)
Pre visit review using our clinic review tool, if applicable. No additional management support is needed unless otherwise documented below in the visit note. 

## 2016-09-04 ENCOUNTER — Encounter: Payer: Self-pay | Admitting: *Deleted

## 2016-10-09 ENCOUNTER — Encounter (HOSPITAL_COMMUNITY): Payer: Self-pay | Admitting: *Deleted

## 2016-10-09 ENCOUNTER — Observation Stay (HOSPITAL_COMMUNITY)
Admission: EM | Admit: 2016-10-09 | Discharge: 2016-10-11 | Disposition: A | Discharge status: Skilled Nursing Facility | Payer: Medicare Other | Attending: Internal Medicine | Admitting: Internal Medicine

## 2016-10-09 ENCOUNTER — Emergency Department (HOSPITAL_COMMUNITY): Payer: Medicare Other

## 2016-10-09 DIAGNOSIS — M549 Dorsalgia, unspecified: Secondary | ICD-10-CM | POA: Diagnosis present

## 2016-10-09 DIAGNOSIS — S32019A Unspecified fracture of first lumbar vertebra, initial encounter for closed fracture: Secondary | ICD-10-CM | POA: Diagnosis not present

## 2016-10-09 DIAGNOSIS — R739 Hyperglycemia, unspecified: Secondary | ICD-10-CM | POA: Diagnosis not present

## 2016-10-09 DIAGNOSIS — Z882 Allergy status to sulfonamides status: Secondary | ICD-10-CM | POA: Diagnosis not present

## 2016-10-09 DIAGNOSIS — W182XXA Fall in (into) shower or empty bathtub, initial encounter: Secondary | ICD-10-CM | POA: Insufficient documentation

## 2016-10-09 DIAGNOSIS — S32029A Unspecified fracture of second lumbar vertebra, initial encounter for closed fracture: Secondary | ICD-10-CM | POA: Insufficient documentation

## 2016-10-09 DIAGNOSIS — S32039A Unspecified fracture of third lumbar vertebra, initial encounter for closed fracture: Secondary | ICD-10-CM | POA: Diagnosis not present

## 2016-10-09 DIAGNOSIS — Z9049 Acquired absence of other specified parts of digestive tract: Secondary | ICD-10-CM | POA: Insufficient documentation

## 2016-10-09 DIAGNOSIS — Z923 Personal history of irradiation: Secondary | ICD-10-CM | POA: Insufficient documentation

## 2016-10-09 DIAGNOSIS — Z881 Allergy status to other antibiotic agents status: Secondary | ICD-10-CM | POA: Diagnosis not present

## 2016-10-09 DIAGNOSIS — Z90710 Acquired absence of both cervix and uterus: Secondary | ICD-10-CM | POA: Diagnosis not present

## 2016-10-09 DIAGNOSIS — H3530 Unspecified macular degeneration: Secondary | ICD-10-CM | POA: Insufficient documentation

## 2016-10-09 DIAGNOSIS — Z880 Allergy status to penicillin: Secondary | ICD-10-CM | POA: Insufficient documentation

## 2016-10-09 DIAGNOSIS — S2231XA Fracture of one rib, right side, initial encounter for closed fracture: Secondary | ICD-10-CM | POA: Diagnosis not present

## 2016-10-09 DIAGNOSIS — Z8261 Family history of arthritis: Secondary | ICD-10-CM | POA: Insufficient documentation

## 2016-10-09 DIAGNOSIS — R7611 Nonspecific reaction to tuberculin skin test without active tuberculosis: Secondary | ICD-10-CM | POA: Insufficient documentation

## 2016-10-09 DIAGNOSIS — Z8542 Personal history of malignant neoplasm of other parts of uterus: Secondary | ICD-10-CM | POA: Diagnosis not present

## 2016-10-09 DIAGNOSIS — I10 Essential (primary) hypertension: Secondary | ICD-10-CM | POA: Insufficient documentation

## 2016-10-09 DIAGNOSIS — Z66 Do not resuscitate: Secondary | ICD-10-CM | POA: Diagnosis not present

## 2016-10-09 DIAGNOSIS — S299XXA Unspecified injury of thorax, initial encounter: Secondary | ICD-10-CM | POA: Diagnosis not present

## 2016-10-09 DIAGNOSIS — S79912A Unspecified injury of left hip, initial encounter: Secondary | ICD-10-CM | POA: Diagnosis not present

## 2016-10-09 DIAGNOSIS — M4316 Spondylolisthesis, lumbar region: Secondary | ICD-10-CM | POA: Diagnosis not present

## 2016-10-09 DIAGNOSIS — M5489 Other dorsalgia: Secondary | ICD-10-CM | POA: Diagnosis not present

## 2016-10-09 DIAGNOSIS — Z85038 Personal history of other malignant neoplasm of large intestine: Secondary | ICD-10-CM | POA: Insufficient documentation

## 2016-10-09 DIAGNOSIS — M545 Low back pain, unspecified: Secondary | ICD-10-CM

## 2016-10-09 DIAGNOSIS — M5136 Other intervertebral disc degeneration, lumbar region: Secondary | ICD-10-CM | POA: Insufficient documentation

## 2016-10-09 DIAGNOSIS — M48061 Spinal stenosis, lumbar region without neurogenic claudication: Secondary | ICD-10-CM | POA: Diagnosis not present

## 2016-10-09 DIAGNOSIS — Z833 Family history of diabetes mellitus: Secondary | ICD-10-CM | POA: Insufficient documentation

## 2016-10-09 DIAGNOSIS — Z8371 Family history of colonic polyps: Secondary | ICD-10-CM | POA: Insufficient documentation

## 2016-10-09 DIAGNOSIS — E039 Hypothyroidism, unspecified: Secondary | ICD-10-CM | POA: Diagnosis not present

## 2016-10-09 DIAGNOSIS — W19XXXA Unspecified fall, initial encounter: Secondary | ICD-10-CM

## 2016-10-09 DIAGNOSIS — Z79899 Other long term (current) drug therapy: Secondary | ICD-10-CM | POA: Insufficient documentation

## 2016-10-09 DIAGNOSIS — Z8349 Family history of other endocrine, nutritional and metabolic diseases: Secondary | ICD-10-CM | POA: Insufficient documentation

## 2016-10-09 DIAGNOSIS — S32009A Unspecified fracture of unspecified lumbar vertebra, initial encounter for closed fracture: Secondary | ICD-10-CM

## 2016-10-09 DIAGNOSIS — M25552 Pain in left hip: Secondary | ICD-10-CM | POA: Diagnosis not present

## 2016-10-09 DIAGNOSIS — S79911A Unspecified injury of right hip, initial encounter: Secondary | ICD-10-CM | POA: Diagnosis not present

## 2016-10-09 DIAGNOSIS — M25551 Pain in right hip: Secondary | ICD-10-CM | POA: Diagnosis not present

## 2016-10-09 DIAGNOSIS — M546 Pain in thoracic spine: Secondary | ICD-10-CM | POA: Diagnosis not present

## 2016-10-09 DIAGNOSIS — T148XXA Other injury of unspecified body region, initial encounter: Secondary | ICD-10-CM | POA: Diagnosis not present

## 2016-10-09 DIAGNOSIS — Z8673 Personal history of transient ischemic attack (TIA), and cerebral infarction without residual deficits: Secondary | ICD-10-CM | POA: Diagnosis not present

## 2016-10-09 DIAGNOSIS — Z9889 Other specified postprocedural states: Secondary | ICD-10-CM | POA: Insufficient documentation

## 2016-10-09 DIAGNOSIS — E876 Hypokalemia: Secondary | ICD-10-CM | POA: Diagnosis not present

## 2016-10-09 DIAGNOSIS — Z9221 Personal history of antineoplastic chemotherapy: Secondary | ICD-10-CM | POA: Insufficient documentation

## 2016-10-09 DIAGNOSIS — I700 Atherosclerosis of aorta: Secondary | ICD-10-CM | POA: Insufficient documentation

## 2016-10-09 HISTORY — DX: Unspecified macular degeneration: H35.30

## 2016-10-09 HISTORY — DX: Unspecified hearing loss, unspecified ear: H91.90

## 2016-10-09 LAB — BASIC METABOLIC PANEL
Anion gap: 10 (ref 5–15)
BUN: 14 mg/dL (ref 6–20)
CO2: 24 mmol/L (ref 22–32)
Calcium: 8.9 mg/dL (ref 8.9–10.3)
Chloride: 104 mmol/L (ref 101–111)
Creatinine, Ser: 0.61 mg/dL (ref 0.44–1.00)
GFR calc Af Amer: >60 mL/min (ref >60)
Glucose, Bld: 126 mg/dL — ABNORMAL HIGH (ref 65–99)
Potassium: 3.7 mmol/L (ref 3.5–5.1)
SODIUM: 138 mmol/L (ref 135–145)

## 2016-10-09 LAB — CBC WITH DIFFERENTIAL/PLATELET
BASOS ABS: 0 10*3/uL (ref 0.0–0.1)
Basophils Relative: 0 %
EOS ABS: 0.1 10*3/uL (ref 0.0–0.7)
EOS PCT: 1 %
HCT: 38.5 % (ref 36.0–46.0)
Hemoglobin: 12.5 g/dL (ref 12.0–15.0)
LYMPHS PCT: 12 %
Lymphs Abs: 0.9 10*3/uL (ref 0.7–4.0)
MCH: 31.2 pg (ref 26.0–34.0)
MCHC: 32.5 g/dL (ref 30.0–36.0)
MCV: 96 fL (ref 78.0–100.0)
Monocytes Absolute: 0.6 10*3/uL (ref 0.1–1.0)
Monocytes Relative: 8 %
Neutro Abs: 5.9 10*3/uL (ref 1.7–7.7)
Neutrophils Relative %: 79 %
PLATELETS: 218 10*3/uL (ref 150–400)
RBC: 4.01 MIL/uL (ref 3.87–5.11)
RDW: 13.2 % (ref 11.5–15.5)
WBC: 7.6 10*3/uL (ref 4.0–10.5)

## 2016-10-09 MED ORDER — ENOXAPARIN SODIUM 30 MG/0.3ML ~~LOC~~ SOLN
30.0000 mg | SUBCUTANEOUS | Status: DC
  Administered 2016-10-09: 30 mg via SUBCUTANEOUS
  Filled 2016-10-09: qty 0.3

## 2016-10-09 MED ORDER — LEVOTHYROXINE SODIUM 50 MCG PO TABS
50.0000 ug | ORAL_TABLET | ORAL | Status: DC
  Administered 2016-10-10 – 2016-10-11 (×2): 50 ug via ORAL
  Filled 2016-10-09 (×2): qty 1

## 2016-10-09 MED ORDER — DIPHENHYDRAMINE HCL 50 MG/ML IJ SOLN
12.5000 mg | Freq: Once | INTRAMUSCULAR | Status: AC
  Administered 2016-10-09: 12.5 mg via INTRAVENOUS
  Filled 2016-10-09: qty 1

## 2016-10-09 MED ORDER — ONDANSETRON HCL 4 MG/2ML IJ SOLN
4.0000 mg | INTRAMUSCULAR | Status: AC
  Administered 2016-10-09: 4 mg via INTRAVENOUS
  Filled 2016-10-09: qty 2

## 2016-10-09 MED ORDER — LIDOCAINE 5 % EX PTCH
2.0000 | MEDICATED_PATCH | CUTANEOUS | Status: DC
  Administered 2016-10-09 – 2016-10-10 (×2): 2 via TRANSDERMAL
  Filled 2016-10-09 (×2): qty 2

## 2016-10-09 MED ORDER — ACETAMINOPHEN 650 MG RE SUPP: 650.0000 mg | Freq: Four times a day (QID) | RECTAL | Status: DC | PRN

## 2016-10-09 MED ORDER — LEVOTHYROXINE SODIUM 50 MCG PO TABS: 50.0000 ug | ORAL_TABLET | Freq: Every day | ORAL | Status: DC

## 2016-10-09 MED ORDER — LACTATED RINGERS IV SOLN
INTRAVENOUS | Status: DC
  Administered 2016-10-09: 1000 mL via INTRAVENOUS

## 2016-10-09 MED ORDER — METHOCARBAMOL 1000 MG/10ML IJ SOLN
500.0000 mg | Freq: Four times a day (QID) | INTRAVENOUS | Status: DC | PRN
  Administered 2016-10-10: 500 mg via INTRAVENOUS
  Filled 2016-10-09 (×3): qty 5

## 2016-10-09 MED ORDER — LISINOPRIL 20 MG PO TABS
20.0000 mg | ORAL_TABLET | Freq: Every day | ORAL | Status: DC
  Administered 2016-10-09 – 2016-10-11 (×3): 20 mg via ORAL
  Filled 2016-10-09 (×3): qty 1

## 2016-10-09 MED ORDER — DOCUSATE SODIUM 100 MG PO CAPS
100.0000 mg | ORAL_CAPSULE | Freq: Two times a day (BID) | ORAL | Status: DC
  Administered 2016-10-09 – 2016-10-11 (×4): 100 mg via ORAL
  Filled 2016-10-09 (×4): qty 1

## 2016-10-09 MED ORDER — ACETAMINOPHEN 325 MG PO TABS
650.0000 mg | ORAL_TABLET | Freq: Four times a day (QID) | ORAL | Status: DC | PRN
  Administered 2016-10-10 – 2016-10-11 (×4): 650 mg via ORAL
  Filled 2016-10-09 (×4): qty 2

## 2016-10-09 MED ORDER — ONDANSETRON HCL 4 MG/2ML IJ SOLN
4.0000 mg | Freq: Once | INTRAMUSCULAR | Status: AC
  Administered 2016-10-09: 4 mg via INTRAVENOUS
  Filled 2016-10-09: qty 2

## 2016-10-09 MED ORDER — FENTANYL CITRATE (PF) 100 MCG/2ML IJ SOLN
50.0000 ug | Freq: Once | INTRAMUSCULAR | Status: AC
  Administered 2016-10-09: 50 ug via INTRAVENOUS
  Filled 2016-10-09: qty 2

## 2016-10-09 MED ORDER — PROMETHAZINE HCL 25 MG PO TABS: 12.5000 mg | ORAL_TABLET | Freq: Four times a day (QID) | ORAL | Status: DC | PRN

## 2016-10-09 MED ORDER — LEVOTHYROXINE SODIUM 100 MCG PO TABS: 100.0000 ug | ORAL_TABLET | ORAL | Status: DC

## 2016-10-09 NOTE — ED Notes (Signed)
Patient transported to CT 

## 2016-10-09 NOTE — Care Management Obs Status (Signed)
[place] NOTIFICATION   Patient Details  Name: Lori [name] MRN: [id number] Date of Birth: 06-22-1918   Medicare Observation Status Notification Given:  Ernesta Amble, RN 10/09/2016, 8:50 PM

## 2016-10-09 NOTE — Care Management Obs Status (Signed)
[place] NOTIFICATION   Patient Details  Name: Lori [name] MRN: [id number] Date of Birth: 1919-04-28   Medicare Observation Status Notification Given:  Ernesta Amble, RN 10/09/2016, 9:28 PM

## 2016-10-09 NOTE — H&P (Signed)
History and Physical    Lori [name] JIR:[id number] DOB: April 26, 1919 DOA: 10/09/2016  PCP: Lucretia Kern, DO Consultants:  Alleen Borne - ophthalmology Patient coming from: home - lives with daughter; [name]: daughter, [phone]  Chief Complaint: fall  HPI: Lori [name] is a 83 y.o. female with medical history significant of remote TIA; remote endometrial and colon CA; and HTN and macular degeneration presenting after a mechanical fall in the bathroom today.  She fell with her back to the tub and she slammed back against the tub.  Severe pain since the fall.  She was given Fentanyl in the ER with some relief.  Also had an allergic reaction to the Zofran given in the ER (mild erythema of her arm).  She has felt quite nauseated without vomiting.  She is hoping to have PT assess her.   ED Course: mechanical fall with rib and transverse process fractures.  Observation for pain control and PT.  Review of Systems: As per HPI; otherwise review of systems reviewed and negative.   Ambulatory Status:  Ambulates with a cane for balance  Past Medical History:  Diagnosis Date  . Colon cancer ([place]) 2009   s/p colon resection  . Endometrial cancer ([place]) 1989   endometrial s/p hysterectomy, chemo and radiation  . Hearing loss   . History of blood transfusion   . Hypertension   . Macular degeneration   . Migraines   . Pneumonia   . Positive TB test   . Stroke [facility]) 2004   ? TIA, brief episode of speech issues - evaluate OH    Past Surgical History:  Procedure Laterality Date  . ABDOMINAL HYSTERECTOMY  1989  . APPENDECTOMY    . COLON RESECTION  08/27/2007  . GALLBLADDER SURGERY  1970  . SUBDURAL HEMATOMA EVACUATION VIA CRANIOTOMY  2002  . TONSILLECTOMY AND ADENOIDECTOMY  1943    Social History   Social History  . Marital status: Widowed    Spouse name: N/A  . Number of children: 2  . Years of education: N/A   Occupational History  . retired    Social History Main Topics    . Smoking status: Never Smoker  . Smokeless tobacco: Never Used  . Alcohol use Yes     Comment: occ glass of wine   . Drug use: No  . Sexual activity: Not on file   Other Topics Concern  . 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 Bactrim [Sulfamethoxazole-Trimethoprim] Itching    Blisters   . Clindamycin/Lincomycin Other (See Comments)    GI upset  . Macrobid [Nitrofurantoin Macrocrystal] Itching  . Penicillins Itching  . Sulfa Antibiotics Itching    Family History  Problem Relation Age of Onset  . Arthritis Mother   . Heart disease Father   . Hyperlipidemia Father   . Hypertension Father   . Diabetes Sister   . Crohn's disease Daughter     Prior to Admission medications   Medication Sig Start Date End Date Taking? Authorizing Provider  Cholecalciferol (VITAMIN D PO) Take 1 capsule by mouth daily.    Yes [provider]  levothyroxine (SYNTHROID, LEVOTHROID) 50 MCG tablet TAKE 2 TABLETS  ON MONDAY AND FRIDAY. TAKE 1 TABLET ALL OTHER DAYS 06/04/16  Yes Colin Benton R, DO  lisinopril (PRINIVIL,ZESTRIL) 20 MG tablet TAKE 1 TABLET (20 MG TOTAL) BY MOUTH DAILY. 03/23/16  Yes Lucretia Kern, DO  Multiple Vitamins-Minerals (EYE VITAMINS PO) Take 1 tablet by mouth 2 (two) times daily.    Yes [provider]  Riboflavin (VITAMIN B-2 PO) Take 1 tablet by mouth daily.    Yes [provider]  vitamin C (ASCORBIC ACID) 500 MG tablet Take 500 mg by mouth daily.   Yes [provider]  triamcinolone cream (KENALOG) 0.1 % Apply 1 application topically 2 (two) times daily as needed (itching). Patient not taking: Reported on 10/09/2016 10/26/13   Lucretia Kern, DO    Physical Exam: Vitals:   10/09/16 1815 10/09/16 1830 10/09/16 1842 10/09/16 2036  BP:  (!) 162/105 (!) 152/72 (!) 152/72 (!) 166/84  Pulse: 69 78 79 83  Resp:   (!) 21 20  Temp:    98.7 F (37.1 C)  TempSrc:    Oral  SpO2: 98% 98% 99% 97%  Weight:    49 kg (108 lb 0.4 oz)  Height:    5' (1.524 m)     General:  Appears calm and comfortable and is NAD; when she moves, she cries out in pain and winces Eyes:  PERRL, EOMI, normal lids, iris ENT:  Hard of  hearing,normal lips & tongue, mmm Neck: no LAD, masses or thyromegaly Cardiovascular:  RRR, no m/r/g. No LE edema.  Respiratory:  CTA bilaterally, no w/r/r. Normal respiratory effort. Abdomen:  soft, ntnd, NABS Skin:  no rash or induration seen on limited exam Musculoskeletal:  grossly normal tone BUE/BLE, good ROM, no bony abnormality Psychiatric:  grossly normal mood and affect, speech fluent and appropriate, AOx3 Neurologic:  CN 2-12 grossly intact, moves all extremities in coordinated fashion but reluctantly due to pain, sensation intact  Labs on Admission: I have personally reviewed following labs and imaging studies  CBC:  Recent Labs Lab 10/09/16 1340  WBC 7.6  NEUTROABS 5.9  HGB 12.5  HCT 38.5  MCV 96.0  PLT 562   Basic Metabolic Panel:  Recent Labs Lab 10/09/16 1340  NA 138  K 3.7  CL 104  CO2 24  GLUCOSE 126*  BUN 14  CREATININE 0.61  CALCIUM 8.9   GFR: Estimated Creatinine Clearance: 29.5 mL/min (by C-G formula based on SCr of 0.61 mg/dL). Liver Function Tests: No results for input(s): AST, ALT, ALKPHOS, BILITOT, PROT, ALBUMIN in the last 168 hours. No results for input(s): LIPASE, AMYLASE in the last 168 hours. No results for input(s): AMMONIA in the last 168 hours. Coagulation Profile: No results for input(s): INR, PROTIME in the last 168 hours. Cardiac Enzymes: No results for input(s): CKTOTAL, CKMB, CKMBINDEX, TROPONINI in the last 168 hours. BNP (last 3 results) No results for input(s): PROBNP in the last 8760 hours. HbA1C: No results for input(s): HGBA1C in the last 72  hours. CBG: No results for input(s): GLUCAP in the last 168 hours. Lipid Profile: No results for input(s): CHOL, HDL, LDLCALC, TRIG, CHOLHDL, LDLDIRECT in the last 72 hours. Thyroid Function Tests: No results for input(s): TSH, T4TOTAL, FREET4, T3FREE, THYROIDAB in the last 72 hours. Anemia Panel: No results for input(s): VITAMINB12, FOLATE, FERRITIN, TIBC, IRON, RETICCTPCT in the last 72 hours. Urine analysis:    Component Value Date/Time   BILIRUBINUR n 03/16/2013 1504   PROTEINUR n 03/16/2013 1504   UROBILINOGEN 0.2  03/16/2013 1504   NITRITE n 03/16/2013 1504   LEUKOCYTESUR large (3+) 03/16/2013 1504    Creatinine Clearance: Estimated Creatinine Clearance: 29.5 mL/min (by C-G formula based on SCr of 0.61 mg/dL).  Sepsis Labs: @LABRCNTIP (procalcitonin:4,lacticidven:4) )No results found for this or any previous visit (from the past 240 hour(s)).   Radiological Exams on Admission: Dg Chest 2 View  Result Date: 10/09/2016 CLINICAL DATA:  Fall in bathtub. EXAM: CHEST  2 VIEW COMPARISON:  Radiographs of April 30, 2013. FINDINGS: Stable cardiomediastinal silhouette. No pneumothorax or pleural effusion is noted. Probable eventration of right hemidiaphragm is noted. No acute pulmonary disease is noted. No acute abnormality seen in the bony thorax. IMPRESSION: No active cardiopulmonary disease. Electronically Signed   By: Marijo Conception, M.D.   On: 10/09/2016 14:54   Dg Thoracic Spine W/swimmers  Result Date: 10/09/2016 CLINICAL DATA:  Upper back pain after fall in tub. EXAM: THORACIC SPINE - 3 VIEWS COMPARISON:  Radiographs of April 30, 2013. FINDINGS: Moderate levoscoliosis of lower thoracic spine is noted. Mild wedge compression deformity of T10 vertebral body is noted which appears to be old and consistent with old compression fracture. No acute fracture or spondylolisthesis is noted. Mild anterior osteophyte formation is noted in mid thoracic spine. IMPRESSION: Probable T10  compression fracture. No acute abnormality seen in the thoracic spine. Electronically Signed   By: Marijo Conception, M.D.   On: 10/09/2016 14:52   Dg Lumbar Spine Complete  Result Date: 10/09/2016 CLINICAL DATA:  Acute lower back pain after fall in tub. EXAM: LUMBAR SPINE - COMPLETE 4+ VIEW COMPARISON:  None. FINDINGS: Moderate dextroscoliosis of lumbar spine is noted. Mild degenerative disc disease is noted at L2-3 L3-4 and L4-5 with anterior osteophyte formation. No fracture or spondylolisthesis is noted. IMPRESSION: Multilevel degenerative disc disease. No acute abnormality seen in the lumbar spine. Electronically Signed   By: Marijo Conception, M.D.   On: 10/09/2016 14:49   Ct Lumbar Spine Wo Contrast  Result Date: 10/09/2016 CLINICAL DATA:  83 y/o F; is fall with injury to lower back against that level. EXAM: CT LUMBAR SPINE WITHOUT CONTRAST TECHNIQUE: Multidetector CT imaging of the lumbar spine was performed without intravenous contrast administration. Multiplanar CT image reconstructions were also generated. COMPARISON:  07/13/2013 CT abdomen and pelvis. FINDINGS: Segmentation: 5 lumbar type vertebrae. Alignment: Moderate lumbar dextrocurvature with apex at L3. Grade 1 L4-5 anterolisthesis. Vertebrae: Right L1-L3 transverse process mildly displaced fractures. Mildly displaced fracture of the right posterior twelfth rib. Paraspinal and other soft tissues: Mild dependent atelectasis in the lung bases. Bilateral adrenal gland thickening. Calcific atherosclerosis of the abdominal aorta. Cholecystectomy. Soft tissue stranding in the right flank soft tissues compatible with contusion, no large hematoma identified. Disc levels: Advanced lumbar spondylosis with disc and facet degenerative changes. Canal stenosis is greatest at the L4-5 level where it is at least moderate. IMPRESSION: 1. Acute mildly displaced fractures of the right 12 rib as well as the right L1-L3 transverse processes. 2. No additional acute  fracture identified. 3. Right flank mild soft tissue contusion. No large hematoma identified. 4. Stable moderate lumbar dextrocurvature and L4-5 grade 1 anterolisthesis. 5. Stable lumbar spondylosis with at least moderate canal stenosis the L4-5 level. Electronically Signed   By: Kristine Garbe M.D.   On: 10/09/2016 16:31   Dg Hips Bilat W Or Wo Pelvis 3-4 Views  Result Date: 10/09/2016 CLINICAL DATA:  Bilateral hip pain after fall in tail. EXAM: DG HIP (WITH OR WITHOUT PELVIS) 3-4V  BILAT COMPARISON:  None. FINDINGS: There is no evidence of hip fracture or dislocation. There is no evidence of arthropathy or other focal bone abnormality. IMPRESSION: Normal bilateral hips. Electronically Signed   By: Marijo Conception, M.D.   On: 10/09/2016 14:56    EKG: not done  Assessment/Plan Principal Problem:   Back pain Active Problems:   Essential hypertension, benign   Hypothyroidism   Hyperglycemia   Back pain -Mechanical fall resulting in rib and L1-3 transverse process fractures -While this is obviously painful, there is no surgical intervention to offer at this time -SCDs -Pain control with Tylenol, Robaxin, Lidoderm; if additional pain control is needed, would try Tramadol next -PT consult in AM -With apparent allergy to Zofran in the ER, will order Phenergan prn n/v  HTN -Continue Lisinopril   Hypothyroidism -Recent normal TSH -Continue Synthroid at current dose  Hyperglycemia -Glucose 126 -May be stress response -Will follow with fasting AM labs -It is unlikely that he will need acute or chronic treatment for this issue  DVT prophylaxis: SCDs Code Status:  DNR - confirmed with patient/family Family Communication: Daughter present throughout evaluation  Disposition Plan: To be determined Consults called: PT Admission status: It is my clinical opinion that referral for OBSERVATION is reasonable and necessary in this patient based on the above information provided. The  aforementioned taken together are felt to place the patient at high risk for further clinical deterioration. However it is anticipated that the patient may be medically stable for discharge from the hospital within 24 to 48 hours.     Karmen Bongo MD [facility]  If 7PM-7AM, please contact night-coverage [email or web address] Password [email or web address]  10/10/2016, 12:21 AM

## 2016-10-09 NOTE — ED Notes (Signed)
Report attempted x 1

## 2016-10-09 NOTE — ED Notes (Signed)
Pt had reaction to zofran. Redness and itchiness noticed to left upper arm. Made MD wentz aware.

## 2016-10-09 NOTE — Progress Notes (Signed)
Patient arrived around 2030 alert and oriented pain 4-5 on 0-10 scale received Fentanyl in ED became nauseous was given Zofran had a reaction to that, she is currently not complaining of nausea awaiting daughters return.

## 2016-10-09 NOTE — ED Provider Notes (Signed)
  Face-to-face evaluation   History: Patient had a fall in her bathroom when she stepped backwards losing her balance, injuring her lower back.  Physical exam: Alert elderly female. Tender lumbar, she is able to sit in the stretcher without significant pain.  At this time the patient's left arm, is reddened, medial forearm region, following treatment with Zofran.  This is consistent with a local allergic reaction from Zofran.  No oral angioedema, no wheezing.  17: 40-patient's daughter and the patient states she is unable to go home, because of the significant pain in her lower back.  They are willing to stay for observation, and arrangement of a treatment plan.  5:46 PM-Consult complete with hospitalist. Patient case explained and discussed.  She agrees to admit patient for further evaluation and treatment. Call ended at 4: 30  Diagnoses that have been ruled out:  None  Diagnoses that are still under consideration:  None  Final diagnoses:  Closed fracture of one rib of right side, initial encounter  Lumbar transverse process fracture, closed, initial encounter ([place])  Fall, initial encounter  Acute right-sided low back pain without sciatica     Medical screening examination/treatment/procedure(s) were conducted as a shared visit with non-physician practitioner(s) and myself.  I personally evaluated the patient during the encounter   Daleen Bo, MD 10/10/16 [phone]

## 2016-10-09 NOTE — ED Notes (Signed)
Informed Dr. Eulis Foster of pt's nausea.

## 2016-10-09 NOTE — ED Notes (Signed)
Patient presents to ed via [facility] states she was standing in her bathroom and slipped on a small rug, hitting the middle of her back on the bathtub. C/o pain lower back , more right lower back. Patient was given Fentanyl 50 mcg per ems, states this helped a lot with the pain however she was still have some pain. After she fell she was able to get up and call her daughter , however she said it was very painful.

## 2016-10-09 NOTE — ED Provider Notes (Signed)
[facility] DEPT Provider Note   CSN: [id number] Arrival date & time:        History   Chief Complaint Chief Complaint  Patient presents with  . Fall    HPI Lori [name] is a 83 y.o. female who presents to the emergency department after mechanical fall with chief complaint of back pain. She is gathered by the patient and EMS. She apparently stumbled backward and had a mechanical fall in her bathroom. She hit her lumbar spine exams the rim of her bathtub. She had immediate and severe pain but was able to get to a phone. She states that she practically had to drag herself because it hurts so much and was unable to ambulate. She complains of severe lumbar pain but denies any numbness, tingling. She did not hit her head or lose consciousness.  HPI  Past Medical History:  Diagnosis Date  . Colon cancer ([place]) 2009   s/p colon resection  . Endometrial cancer ([place]) 1989   endometrial s/p hysterectomy, chemo and radiation  . History of blood transfusion   . Hypertension   . Migraines   . Pneumonia   . Positive TB test   . Stroke [facility]) 2002   ? TIA, brief episode of speech issues - evaluate OH    Patient Active Problem List   Diagnosis Date Noted  . Change in bowel habits 07/24/2013  . History of colon cancer 07/24/2013  . History of endometrial cancer 07/24/2013  . Loss of weight 07/24/2013  . Cystitis 03/16/2013  . Essential hypertension, benign 11/24/2012  . Hx of TIA (transient ischemic attack)  11/24/2012  . Hypothyroidism 11/24/2012    Past Surgical History:  Procedure Laterality Date  . ABDOMINAL HYSTERECTOMY  1989  . APPENDECTOMY    . COLON RESECTION  08/27/2007  . GALLBLADDER SURGERY  1970  . SUBDURAL HEMATOMA EVACUATION VIA CRANIOTOMY    . TONSILLECTOMY AND ADENOIDECTOMY  1943    OB History    No data available       Home Medications    Prior to Admission medications   Medication Sig Start Date End Date Taking? Authorizing Provider    Cholecalciferol (VITAMIN D PO) Take 1 capsule by mouth daily.    Yes [provider]  levothyroxine (SYNTHROID, LEVOTHROID) 50 MCG tablet TAKE 2 TABLETS ON MONDAY AND FRIDAY. TAKE 1 TABLET ALL OTHER DAYS 06/04/16  Yes Colin Benton R, DO  lisinopril (PRINIVIL,ZESTRIL) 20 MG tablet TAKE 1 TABLET (20 MG TOTAL) BY MOUTH DAILY. 03/23/16  Yes Lucretia Kern, DO  Multiple Vitamins-Minerals (EYE VITAMINS PO) Take 1 tablet by mouth 2 (two) times daily.    Yes [provider]  Riboflavin (VITAMIN B-2 PO) Take 1 tablet by mouth daily.    Yes [provider]  vitamin C (ASCORBIC ACID) 500 MG tablet Take 500 mg by mouth daily.   Yes [provider]  triamcinolone cream (KENALOG) 0.1 % Apply 1 application topically 2 (two) times daily as needed (itching). Patient not taking: Reported on 10/09/2016 10/26/13   Lucretia Kern, DO    Family History Family History  Problem Relation Age of Onset  . Arthritis Mother   . Heart disease Father   . Hyperlipidemia Father   . Hypertension Father   . Diabetes Sister   . Crohn's disease Daughter     Social History Social History  Substance Use Topics  . Smoking status: Never Smoker  . Smokeless tobacco: Never Used  . Alcohol use  Yes     Comment: occ glass of wine      Allergies   Bactrim [sulfamethoxazole-trimethoprim]; Clindamycin/lincomycin; Macrobid [nitrofurantoin macrocrystal]; Penicillins; and Sulfa antibiotics   Review of Systems Review of Systems Ten systems reviewed and are negative for acute change, except as noted in the HPI.    Physical Exam Updated Vital Signs BP (!) 175/75 (BP Location: Right Arm)   Pulse 71   Temp 98 F (36.7 C) (Oral)   Resp 18   Ht 5' (1.524 m)   Wt 47.6 kg (105 lb)   SpO2 96%   BMI 20.51 kg/m   Physical Exam  Constitutional: She is oriented to person, place, and time. She appears well-developed and well-nourished. No distress.  Appears uncomfortable  HENT:  Head:  Normocephalic and atraumatic.  Eyes: Conjunctivae are normal. No scleral icterus.  Neck: Normal range of motion.  Cardiovascular: Normal rate, regular rhythm and normal heart sounds.  Exam reveals no gallop and no friction rub.   No murmur heard. Pulmonary/Chest: Effort normal and breath sounds normal. No respiratory distress.  Abdominal: Soft. Bowel sounds are normal. She exhibits no distension and no mass. There is no tenderness. There is no guarding.  Musculoskeletal:  Thoracic spine kyphosis and levoscoliosis palpable. Patient has exquisite tenderness to midline palpation of the lumbar spine. She is also tender over the right posterior iliac crest. No sacral tenderness.  Neurological: She is alert and oriented to person, place, and time.  Skin: Skin is warm and dry. She is not diaphoretic.  Psychiatric: Her behavior is normal.  Nursing note and vitals reviewed.    ED Treatments / Results  Labs (all labs ordered are listed, but only abnormal results are displayed) Labs Reviewed  BASIC METABOLIC PANEL - Abnormal; Notable for the following:       Result Value   Glucose, Bld 126 (*)    All other components within normal limits  CBC WITH DIFFERENTIAL/PLATELET    EKG  EKG Interpretation None       Radiology Dg Chest 2 View  Result Date: 10/09/2016 CLINICAL DATA:  Fall in bathtub. EXAM: CHEST  2 VIEW COMPARISON:  Radiographs of April 30, 2013. FINDINGS: Stable cardiomediastinal silhouette. No pneumothorax or pleural effusion is noted. Probable eventration of right hemidiaphragm is noted. No acute pulmonary disease is noted. No acute abnormality seen in the bony thorax. IMPRESSION: No active cardiopulmonary disease. Electronically Signed   By: Marijo Conception, M.D.   On: 10/09/2016 14:54   Dg Thoracic Spine W/swimmers  Result Date: 10/09/2016 CLINICAL DATA:  Upper back pain after fall in tub. EXAM: THORACIC SPINE - 3 VIEWS COMPARISON:  Radiographs of April 30, 2013.  FINDINGS: Moderate levoscoliosis of lower thoracic spine is noted. Mild wedge compression deformity of T10 vertebral body is noted which appears to be old and consistent with old compression fracture. No acute fracture or spondylolisthesis is noted. Mild anterior osteophyte formation is noted in mid thoracic spine. IMPRESSION: Probable T10 compression fracture. No acute abnormality seen in the thoracic spine. Electronically Signed   By: Marijo Conception, M.D.   On: 10/09/2016 14:52   Dg Lumbar Spine Complete  Result Date: 10/09/2016 CLINICAL DATA:  Acute lower back pain after fall in tub. EXAM: LUMBAR SPINE - COMPLETE 4+ VIEW COMPARISON:  None. FINDINGS: Moderate dextroscoliosis of lumbar spine is noted. Mild degenerative disc disease is noted at L2-3 L3-4 and L4-5 with anterior osteophyte formation. No fracture or spondylolisthesis is noted. IMPRESSION: Multilevel degenerative disc disease.  No acute abnormality seen in the lumbar spine. Electronically Signed   By: Marijo Conception, M.D.   On: 10/09/2016 14:49   Dg Hips Bilat W Or Wo Pelvis 3-4 Views  Result Date: 10/09/2016 CLINICAL DATA:  Bilateral hip pain after fall in tail. EXAM: DG HIP (WITH OR WITHOUT PELVIS) 3-4V BILAT COMPARISON:  None. FINDINGS: There is no evidence of hip fracture or dislocation. There is no evidence of arthropathy or other focal bone abnormality. IMPRESSION: Normal bilateral hips. Electronically Signed   By: Marijo Conception, M.D.   On: 10/09/2016 14:56    Procedures Procedures (including critical care time)  Medications Ordered in ED Medications  ondansetron (ZOFRAN) injection 4 mg (4 mg Intravenous Given 10/09/16 1337)  fentaNYL (SUBLIMAZE) injection 50 mcg (50 mcg Intravenous Given 10/09/16 1345)  ondansetron (ZOFRAN) injection 4 mg (4 mg Intravenous Given 10/09/16 1550)     Initial Impression / Assessment and Plan / ED Course  I have reviewed the triage vital signs and the nursing notes.  Pertinent labs & imaging  results that were available during my care of the patient were reviewed by me and considered in my medical decision making (see chart for details).     Georgiann Mccoy Has poorly controlled pain after her mechanical fall. Her x-ray is negative for any acute fractures. Of note, she does have a T10 compression fracture which appears old. She is not point tender over this region, however, is point tender in the mid lumbar region. Because of this, I suspect occult fracture. She is receiving a CT scan of the lumbar area. I've given sign out to Dr. Eulis Foster who will assume care of the patient.  Final Clinical Impressions(s) / ED Diagnoses   Final diagnoses:  Fall    New Prescriptions New Prescriptions   No medications on file     Margarita Mail, PA-C 10/09/16 1613    Davonna Belling, MD 10/14/16 [phone]

## 2016-10-10 DIAGNOSIS — M5440 Lumbago with sciatica, unspecified side: Secondary | ICD-10-CM | POA: Diagnosis not present

## 2016-10-10 DIAGNOSIS — S2231XA Fracture of one rib, right side, initial encounter for closed fracture: Secondary | ICD-10-CM | POA: Diagnosis not present

## 2016-10-10 DIAGNOSIS — R739 Hyperglycemia, unspecified: Secondary | ICD-10-CM | POA: Diagnosis present

## 2016-10-10 LAB — CBC
HCT: 36.8 % (ref 36.0–46.0)
HEMOGLOBIN: 11.9 g/dL — AB (ref 12.0–15.0)
MCH: 31.2 pg (ref 26.0–34.0)
MCHC: 32.3 g/dL (ref 30.0–36.0)
MCV: 96.3 fL (ref 78.0–100.0)
Platelets: 226 10*3/uL (ref 150–400)
RBC: 3.82 MIL/uL — AB (ref 3.87–5.11)
RDW: 13.1 % (ref 11.5–15.5)
WBC: 7.7 10*3/uL (ref 4.0–10.5)

## 2016-10-10 LAB — BASIC METABOLIC PANEL
Anion gap: 8 (ref 5–15)
BUN: 14 mg/dL (ref 6–20)
CALCIUM: 8.8 mg/dL — AB (ref 8.9–10.3)
CHLORIDE: 103 mmol/L (ref 101–111)
CO2: 26 mmol/L (ref 22–32)
CREATININE: 0.61 mg/dL (ref 0.44–1.00)
GFR calc non Af Amer: >60 mL/min (ref >60)
GLUCOSE: 108 mg/dL — AB (ref 65–99)
Potassium: 3.4 mmol/L — ABNORMAL LOW (ref 3.5–5.1)
Sodium: 137 mmol/L (ref 135–145)

## 2016-10-10 MED ORDER — POTASSIUM CHLORIDE CRYS ER 20 MEQ PO TBCR
40.0000 meq | EXTENDED_RELEASE_TABLET | Freq: Once | ORAL | Status: AC
  Administered 2016-10-10: 40 meq via ORAL
  Filled 2016-10-10: qty 2

## 2016-10-10 NOTE — Evaluation (Signed)
Occupational Therapy Evaluation Patient Details Name: Lori [name] MRN: [id number] DOB: 1919/05/09 Today's Date: 10/10/2016    History of Present Illness Patient is a 83 yo female s/p Mechanical fall resulting in rib and L1-3 transverse process fractures.   Clinical Impression   PTA, pt was independent with assistive devices for ADL and functional mobility. Pt currently requires close supervision for toilet transfers and LB ADL with VC's for techniques to protect spine. Educated pt and daughter concerning dressing, bathing, and toileting techniques to adhere to back precautions during daily ADL routine and they verbalize and demonstrate understanding. Additionally educated pt and daughter concerning energy conservation and fall prevention strategies in the home and they verbalized understanding. Recommend home health OT services post-acute D/C in order to improve independence and safety with ADL post-acute D/C. OT will continue to follow while admitted.     Follow Up Recommendations  Home health OT;Supervision - Intermittent    Equipment Recommendations  3 in 1 bedside commode    Recommendations for Other Services       Precautions / Restrictions Precautions Precautions: Back Precaution Comments: educated on techniques for back precautions Restrictions Weight Bearing Restrictions: No      Mobility Bed Mobility Overal bed mobility: Needs Assistance Bed Mobility: Rolling;Sidelying to Sit Rolling: Min guard Sidelying to sit: Min assist       General bed mobility comments: Min guard to maintain spine alignment in rolling and min assist to raise trunk from bed.   Transfers Overall transfer level: Needs assistance Equipment used: Rolling walker (2 wheeled) Transfers: Sit to/from Stand Sit to Stand: Supervision         General transfer comment: VCs for hand placmenet, no physical assist required, increased time to perform    Balance Overall balance assessment: History  of Falls                                         ADL either performed or assessed with clinical judgement   ADL Overall ADL's : Needs assistance/impaired Eating/Feeding: Set up;Sitting   Grooming: Supervision/safety;Standing   Upper Body Bathing: Set up;Sitting   Lower Body Bathing: Supervison/ safety;Sit to/from stand   Upper Body Dressing : Set up;Sitting   Lower Body Dressing: Supervision/safety;Sit to/from stand   Toilet Transfer: Ambulation;RW;BSC;Supervision/safety (over toilet)   Toileting- Clothing Manipulation and Hygiene: Supervision/safety;Sit to/from stand;Adhering to back precautions       Functional mobility during ADLs: Rolling walker;Supervision/safety General ADL Comments: Educated pt on compensatory strategies for dressing, grooming, and toileting tasks while maintaining back precautions. Pt able to cross B LE over knees for LB dressing and demonstrates good understanding of technique to avoid twisting during toileting hygiene.      Vision Patient Visual Report: No change from baseline Vision Assessment?: No apparent visual deficits     Perception     Praxis      Pertinent Vitals/Pain Pain Assessment: Faces Faces Pain Scale: Hurts even more Pain Location: right back with increase during bed mobility Pain Descriptors / Indicators: Sore;Sharp Pain Intervention(s): Monitored during session     Hand Dominance Right   Extremity/Trunk Assessment Upper Extremity Assessment Upper Extremity Assessment: Overall WFL for tasks assessed   Lower Extremity Assessment Lower Extremity Assessment: Overall WFL for tasks assessed   Cervical / Trunk Assessment Cervical / Trunk Assessment: Kyphotic   Communication Communication Communication: HOH   Cognition Arousal/Alertness: Awake/alert Behavior During  Therapy: WFL for tasks assessed/performed Overall Cognitive Status: Within Functional Limits for tasks assessed                                      General Comments       Exercises     Shoulder Instructions      Home Living Family/patient expects to be discharged to:: Private residence Living Arrangements: Children Available Help at Discharge: Family;Available PRN/intermittently Type of Home: House Home Access: Stairs to enter [employer] of Steps: 2 Entrance Stairs-Rails: Left       Bathroom Shower/Tub: Tub/shower unit;Walk-in shower   Bathroom Toilet: Standard     Home Equipment: Walker - 2 wheels          Prior Functioning/Environment Level of Independence: Independent with assistive device(s)        Comments: ocassional use of cane        OT Problem List: Decreased activity tolerance;Impaired balance (sitting and/or standing);Decreased knowledge of use of DME or AE;Decreased knowledge of precautions;Pain      OT Treatment/Interventions: Self-care/ADL training;Therapeutic exercise;Energy conservation;DME and/or AE instruction;Therapeutic activities;Patient/family education;Balance training    OT Goals(Current goals can be found in the care plan section) Acute Rehab OT Goals Patient Stated Goal: to not have any pain OT Goal Formulation: With patient/family Time For Goal Achievement: 10/24/16 Potential to Achieve Goals: Good ADL Goals Pt Will Perform Grooming: with modified independence;standing Pt Will Perform Lower Body Bathing: with modified independence;sit to/from stand Pt Will Perform Lower Body Dressing: with modified independence;sit to/from stand Pt Will Transfer to Toilet: with modified independence;ambulating;bedside commode (over toilet) Pt Will Perform Toileting - Clothing Manipulation and hygiene: with modified independence;sit to/from stand Pt Will Perform Tub/Shower Transfer: with modified independence;ambulating;rolling walker;shower seat;Tub transfer  OT Frequency: Min 2X/week   Barriers to D/C:            Co-evaluation               AM-PAC PT "6 Clicks" Daily Activity     Outcome Measure Help from another person eating meals?: None Help from another person taking care of personal grooming?: A Little Help from another person toileting, which includes using toliet, bedpan, or urinal?: A Little Help from another person bathing (including washing, rinsing, drying)?: A Little Help from another person to put on and taking off regular upper body clothing?: None Help from another person to put on and taking off regular lower body clothing?: A Little 6 Click Score: 20   End of Session Equipment Utilized During Treatment: Gait belt;Rolling walker Nurse Communication: Mobility status  Activity Tolerance: Patient tolerated treatment well Patient left: in chair;with call bell/phone within reach;with family/visitor present (with RN case manager present)  OT Visit Diagnosis: Other abnormalities of gait and mobility (R26.89);Pain Pain - Right/Left: Right Pain - part of body: Leg (back)                Time: 2703-5009 OT Time Calculation (min): 37 min Charges:  OT General Charges $OT Visit: 1 Procedure OT Evaluation $OT Eval Moderate Complexity: 1 Procedure OT Treatments $Self Care/Home Management : 8-22 mins G-Codes: OT G-codes **NOT FOR INPATIENT CLASS** Functional Assessment Tool Used: Clinical judgement Functional Limitation: Self care Self Care Current Status (F8182): At least 1 percent but less than 20 percent impaired, limited or restricted Self Care Goal Status (X9371): 0 percent impaired, limited or restricted   [employer],  MS OTR/L  Pager: [place] 10/10/2016, 5:38 PM

## 2016-10-10 NOTE — Progress Notes (Signed)
PT Cancellation Note  Patient Details Name: JENISSA TYRELL MRN: [id number] DOB: [birth year]   Cancelled Treatment:    Reason Eval/Treat Not Completed: Other (comment). Called daughter per request, daughter asking that PT hold evaluation until she can arrive later today. Will re-attempt at that time.   Duncan Dull 10/10/2016, 9:49 AM  Alben Deeds, PT DPT  [phone]

## 2016-10-10 NOTE — Progress Notes (Signed)
@IPLOG         PROGRESS NOTE                                                                                                                                                                                                             Patient Demographics:    Lori [name], is a 83 y.o. female, DOB - 09/06/1918, XBW:[id number]  Admit date - 10/09/2016   Admitting Physician Karmen Bongo, MD  Outpatient Primary MD for the patient is Lucretia Kern, DO  LOS - 0  Chief Complaint  Patient presents with  . Fall       Brief Narrative  Lori [name] is a 83 y.o. female with medical history significant of remote TIA; remote endometrial and colon CA; and HTN and macular degeneration presenting after a mechanical fall in the bathroom today.  She fell with her back to the tub and she slammed back against the tub. Severe pain since the fall. Further workup which included CT scan of her L-spine revealed Acute mildly displaced fractures of the right 12 rib as well as the right L1-L3 transverse processes.    Subjective:    Lori [name] today has, No headache, No chest pain, No abdominal pain - No Nausea, No new weakness tingling or numbness, No Cough - SOB. Mild back pain.   Assessment  & Plan :     1.Mechanical fall causing - Acute mildly displaced fractures of the right 12 rib as well as the right L1-L3 transverse processes - with resulting back pain, continue supportive care, PT eval, patient requests placement through self-pay which will be looked into. Likely discharge tomorrow if better.  2. Hypertension. On his inhibitor and stable.  3. Hypothyroidism. Stable on Synthroid. TSH stable.  4. Hypokalemia. Replaced.    Diet : Diet regular Room service appropriate? Yes; Fluid consistency: Thin    Family Communication  : None  Code Status :  Full  Disposition Plan  :  TBD  Consults  :  None  Procedures  :    CT L spine - Acute mildly displaced fractures of the right 12 rib as  well as the right L1-L3 transverse processes.   DVT Prophylaxis  :  SCDs    Lab Results  Component Value Date   PLT 226 10/10/2016    Inpatient Medications  Scheduled Meds: . docusate sodium  100 mg Oral BID  . [START ON 10/12/2016] levothyroxine  100 mcg Oral Once per day on Mon Fri   And  .  levothyroxine  50 mcg Oral Once per day on Sun Tue Wed Thu Sat  . lidocaine  2 patch Transdermal Q24H  . lisinopril  20 mg Oral Daily   Continuous Infusions: . methocarbamol (ROBAXIN)  IV Stopped (10/10/16 0141)   PRN Meds:.acetaminophen **OR** [DISCONTINUED] acetaminophen, methocarbamol (ROBAXIN)  IV, promethazine  Antibiotics  :    Anti-infectives    None         Objective:   Vitals:   10/09/16 2036 10/10/16 0120 10/10/16 0506 10/10/16 1000  BP: (!) 166/84 133/65 129/61 137/64  Pulse: 83 75 77 67  Resp: 20 20 20 18   Temp: 98.7 F (37.1 C) 98.5 F (36.9 C) 98.9 F (37.2 C) 98.6 F (37 C)  TempSrc: Oral Oral Oral Oral  SpO2: 97% 95% 97% 95%  Weight: 49 kg (108 lb 0.4 oz)     Height: 5' (1.524 m)       Wt Readings from Last 3 Encounters:  10/09/16 49 kg (108 lb 0.4 oz)  09/03/16 50.8 kg (112 lb)  08/07/16 51.4 kg (113 lb 4.8 oz)     Intake/Output Summary (Last 24 hours) at 10/10/16 1404 Last data filed at 10/10/16 0321  Gross per 24 hour  Intake              605 ml  Output                0 ml  Net              605 ml     Physical Exam  Awake Alert,   No new F.N deficits, Normal affect [place].AT,PERRAL Supple Neck,No JVD, No cervical lymphadenopathy appriciated.  Symmetrical Chest wall movement, Good air movement bilaterally, CTAB RRR,No Gallops,Rubs or new Murmurs, No Parasternal Heave +ve B.Sounds, Abd Soft, No tenderness, No organomegaly appriciated, No rebound - guarding or rigidity. No Cyanosis, Clubbing or edema, No new Rash or bruise      Data Review:    CBC  Recent Labs Lab 10/09/16 1340 10/10/16 0313  WBC 7.6 7.7  HGB 12.5 11.9*  HCT 38.5  36.8  PLT 218 226  MCV 96.0 96.3  MCH 31.2 31.2  MCHC 32.5 32.3  RDW 13.2 13.1  LYMPHSABS 0.9  --   MONOABS 0.6  --   EOSABS 0.1  --   BASOSABS 0.0  --     Chemistries   Recent Labs Lab 10/09/16 1340 10/10/16 0313  NA 138 137  K 3.7 3.4*  CL 104 103  CO2 24 26  GLUCOSE 126* 108*  BUN 14 14  CREATININE 0.61 0.61  CALCIUM 8.9 8.8*   ------------------------------------------------------------------------------------------------------------------ No results for input(s): CHOL, HDL, LDLCALC, TRIG, CHOLHDL, LDLDIRECT in the last 72 hours.  No results found for: HGBA1C ------------------------------------------------------------------------------------------------------------------ No results for input(s): TSH, T4TOTAL, T3FREE, THYROIDAB in the last 72 hours.  Invalid input(s): FREET3 ------------------------------------------------------------------------------------------------------------------ No results for input(s): VITAMINB12, FOLATE, FERRITIN, TIBC, IRON, RETICCTPCT in the last 72 hours.  Coagulation profile No results for input(s): INR, PROTIME in the last 168 hours.  No results for input(s): DDIMER in the last 72 hours.  Cardiac Enzymes No results for input(s): CKMB, TROPONINI, MYOGLOBIN in the last 168 hours.  Invalid input(s): CK ------------------------------------------------------------------------------------------------------------------ No results found for: BNP  Micro Results No results found for this or any previous visit (from the past 240 hour(s)).  Radiology Reports Dg Chest 2 View  Result Date: 10/09/2016 CLINICAL DATA:  Fall in bathtub. EXAM: CHEST  2 VIEW COMPARISON:  Radiographs of April 30, 2013. FINDINGS: Stable cardiomediastinal silhouette. No pneumothorax or pleural effusion is noted. Probable eventration of right hemidiaphragm is noted. No acute pulmonary disease is noted. No acute abnormality seen in the bony thorax. IMPRESSION: No  active cardiopulmonary disease. Electronically Signed   By: Marijo Conception, M.D.   On: 10/09/2016 14:54   Dg Thoracic Spine W/swimmers  Result Date: 10/09/2016 CLINICAL DATA:  Upper back pain after fall in tub. EXAM: THORACIC SPINE - 3 VIEWS COMPARISON:  Radiographs of April 30, 2013. FINDINGS: Moderate levoscoliosis of lower thoracic spine is noted. Mild wedge compression deformity of T10 vertebral body is noted which appears to be old and consistent with old compression fracture. No acute fracture or spondylolisthesis is noted. Mild anterior osteophyte formation is noted in mid thoracic spine. IMPRESSION: Probable T10 compression fracture. No acute abnormality seen in the thoracic spine. Electronically Signed   By: Marijo Conception, M.D.   On: 10/09/2016 14:52   Dg Lumbar Spine Complete  Result Date: 10/09/2016 CLINICAL DATA:  Acute lower back pain after fall in tub. EXAM: LUMBAR SPINE - COMPLETE 4+ VIEW COMPARISON:  None. FINDINGS: Moderate dextroscoliosis of lumbar spine is noted. Mild degenerative disc disease is noted at L2-3 L3-4 and L4-5 with anterior osteophyte formation. No fracture or spondylolisthesis is noted. IMPRESSION: Multilevel degenerative disc disease. No acute abnormality seen in the lumbar spine. Electronically Signed   By: Marijo Conception, M.D.   On: 10/09/2016 14:49   Ct Lumbar Spine Wo Contrast  Result Date: 10/09/2016 CLINICAL DATA:  83 y/o F; is fall with injury to lower back against that level. EXAM: CT LUMBAR SPINE WITHOUT CONTRAST TECHNIQUE: Multidetector CT imaging of the lumbar spine was performed without intravenous contrast administration. Multiplanar CT image reconstructions were also generated. COMPARISON:  07/13/2013 CT abdomen and pelvis. FINDINGS: Segmentation: 5 lumbar type vertebrae. Alignment: Moderate lumbar dextrocurvature with apex at L3. Grade 1 L4-5 anterolisthesis. Vertebrae: Right L1-L3 transverse process mildly displaced fractures. Mildly displaced  fracture of the right posterior twelfth rib. Paraspinal and other soft tissues: Mild dependent atelectasis in the lung bases. Bilateral adrenal gland thickening. Calcific atherosclerosis of the abdominal aorta. Cholecystectomy. Soft tissue stranding in the right flank soft tissues compatible with contusion, no large hematoma identified. Disc levels: Advanced lumbar spondylosis with disc and facet degenerative changes. Canal stenosis is greatest at the L4-5 level where it is at least moderate. IMPRESSION: 1. Acute mildly displaced fractures of the right 12 rib as well as the right L1-L3 transverse processes. 2. No additional acute fracture identified. 3. Right flank mild soft tissue contusion. No large hematoma identified. 4. Stable moderate lumbar dextrocurvature and L4-5 grade 1 anterolisthesis. 5. Stable lumbar spondylosis with at least moderate canal stenosis the L4-5 level. Electronically Signed   By: Kristine Garbe M.D.   On: 10/09/2016 16:31   Dg Hips Bilat W Or Wo Pelvis 3-4 Views  Result Date: 10/09/2016 CLINICAL DATA:  Bilateral hip pain after fall in tail. EXAM: DG HIP (WITH OR WITHOUT PELVIS) 3-4V BILAT COMPARISON:  None. FINDINGS: There is no evidence of hip fracture or dislocation. There is no evidence of arthropathy or other focal bone abnormality. IMPRESSION: Normal bilateral hips. Electronically Signed   By: Marijo Conception, M.D.   On: 10/09/2016 14:56    Time Spent in minutes  30   Lala Lund M.D on 10/10/2016 at 2:04 PM  Between 7am to 7pm - Pager - [phone] ( page via [email or web address], text pages only,  please mention full 10 digit call back number). After 7pm go to [email or web address] - password [place]

## 2016-10-10 NOTE — Care Management Note (Signed)
Case Management Note  Patient Details  Name: LANDRA HOWZE MRN: [id number] Date of Birth: [birth year]  Subjective/Objective:  Pt in with a fall from home. She lives with her daughter that works.                   Action/Plan: PT recommending [facility] services. Patient and daughter are considering private pay SNF vs home with [facility]. CM and CSW have met with them multiple times today and they want to think over night an have decision in the am.  CM did provide the daughter with a private duty list in case they decide to go home and need extra help at home.  CM will follow up with them in am.   Expected Discharge Date:                  Expected Discharge Plan:     In-House Referral:     Discharge planning Services     Post Acute Care Choice:    Choice offered to:     DME Arranged:    DME Agency:     HH Arranged:    [place] Agency:     Status of Service:  In process, will continue to follow  If discussed at Long Length of Stay Meetings, dates discussed:    Additional Comments:  Pollie Friar, RN 10/10/2016, 4:40 PM

## 2016-10-10 NOTE — Progress Notes (Signed)
Pt's daughter called by the patient to pick her up for discharge.  When daughter arrived, she talked to Probation officer and Abbe Amsterdam, RN, Camera operator, informing that the patient was upset and wanted to go home.  Pt has appeared fine and cooperatieve all day. It was explained to daughter and patient that there was no discharge order and that if she left it would be AMA.  Daughter and patient had previously requested to stay one more night to make a decision about disposition to home with [facility] vs SNF.  Writer spoke with patient about why she had changed her mind.  She stated she was upset with the NT's who responded to her bed alarm when she got out of bed without assistance.  She stated that she had called for assistance but apparently could not wait until staff arrived.  She also verbalized that she was "scared" but refused to elaborate.  Writer offered to change staff members assigned to her but she refused that, as well, stating she was going home.  Daughter in agreement.  Abbe Amsterdam, RN to call on-call MD and facilitate her leaving.

## 2016-10-10 NOTE — NC FL2 (Signed)
[place] LEVEL OF CARE SCREENING TOOL     IDENTIFICATION  Patient Name: UNICE VANTASSEL Birthdate: 1918/05/15 Sex: female Admission Date (Current Location): 10/09/2016  [facility] and [place] Number:  Herbalist and Address:  [facility] [place] [address] [place], [place] [address]      Provider Number: [id number]  Attending Physician Name and Address:  Thurnell Lose, MD  Relative Name and Phone Number:       Current Level of Care: Hospital Recommended Level of Care: [place] Prior Approval Number:    Date Approved/Denied:   PASRR Number: [id number]  Discharge Plan: SNF    Current Diagnoses: Patient Active Problem List   Diagnosis Date Noted  . Hyperglycemia 10/10/2016  . Back pain 10/09/2016  . Change in bowel habits 07/24/2013  . History of colon cancer 07/24/2013  . History of endometrial cancer 07/24/2013  . Loss of weight 07/24/2013  . Cystitis 03/16/2013  . Essential hypertension, benign 11/24/2012  . Hx of TIA (transient ischemic attack)  11/24/2012  . Hypothyroidism 11/24/2012    Orientation RESPIRATION BLADDER Height & Weight     Self, Time, Situation, Place  Normal Continent Weight: 108 lb 0.4 oz (49 kg) Height:  5' (152.4 cm)  BEHAVIORAL SYMPTOMS/MOOD NEUROLOGICAL BOWEL NUTRITION STATUS      Continent    AMBULATORY STATUS COMMUNICATION OF NEEDS Skin   Limited Assist Verbally Normal                       Personal Care Assistance Level of Assistance  Bathing, Dressing Bathing Assistance: Limited assistance   Dressing Assistance: Limited assistance     Functional Limitations Info  Sight Sight Info: Impaired (macular degeneration)        SPECIAL CARE FACTORS FREQUENCY  PT (By licensed PT), OT (By licensed OT)     PT Frequency: will assess OT Frequency: will assess            Contractures      Additional Factors Info  Code Status, Allergies Code Status Info:  DNR Allergies Info: Bactrim Sulfamethoxazole-trimethoprim, Clindamycin/lincomycin, Macrobid Nitrofurantoin Macrocrystal, Penicillins, Sulfa Antibiotics           Current Medications (10/10/2016):  This is the current hospital active medication list Current Facility-Administered Medications  Medication Dose Route Frequency Provider Last Rate Last Dose  . acetaminophen (TYLENOL) tablet 650 mg  650 mg Oral Q6H PRN Karmen Bongo, MD   650 mg at 10/10/16 0825  . docusate sodium (COLACE) capsule 100 mg  100 mg Oral BID Karmen Bongo, MD   100 mg at 10/10/16 1110  . [START ON 10/12/2016] levothyroxine (SYNTHROID, LEVOTHROID) tablet 100 mcg  100 mcg Oral Once per day on Mon Fri Yates, Jennifer, MD       And  . levothyroxine (SYNTHROID, LEVOTHROID) tablet 50 mcg  50 mcg Oral Once per day on Sun Tue Wed Thu Sat Karmen Bongo, MD   50 mcg at 10/10/16 0825  . lidocaine (LIDODERM) 5 % 2 patch  2 patch Transdermal Q24H Karmen Bongo, MD   2 patch at 10/09/16 2128  . lisinopril (PRINIVIL,ZESTRIL) tablet 20 mg  20 mg Oral Daily Karmen Bongo, MD   20 mg at 10/10/16 1109  . methocarbamol (ROBAXIN) 500 mg in dextrose 5 % 50 mL IVPB  500 mg Intravenous Q6H PRN Karmen Bongo, MD   Stopped at 10/10/16 0141  . promethazine (PHENERGAN) tablet 12.5 mg  12.5 mg Oral Q6H PRN Karmen Bongo, MD         Discharge Medications: Please see discharge summary for a list of discharge medications.  Relevant Imaging Results:  Relevant Lab Results:   Additional Information SS#: [id number]  Geralynn Ochs, LCSW

## 2016-10-10 NOTE — Progress Notes (Signed)
CSW consulted for possible SNF placement. CSW explained to pt and pt's daughter that without a qualifying stay, Medicare will not pay for SNF placement and it would be private pay. CSW provided list of facilities and faxed out referral for possible placement options. Pt and pt's daughter discussed PT recommendations for home therapy, and discussed options. Pt and pt's daughter agreed to work with home health and identify ways to improve the home environment in order to make it more accessible and less likely for the patient to have another fall. Pt and pt's daughter did not want to look into SNF placement at this time. Pt's daughter expressed concern about possibly having to discuss transition to ALF in the future, but how that may be in [place] because that is where a lot of the pt's friends are. CSW to provide list of ALF facilities in the local area for the pt and pt's daughter to have as a reference for future needs.  CSW no longer needed to facilitate discharge planning. CSW signing off.  Laveda Abbe LCSW [phone]

## 2016-10-10 NOTE — Evaluation (Signed)
Physical Therapy Evaluation Patient Details Name: Lori [name] MRN: [id number] DOB: [birth year] Today's Date: 10/10/2016   History of Present Illness  Patient is a 83 yo female s/p Mechanical fall resulting in rib and L1-3 transverse process fractures.  Clinical Impression  Orders received for PT evaluation. Patient demonstrates deficits in functional mobility as indicated below. Will benefit from continued skilled PT to address deficits and maximize function. Will see as indicated and progress as tolerated.  Recommend HHPT for in home safety evaluation and balance activities. Educated patient on precautions and tips for pain management. Patient reluctant to go home until she has no pain. Attempted to educate patient regarding injury and that some degree of pain is to be expected. Patient is mobilizing well requiring minimal assist for bed mobility but otherwise supervision. Spoke with patient and daughter regarding recommendations. Patient reluctant to go home despite current functional level. Recommend OT consult to assist with confidence in activities prior to discharge.  Will follow acutely.    Follow Up Recommendations Home health PT;Supervision - Intermittent    Equipment Recommendations  Rolling walker with 5" wheels    Recommendations for Other Services       Precautions / Restrictions Precautions Precautions: Back Precaution Comments: educated on techniques for back precautions      Mobility  Bed Mobility Overal bed mobility: Needs Assistance Bed Mobility: Rolling;Sidelying to Sit Rolling: Supervision Sidelying to sit: Min assist       General bed mobility comments: Min assist to elevate trunk to upright  Transfers Overall transfer level: Needs assistance Equipment used: Rolling walker (2 wheeled) Transfers: Sit to/from Stand Sit to Stand: Supervision         General transfer comment: VCs for hand placmenet, no physical assist required, increased time to  perform  Ambulation/Gait Ambulation/Gait assistance: Supervision Ambulation Distance (Feet): 120 Feet Assistive device: Rolling walker (2 wheeled) Gait Pattern/deviations: Step-through pattern;Decreased stride length;Narrow base of support Gait velocity: decreased intially Gait velocity interpretation: Below normal speed for age/gender General Gait Details: one standing rest break, VCs for increased cadence, no physical assist required  Stairs            Wheelchair Mobility    Modified Rankin (Stroke Patients Only)       Balance Overall balance assessment: History of Falls                                           Pertinent Vitals/Pain Pain Assessment: Faces Faces Pain Scale: Hurts little more Pain Location: right back Pain Descriptors / Indicators: Sore;Sharp Pain Intervention(s): Monitored during session    Home Living Family/patient expects to be discharged to:: Private residence Living Arrangements: Children Available Help at Discharge: Family;Available PRN/intermittently Type of Home: House Home Access: Stairs to enter Entrance Stairs-Rails: Left Entrance Stairs-Number of Steps: 2   Home Equipment: Walker - 2 wheels      Prior Function Level of Independence: Independent with assistive device(s)         Comments: ocassional use of cane     Hand Dominance   Dominant Hand: Right    Extremity/Trunk Assessment   Upper Extremity Assessment Upper Extremity Assessment: Defer to OT evaluation    Lower Extremity Assessment Lower Extremity Assessment: Overall WFL for tasks assessed    Cervical / Trunk Assessment Cervical / Trunk Assessment: Kyphotic  Communication   Communication: HOH  Cognition Arousal/Alertness: Awake/alert Behavior  During Therapy: WFL for tasks assessed/performed Overall Cognitive Status: Within Functional Limits for tasks assessed                                        General Comments       Exercises     Assessment/Plan    PT Assessment Patient needs continued PT services  PT Problem List Decreased activity tolerance;Decreased balance;Decreased mobility;Pain       PT Treatment Interventions DME instruction;Gait training;Stair training;Functional mobility training;Therapeutic activities;Therapeutic exercise;Balance training;Patient/family education    PT Goals (Current goals can be found in the Care Plan section)  Acute Rehab PT Goals Patient Stated Goal: to not have any pain PT Goal Formulation: With patient/family Time For Goal Achievement: 10/24/16 Potential to Achieve Goals: Good    Frequency Min 5X/week   Barriers to discharge        Co-evaluation               AM-PAC PT "6 Clicks" Daily Activity  Outcome Measure Difficulty turning over in bed (including adjusting bedclothes, sheets and blankets)?: A Little Difficulty moving from lying on back to sitting on the side of the bed? : Total Difficulty sitting down on and standing up from a chair with arms (e.g., wheelchair, bedside commode, etc,.)?: A Little Help needed moving to and from a bed to chair (including a wheelchair)?: A Little Help needed walking in hospital room?: A Little Help needed climbing 3-5 steps with a railing? : A Little 6 Click Score: 16    End of Session   Activity Tolerance: Patient tolerated treatment well Patient left: in chair;with call bell/phone within reach;with family/visitor present Nurse Communication: Mobility status PT Visit Diagnosis: Unsteadiness on feet (R26.81);Pain Pain - Right/Left: Right Pain - part of body:  (low back and left upper back)    Time: 7096-2836 PT Time Calculation (min) (ACUTE ONLY): 22 min   Charges:   PT Evaluation $PT Eval Moderate Complexity: 1 Procedure     PT G Codes:   PT G-Codes **NOT FOR INPATIENT CLASS** Functional Assessment Tool Used: Clinical judgement Functional Limitation: Mobility: Walking and moving  around Mobility: Walking and Moving Around Current Status ([id number]): At least 1 percent but less than 20 percent impaired, limited or restricted Mobility: Walking and Moving Around Goal Status [phone]): 0 percent impaired, limited or restricted    Alben Deeds, PT DPT  [place] 10/10/2016, 1:24 PM

## 2016-10-11 DIAGNOSIS — S2231XA Fracture of one rib, right side, initial encounter for closed fracture: Secondary | ICD-10-CM | POA: Diagnosis not present

## 2016-10-11 DIAGNOSIS — I10 Essential (primary) hypertension: Secondary | ICD-10-CM | POA: Diagnosis not present

## 2016-10-11 DIAGNOSIS — M545 Low back pain: Secondary | ICD-10-CM | POA: Diagnosis not present

## 2016-10-11 DIAGNOSIS — E039 Hypothyroidism, unspecified: Secondary | ICD-10-CM | POA: Diagnosis not present

## 2016-10-11 LAB — BASIC METABOLIC PANEL
ANION GAP: 12 (ref 5–15)
BUN: 11 mg/dL (ref 6–20)
CALCIUM: 8.6 mg/dL — AB (ref 8.9–10.3)
CO2: 23 mmol/L (ref 22–32)
Chloride: 100 mmol/L — ABNORMAL LOW (ref 101–111)
Creatinine, Ser: 0.6 mg/dL (ref 0.44–1.00)
GFR calc Af Amer: >60 mL/min (ref >60)
GFR calc non Af Amer: >60 mL/min (ref >60)
GLUCOSE: 80 mg/dL (ref 65–99)
POTASSIUM: 3.6 mmol/L (ref 3.5–5.1)
Sodium: 135 mmol/L (ref 135–145)

## 2016-10-11 MED ORDER — HYDROCODONE-ACETAMINOPHEN 5-325 MG PO TABS
2.0000 | ORAL_TABLET | Freq: Once | ORAL | Status: AC
  Administered 2016-10-11: 1 via ORAL
  Filled 2016-10-11: qty 2

## 2016-10-11 MED ORDER — DOCUSATE SODIUM 100 MG PO CAPS: 200.0000 mg | ORAL_CAPSULE | Freq: Two times a day (BID) | ORAL | 0 refills | Status: DC | PRN

## 2016-10-11 MED ORDER — ACETAMINOPHEN 325 MG PO TABS: 650.0000 mg | ORAL_TABLET | Freq: Four times a day (QID) | ORAL | 0 refills | Status: DC | PRN

## 2016-10-11 MED ORDER — IBUPROFEN 400 MG PO TABS: 400.0000 mg | ORAL_TABLET | Freq: Two times a day (BID) | ORAL | 0 refills | Status: DC | PRN

## 2016-10-11 MED ORDER — LIDOCAINE 5 % EX PTCH: 1.0000 | MEDICATED_PATCH | CUTANEOUS | 0 refills | Status: AC

## 2016-10-11 NOTE — Progress Notes (Signed)
Physical Therapy Treatment Patient Details Name: Lori [name] MRN: [id number] DOB: [birth year] Today's Date: 10/11/2016    History of Present Illness Patient is a 83 yo female s/p Mechanical fall resulting in rib and L1-3 transverse process fractures.    PT Comments    Progressing well.  Still tentative with transition movement, but more fluid with gait and gait speed.    Follow Up Recommendations  Supervision - Intermittent;SNF     Equipment Recommendations  Rolling walker with 5" wheels    Recommendations for Other Services       Precautions / Restrictions Precautions Precautions: Fall;Back Precaution Comments: educated on techniques for back precautions    Mobility  Bed Mobility Overal bed mobility: Needs Assistance Bed Mobility: Sit to Sidelying Rolling: Modified independent (Device/Increase time)       Sit to sidelying: Min assist (A to lift BLE onton bed) General bed mobility comments: up at sink on arrival ( with daughter)  Transfers Overall transfer level: Needs assistance Equipment used: Rolling walker (2 wheeled) Transfers: Sit to/from Stand Sit to Stand: Supervision         General transfer comment: good hand placement  Ambulation/Gait Ambulation/Gait assistance: Supervision Ambulation Distance (Feet): 300 Feet Assistive device: Rolling walker (2 wheeled) Gait Pattern/deviations: Step-through pattern   Gait velocity interpretation: at or above normal speed for age/gender General Gait Details: steady with good speed for age.  Using RW stable even scanning around her environment   Stairs            Wheelchair Mobility    Modified Rankin (Stroke Patients Only)       Balance Overall balance assessment: History of Falls                                          Cognition Arousal/Alertness: Awake/alert Behavior During Therapy: WFL for tasks assessed/performed Overall Cognitive Status: Within Functional Limits for  tasks assessed                                        Exercises      General Comments        Pertinent Vitals/Pain Pain Assessment: Faces Faces Pain Scale: Hurts little more Pain Location: r low back and more up on left Pain Descriptors / Indicators: Sore Pain Intervention(s): Monitored during session    Home Living                      Prior Function            PT Goals (current goals can now be found in the care plan section) Acute Rehab PT Goals Patient Stated Goal: to not have any pain PT Goal Formulation: With patient/family Time For Goal Achievement: 10/24/16 Potential to Achieve Goals: Good Progress towards PT goals: Progressing toward goals    Frequency    Min 5X/week      PT Plan Discharge plan needs to be updated    Co-evaluation              AM-PAC PT "6 Clicks" Daily Activity  Outcome Measure  Difficulty turning over in bed (including adjusting bedclothes, sheets and blankets)?: A Little Difficulty moving from lying on back to sitting on the side of the bed? : Total Difficulty sitting down on  and standing up from a chair with arms (e.g., wheelchair, bedside commode, etc,.)?: A Little Help needed moving to and from a bed to chair (including a wheelchair)?: A Little Help needed walking in hospital room?: A Little Help needed climbing 3-5 steps with a railing? : A Little 6 Click Score: 16    End of Session   Activity Tolerance: Patient tolerated treatment well Patient left: Other (comment) (transport chair to get in car.) Nurse Communication: Mobility status PT Visit Diagnosis: Other abnormalities of gait and mobility (R26.89);Pain Pain - Right/Left: Right Pain - part of body:  (back)     Time: 1441-1453 PT Time Calculation (min) (ACUTE ONLY): 12 min  Charges:  $Gait Training: 8-22 mins                    G Codes:  Functional Assessment Tool Used: Clinical judgement Functional Limitation: Mobility: Walking  and moving around Mobility: Walking and Moving Around Discharge Status [phone]): At least 1 percent but less than 20 percent impaired, limited or restricted    10/11/2016  Donnella Sham, PT [phone]  (pager)   Tessie Fass Ayoub Arey 10/11/2016, 3:06 PM

## 2016-10-11 NOTE — Discharge Summary (Signed)
Lori [name] YIA:[id number] DOB: 09/09/1918 DOA: 10/09/2016  PCP: Lucretia Kern, DO  Admit date: 10/09/2016  Discharge date: 10/11/2016  Admitted From: Home Disposition:  SNF versus HH PT, patient still not decided and will make a decision soon.   Recommendations for Outpatient Follow-up:   Follow up with PCP in 1-2 weeks  PCP Please obtain BMP/CBC, 2 view CXR in 1week,  (see Discharge instructions)   PCP Please follow up on the following pending results: Monitor clinically   Home Health: PT   Equipment/Devices: Rolling walker Consultations: None Discharge Condition: Stable   CODE STATUS: DNR  Diet Recommendation:  Heart Healthy    Chief Complaint  Patient presents with  . Fall     Brief history of present illness from the day of admission and additional interim summary    Lori [name] a 83 y.o.femalewith medical history significant of remote TIA; remote endometrial and colon CA; and HTN and macular degeneration presenting after a mechanical fall in the bathroom today. She fell with her back to the tub and she slammed back against the tub. Severe pain since the fall. Further workup which included CT scan of her L-spine revealed Acute mildly displaced fractures of the right 12 rib as well as the right L1-L3 transverse processes.                                                                 Hospital Course   1.Mechanical fall causing - Acute mildly displaced fractures of the right 12 rib as well as the right L1-L3 transverse processes - with resulting back pain, Much better with supportive care, in no discomfort this morning, seen by PT OT, patient is still indecisive whether she would like to go home with home health PT or to SNF, medically stable will be discharged, disposition finally will be  decided based on patient's preference this afternoon..  2. Hypertension. Stable on home regimen.  3. Hypothyroidism. Stable on Synthroid. TSH stable.  4. Hypokalemia. Replaced And stable.     Discharge diagnosis     Principal Problem:   Back pain Active Problems:   Essential hypertension, benign   Hypothyroidism   Hyperglycemia    Discharge instructions    Discharge Instructions    Diet - low sodium heart healthy    Complete by:  As directed    Discharge instructions    Complete by:  As directed    Follow with Primary MD Lucretia Kern, DO in 2-3 days   Get CBC, CMP, 2 view Chest X ray checked  by Primary MD or SNF MD in 2-3 days ( we routinely change or add medications that can affect your baseline labs and fluid status, therefore we recommend that you get the mentioned basic workup next visit with your  PCP, your PCP may decide not to get them or add new tests based on their clinical decision)  Activity: As tolerated with Full fall precautions use walker/cane & assistance as needed  Disposition TBD, patient to decide between home health PT and SNF. Both are arranged.  Diet:   Heart Healthy    For Heart failure patients - Check your Weight same time everyday, if you gain over 2 pounds, or you develop in leg swelling, experience more shortness of breath or chest pain, call your Primary MD immediately. Follow Cardiac Low Salt Diet and 1.5 lit/day fluid restriction.  On your next visit with your primary care physician please Get Medicines reviewed and adjusted.  Please request your Prim.MD to go over all Hospital Tests and Procedure/Radiological results at the follow up, please get all Hospital records sent to your Prim MD by signing hospital release before you go home.  If you experience worsening of your admission symptoms, develop shortness of breath, life threatening emergency, suicidal or homicidal thoughts you must seek medical attention immediately by calling 911 or  calling your MD immediately  if symptoms less severe.  You Must read complete instructions/literature along with all the possible adverse reactions/side effects for all the Medicines you take and that have been prescribed to you. Take any new Medicines after you have completely understood and accpet all the possible adverse reactions/side effects.   Do not drive, operate heavy machinery, perform activities at heights, swimming or participation in water activities or provide baby sitting services if your were admitted for syncope or siezures until you have seen by Primary MD or a Neurologist and advised to do so again.  Do not drive when taking Pain medications.    Do not take more than prescribed Pain, Sleep and Anxiety Medications  Special Instructions: If you have smoked or chewed Tobacco  in the last 2 yrs please stop smoking, stop any regular Alcohol  and or any Recreational drug use.  Wear Seat belts while driving.   Please note  You were cared for by a hospitalist during your hospital stay. If you have any questions about your discharge medications or the care you received while you were in the hospital after you are discharged, you can call the unit and asked to speak with the hospitalist on call if the hospitalist that took care of you is not available. Once you are discharged, your primary care physician will handle any further medical issues. Please note that NO REFILLS for any discharge medications will be authorized once you are discharged, as it is imperative that you return to your primary care physician (or establish a relationship with a primary care physician if you do not have one) for your aftercare needs so that they can reassess your need for medications and monitor your lab values.   Increase activity slowly    Complete by:  As directed       Discharge Medications   Allergies as of 10/11/2016      Reactions   Bactrim [sulfamethoxazole-trimethoprim] Itching   Blisters    Clindamycin/lincomycin Other (See Comments)   GI upset   Macrobid [nitrofurantoin Macrocrystal] Itching   Penicillins Itching   Sulfa Antibiotics Itching      Medication List    TAKE these medications   acetaminophen 325 MG tablet Commonly known as:  TYLENOL Take 2 tablets (650 mg total) by mouth every 6 (six) hours as needed for mild pain (or Fever >/= 101).   docusate sodium 100  MG capsule Commonly known as:  COLACE Take 2 capsules (200 mg total) by mouth 2 (two) times daily as needed for mild constipation.   EYE VITAMINS PO Take 1 tablet by mouth 2 (two) times daily.   levothyroxine 50 MCG tablet Commonly known as:  SYNTHROID, LEVOTHROID TAKE 2 TABLETS ON MONDAY AND FRIDAY. TAKE 1 TABLET ALL OTHER DAYS   lidocaine 5 % Commonly known as:  LIDODERM Place 1 patch onto the skin daily. Remove & Discard patch within 12 hours or as directed by MD   lisinopril 20 MG tablet Commonly known as:  PRINIVIL,ZESTRIL TAKE 1 TABLET (20 MG TOTAL) BY MOUTH DAILY.   VITAMIN B-2 PO Take 1 tablet by mouth daily.   vitamin C 500 MG tablet Commonly known as:  ASCORBIC ACID Take 500 mg by mouth daily.   VITAMIN D PO Take 1 capsule by mouth daily.            Durable Medical Equipment        Start     Ordered   10/11/16 0912  For home use only DME Walker rolling  Once    Comments:  5 wheel  Question:  Patient needs a walker to treat with the following condition  Answer:  Weakness   10/11/16 0911      Follow-up Information    Lucretia Kern, DO. Schedule an appointment as soon as possible for a visit in 2 day(s).   Specialty:  Family Medicine Contact information: [place] [address] [phone]           Major procedures and Radiology Reports - PLEASE review detailed and final reports thoroughly  -         Dg Chest 2 View  Result Date: 10/09/2016 CLINICAL DATA:  Fall in bathtub. EXAM: CHEST  2 VIEW COMPARISON:  Radiographs of April 30, 2013. FINDINGS: Stable cardiomediastinal silhouette. No pneumothorax or pleural effusion is noted. Probable eventration of right hemidiaphragm is noted. No acute pulmonary disease is noted. No acute abnormality seen in the bony thorax. IMPRESSION: No active cardiopulmonary disease. Electronically Signed   By: Marijo Conception, M.D.   On: 10/09/2016 14:54   Dg Thoracic Spine W/swimmers  Result Date: 10/09/2016 CLINICAL DATA:  Upper back pain after fall in tub. EXAM: THORACIC SPINE - 3 VIEWS COMPARISON:  Radiographs of April 30, 2013. FINDINGS: Moderate levoscoliosis of lower thoracic spine is noted. Mild wedge compression deformity of T10 vertebral body is noted which appears to be old and consistent with old compression fracture. No acute fracture or spondylolisthesis is noted. Mild anterior osteophyte formation is noted in mid thoracic spine. IMPRESSION: Probable T10 compression fracture. No acute abnormality seen in the thoracic spine. Electronically Signed   By: Marijo Conception, M.D.   On: 10/09/2016 14:52   Dg Lumbar Spine Complete  Result Date: 10/09/2016 CLINICAL DATA:  Acute lower back pain after fall in tub. EXAM: LUMBAR SPINE - COMPLETE 4+ VIEW COMPARISON:  None. FINDINGS: Moderate dextroscoliosis of lumbar spine is noted. Mild degenerative disc disease is noted at L2-3 L3-4 and L4-5 with anterior osteophyte formation. No fracture or spondylolisthesis is noted. IMPRESSION: Multilevel degenerative disc disease. No acute abnormality seen in the lumbar spine. Electronically Signed   By: Marijo Conception, M.D.   On: 10/09/2016 14:49   Ct Lumbar Spine Wo Contrast  Result Date: 10/09/2016 CLINICAL DATA:  83 y/o F; is fall with injury to lower back against that level.  EXAM: CT LUMBAR SPINE WITHOUT CONTRAST TECHNIQUE: Multidetector CT imaging of the lumbar spine was performed without intravenous contrast administration. Multiplanar CT image reconstructions were also generated. COMPARISON:   07/13/2013 CT abdomen and pelvis. FINDINGS: Segmentation: 5 lumbar type vertebrae. Alignment: Moderate lumbar dextrocurvature with apex at L3. Grade 1 L4-5 anterolisthesis. Vertebrae: Right L1-L3 transverse process mildly displaced fractures. Mildly displaced fracture of the right posterior twelfth rib. Paraspinal and other soft tissues: Mild dependent atelectasis in the lung bases. Bilateral adrenal gland thickening. Calcific atherosclerosis of the abdominal aorta. Cholecystectomy. Soft tissue stranding in the right flank soft tissues compatible with contusion, no large hematoma identified. Disc levels: Advanced lumbar spondylosis with disc and facet degenerative changes. Canal stenosis is greatest at the L4-5 level where it is at least moderate. IMPRESSION: 1. Acute mildly displaced fractures of the right 12 rib as well as the right L1-L3 transverse processes. 2. No additional acute fracture identified. 3. Right flank mild soft tissue contusion. No large hematoma identified. 4. Stable moderate lumbar dextrocurvature and L4-5 grade 1 anterolisthesis. 5. Stable lumbar spondylosis with at least moderate canal stenosis the L4-5 level. Electronically Signed   By: Kristine Garbe M.D.   On: 10/09/2016 16:31   Dg Hips Bilat W Or Wo Pelvis 3-4 Views  Result Date: 10/09/2016 CLINICAL DATA:  Bilateral hip pain after fall in tail. EXAM: DG HIP (WITH OR WITHOUT PELVIS) 3-4V BILAT COMPARISON:  None. FINDINGS: There is no evidence of hip fracture or dislocation. There is no evidence of arthropathy or other focal bone abnormality. IMPRESSION: Normal bilateral hips. Electronically Signed   By: Marijo Conception, M.D.   On: 10/09/2016 14:56    Micro Results     No results found for this or any previous visit (from the past 240 hour(s)).  Today   Subjective    Lori [name] today has no headache,no chest abdominal pain,no new weakness tingling or numbness, feels much better wants to go home today. Improved  low back pain   Objective   Blood pressure (!) 166/71, pulse 73, temperature 98.1 F (36.7 C), temperature source Oral, resp. rate 18, height 5' (1.524 m), weight 49 kg (108 lb 0.4 oz), SpO2 95 %.   Intake/Output Summary (Last 24 hours) at 10/11/16 0918 Last data filed at 10/10/16 1430  Gross per 24 hour  Intake              195 ml  Output                0 ml  Net              195 ml    Exam Awake Alert, Oriented x 3, No new F.N deficits, Normal affect [place].AT,PERRAL Supple Neck,No JVD, No cervical lymphadenopathy appriciated.  Symmetrical Chest wall movement, Good air movement bilaterally, CTAB RRR,No Gallops,Rubs or new Murmurs, No Parasternal Heave +ve B.Sounds, Abd Soft, Non tender, No organomegaly appriciated, No rebound -guarding or rigidity. No Cyanosis, Clubbing or edema, No new Rash or bruise   Data Review   CBC w Diff:  Lab Results  Component Value Date   WBC 7.7 10/10/2016   HGB 11.9 (L) 10/10/2016   HCT 36.8 10/10/2016   PLT 226 10/10/2016   LYMPHOPCT 12 10/09/2016   MONOPCT 8 10/09/2016   EOSPCT 1 10/09/2016   BASOPCT 0 10/09/2016    CMP:  Lab Results  Component Value Date   NA 135 10/11/2016   K 3.6 10/11/2016   CL 100 (L)  10/11/2016   CO2 23 10/11/2016   BUN 11 10/11/2016   CREATININE 0.60 10/11/2016   CREATININE 0.76 03/09/2016   PROT 7.7 07/24/2013   ALBUMIN 4.2 07/24/2013   BILITOT 0.6 07/24/2013   ALKPHOS 73 07/24/2013   AST 29 07/24/2013   ALT 18 07/24/2013  .   Total Time in preparing paper work, data evaluation and todays exam - 44 minutes  Lala Lund M.D on 10/11/2016 at [place]  [phone]

## 2016-10-11 NOTE — Progress Notes (Signed)
Occupational Therapy Treatment Patient Details Name: Lori [name] MRN: [id number] DOB: [birth year] Today's Date: 10/11/2016    History of present illness Patient is a 83 yo female s/p Mechanical fall resulting in rib and L1-3 transverse process fractures.   OT comments  Pt making progress with mobility and ADL. Ambulating with S @ RW level and required min A with bed mobility. . Pt apparently planning to DC to SNF for rehab.   Follow Up Recommendations  SNF    Equipment Recommendations  3 in 1 bedside commode    Recommendations for Other Services      Precautions / Restrictions Precautions Precautions: Fall;Back Precaution Comments: educated on techniques for back precautions       Mobility Bed Mobility Overal bed mobility: Needs Assistance Bed Mobility: Sit to Sidelying Rolling: Modified independent (Device/Increase time)       Sit to sidelying: Min assist (A to lift BLE onton bed)    Transfers Overall transfer level: Needs assistance Equipment used: Rolling walker (2 wheeled)   Sit to Stand: Supervision              Balance Overall balance assessment: History of Falls                                         ADL either performed or assessed with clinical judgement   ADL Overall ADL's : Needs assistance/impaired                         Toilet Transfer: Ambulation;RW;BSC;Supervision/safety   Toileting- Clothing Manipulation and Hygiene: Supervision/safety;Sit to/from stand;Adhering to back precautions       Functional mobility during ADLs: Supervision/safety;Rolling walker;Cueing for safety  Discussed back precautions for ADL. Pt will benefit from further ADL retraining. Pt expressing concern about being at home alone.      Vision       Perception     Praxis      Cognition Arousal/Alertness: Awake/alert Behavior During Therapy: WFL for tasks assessed/performed Overall Cognitive Status: Within Functional Limits  for tasks assessed                                          Exercises     Shoulder Instructions       General Comments      Pertinent Vitals/ Pain       Pain Assessment: Faces Faces Pain Scale: Hurts little more Pain Location: right back with increase during bed mobility Pain Descriptors / Indicators: Sore;Sharp Pain Intervention(s): Limited activity within patient's tolerance  Home Living                                          Prior Functioning/Environment              Frequency  Min 2X/week        Progress Toward Goals  OT Goals(current goals can now be found in the care plan section)  Progress towards OT goals: Progressing toward goals  Acute Rehab OT Goals Patient Stated Goal: to not have any pain OT Goal Formulation: With patient/family Time For Goal Achievement: 10/24/16 Potential to Achieve Goals: Good ADL Goals Pt  Will Perform Grooming: with modified independence;standing Pt Will Perform Lower Body Bathing: with modified independence;sit to/from stand Pt Will Perform Lower Body Dressing: with modified independence;sit to/from stand Pt Will Transfer to Toilet: with modified independence;ambulating;bedside commode Pt Will Perform Toileting - Clothing Manipulation and hygiene: with modified independence;sit to/from stand Pt Will Perform Tub/Shower Transfer: with modified independence;ambulating;rolling walker;shower seat;Tub transfer  Plan Discharge plan needs to be updated    Co-evaluation                 AM-PAC PT "6 Clicks" Daily Activity     Outcome Measure   Help from another person eating meals?: None Help from another person taking care of personal grooming?: A Little Help from another person toileting, which includes using toliet, bedpan, or urinal?: A Little Help from another person bathing (including washing, rinsing, drying)?: A Little Help from another person to put on and taking off regular  upper body clothing?: None Help from another person to put on and taking off regular lower body clothing?: A Little 6 Click Score: 20    End of Session Equipment Utilized During Treatment: Rolling walker  OT Visit Diagnosis: Other abnormalities of gait and mobility (R26.89);Pain Pain - Right/Left: Right Pain - part of body: Leg   Activity Tolerance Patient tolerated treatment well   Patient Left in bed;with call bell/phone within reach;with bed alarm set   Nurse Communication Mobility status        Time: 1130-1145 OT Time Calculation (min): 15 min  Charges: OT General Charges $OT Visit: 1 Procedure OT Treatments $Self Care/Home Management : 8-22 mins  [facility], OT/L  [phone] 10/11/2016   Jina Olenick,HILLARY 10/11/2016, 2:55 PM

## 2016-10-11 NOTE — Progress Notes (Signed)
Discharge orders received.  Discharge instructions and follow-up appointments reviewed with the patient.  VSS upon discharge.  IV removed and education complete.  Transported out via wheelchair.   Kieanna Rollo M, RN 

## 2016-10-11 NOTE — Clinical Social Work Placement (Signed)
   CLINICAL SOCIAL WORK PLACEMENT  NOTE  Date:  10/11/2016  Patient Details  Name: Lori [name] MRN: [id number] Date of Birth: [birth year]  Clinical Social Work is seeking post-discharge placement for this patient at the [place] level of care (*CSW will initial, date and re-position this form in  chart as items are completed):  Yes   Patient/family provided with [facility] Work Department's list of facilities offering this level of care within the geographic area requested by the patient (or if unable, by the patient's family).  Yes   Patient/family informed of their freedom to choose among providers that offer the needed level of care, that participate in Medicare, Medicaid or managed care program needed by the patient, have an available bed and are willing to accept the patient.  Yes   Patient/family informed of [facility]'s ownership interest in [facility] and [facility], as well as of the fact that they are under no obligation to receive care at these facilities.  PASRR submitted to EDS on       PASRR number received on       Existing PASRR number confirmed on       FL2 transmitted to all facilities in geographic area requested by pt/family on       FL2 transmitted to all facilities within larger geographic area on 10/10/16     Patient informed that his/her managed care company has contracts with or will negotiate with certain facilities, including the following:        Yes   Patient/family informed of bed offers received.  Patient chooses bed at [facility]     Physician recommends and patient chooses bed at      Patient to be transferred to [facility] on 10/11/16.  Patient to be transferred to facility by family car     Patient family notified on 10/11/16 of transfer.  Name of family member notified:  [name]     PHYSICIAN       Additional Comment:    _______________________________________________ Geralynn Ochs, [place] 10/11/2016, 2:07 PM

## 2016-10-11 NOTE — Progress Notes (Signed)
Pt requesting to go home, called her daughter to pick her up but no d/c orders in place, daughter requested to call MD for orders, on call person NP Chaney Malling paged and notified, called back and said pt can either wait till morning or leave AMA as no D/C plan was yet in place by admitting MD, the [facility] was also notified, same related back to pt and daughter. Pt finally agreed to stay till morning but asked her daughter to stay with her, both were however reassured. Ob[place]-Asidi, Willine Schwalbe Efe

## 2016-10-11 NOTE — Care Management Note (Signed)
Case Management Note  Patient Details  Name: Lori [name] MRN: [id number] Date of Birth: 1918/08/04  Subjective/Objective:                    Action/Plan: Pt discharged to [facility] SNF. No further needs per CM.   Expected Discharge Date:  10/11/16               Expected Discharge Plan:  Skilled Nursing Facility  In-House Referral:  Clinical Social Work  Discharge planning Services     Post Acute Care Choice:    Choice offered to:     DME Arranged:    DME Agency:     HH Arranged:    [facility] Agency:     Status of Service:  Completed, signed off  If discussed at [employer], dates discussed:    Additional Comments:  Pollie Friar, RN 10/11/2016, 3:55 PM

## 2016-10-11 NOTE — Progress Notes (Signed)
Pt called that she was uncomfortable in bed, c/o of back pain, had tylenol earlier at 0026 with little effect, NP K. Schorr paged, ordered tab 2 tab of Vicodin but pt agreed to take 1 tab for now, same given at 0258, will continue to monitor. Obasogie-Asidi, Lori [name]

## 2016-10-11 NOTE — Progress Notes (Addendum)
Discharge to: [facility] Anticipated discharge date: 10/11/16 Family notified: Yes, at bedside Transportation by: Family car  Report #: [phone]  [place] signing off.  Laveda Abbe LCSW [phone]

## 2016-10-11 NOTE — Discharge Instructions (Signed)
Follow with Primary MD Lucretia Kern, DO in 2-3 days   Get CBC, CMP, 2 view Chest X ray checked  by Primary MD or SNF MD in 2-3 days ( we routinely change or add medications that can affect your baseline labs and fluid status, therefore we recommend that you get the mentioned basic workup next visit with your PCP, your PCP may decide not to get them or add new tests based on their clinical decision)  Activity: As tolerated with Full fall precautions use walker/cane & assistance as needed  Disposition TBD, patient to decide between home health PT and SNF. Both are arranged.  Diet:   Heart Healthy    For Heart failure patients - Check your Weight same time everyday, if you gain over 2 pounds, or you develop in leg swelling, experience more shortness of breath or chest pain, call your Primary MD immediately. Follow Cardiac Low Salt Diet and 1.5 lit/day fluid restriction.  On your next visit with your primary care physician please Get Medicines reviewed and adjusted.  Please request your Prim.MD to go over all Hospital Tests and Procedure/Radiological results at the follow up, please get all Hospital records sent to your Prim MD by signing hospital release before you go home.  If you experience worsening of your admission symptoms, develop shortness of breath, life threatening emergency, suicidal or homicidal thoughts you must seek medical attention immediately by calling 911 or calling your MD immediately  if symptoms less severe.  You Must read complete instructions/literature along with all the possible adverse reactions/side effects for all the Medicines you take and that have been prescribed to you. Take any new Medicines after you have completely understood and accpet all the possible adverse reactions/side effects.   Do not drive, operate heavy machinery, perform activities at heights, swimming or participation in water activities or provide baby sitting services if your were admitted for  syncope or siezures until you have seen by Primary MD or a Neurologist and advised to do so again.  Do not drive when taking Pain medications.    Do not take more than prescribed Pain, Sleep and Anxiety Medications  Special Instructions: If you have smoked or chewed Tobacco  in the last 2 yrs please stop smoking, stop any regular Alcohol  and or any Recreational drug use.  Wear Seat belts while driving.   Please note  You were cared for by a hospitalist during your hospital stay. If you have any questions about your discharge medications or the care you received while you were in the hospital after you are discharged, you can call the unit and asked to speak with the hospitalist on call if the hospitalist that took care of you is not available. Once you are discharged, your primary care physician will handle any further medical issues. Please note that NO REFILLS for any discharge medications will be authorized once you are discharged, as it is imperative that you return to your primary care physician (or establish a relationship with a primary care physician if you do not have one) for your aftercare needs so that they can reassess your need for medications and monitor your lab values.

## 2016-10-12 DIAGNOSIS — S32039D Unspecified fracture of third lumbar vertebra, subsequent encounter for fracture with routine healing: Secondary | ICD-10-CM | POA: Diagnosis not present

## 2016-10-12 DIAGNOSIS — Z9181 History of falling: Secondary | ICD-10-CM | POA: Diagnosis not present

## 2016-10-12 DIAGNOSIS — S32029D Unspecified fracture of second lumbar vertebra, subsequent encounter for fracture with routine healing: Secondary | ICD-10-CM | POA: Diagnosis not present

## 2016-10-12 DIAGNOSIS — M6281 Muscle weakness (generalized): Secondary | ICD-10-CM | POA: Diagnosis not present

## 2016-10-12 DIAGNOSIS — R262 Difficulty in walking, not elsewhere classified: Secondary | ICD-10-CM | POA: Diagnosis not present

## 2016-10-12 DIAGNOSIS — S32019D Unspecified fracture of first lumbar vertebra, subsequent encounter for fracture with routine healing: Secondary | ICD-10-CM | POA: Diagnosis not present

## 2016-10-15 ENCOUNTER — Telehealth: Payer: Self-pay

## 2016-10-15 DIAGNOSIS — S32019D Unspecified fracture of first lumbar vertebra, subsequent encounter for fracture with routine healing: Secondary | ICD-10-CM | POA: Diagnosis not present

## 2016-10-15 DIAGNOSIS — S32039D Unspecified fracture of third lumbar vertebra, subsequent encounter for fracture with routine healing: Secondary | ICD-10-CM | POA: Diagnosis not present

## 2016-10-15 DIAGNOSIS — M6281 Muscle weakness (generalized): Secondary | ICD-10-CM | POA: Diagnosis not present

## 2016-10-15 DIAGNOSIS — R262 Difficulty in walking, not elsewhere classified: Secondary | ICD-10-CM | POA: Diagnosis not present

## 2016-10-15 DIAGNOSIS — Z9181 History of falling: Secondary | ICD-10-CM | POA: Diagnosis not present

## 2016-10-15 DIAGNOSIS — S32029D Unspecified fracture of second lumbar vertebra, subsequent encounter for fracture with routine healing: Secondary | ICD-10-CM | POA: Diagnosis not present

## 2016-10-15 NOTE — Telephone Encounter (Signed)
LMTCB for pt's daughter Needing to know if pt admitted to SNF, if not needs hospital follow up

## 2016-10-16 DIAGNOSIS — M6281 Muscle weakness (generalized): Secondary | ICD-10-CM | POA: Diagnosis not present

## 2016-10-16 DIAGNOSIS — S32039D Unspecified fracture of third lumbar vertebra, subsequent encounter for fracture with routine healing: Secondary | ICD-10-CM | POA: Diagnosis not present

## 2016-10-16 DIAGNOSIS — S32029D Unspecified fracture of second lumbar vertebra, subsequent encounter for fracture with routine healing: Secondary | ICD-10-CM | POA: Diagnosis not present

## 2016-10-16 DIAGNOSIS — S32019D Unspecified fracture of first lumbar vertebra, subsequent encounter for fracture with routine healing: Secondary | ICD-10-CM | POA: Diagnosis not present

## 2016-10-16 DIAGNOSIS — Z9181 History of falling: Secondary | ICD-10-CM | POA: Diagnosis not present

## 2016-10-16 DIAGNOSIS — R262 Difficulty in walking, not elsewhere classified: Secondary | ICD-10-CM | POA: Diagnosis not present

## 2016-10-16 NOTE — Telephone Encounter (Signed)
Spoke with pt's daughter, Hoyle Sauer. She states that pt has been admitted to rehab at [facility] for approximately 10 days. She is receiving OT and PT daily. Per daughter she is doing well. Advised her to contact office once pt has been d/c'd from rehab so that we can schedule hospital follow up. She agrees and will call office once she knows d/c date. Nothing further needed at this time.   Dr. [name] - [place]

## 2016-10-17 DIAGNOSIS — R262 Difficulty in walking, not elsewhere classified: Secondary | ICD-10-CM | POA: Diagnosis not present

## 2016-10-17 DIAGNOSIS — S32019D Unspecified fracture of first lumbar vertebra, subsequent encounter for fracture with routine healing: Secondary | ICD-10-CM | POA: Diagnosis not present

## 2016-10-17 DIAGNOSIS — Z9181 History of falling: Secondary | ICD-10-CM | POA: Diagnosis not present

## 2016-10-17 DIAGNOSIS — M6281 Muscle weakness (generalized): Secondary | ICD-10-CM | POA: Diagnosis not present

## 2016-10-17 DIAGNOSIS — S32029D Unspecified fracture of second lumbar vertebra, subsequent encounter for fracture with routine healing: Secondary | ICD-10-CM | POA: Diagnosis not present

## 2016-10-17 DIAGNOSIS — S32039D Unspecified fracture of third lumbar vertebra, subsequent encounter for fracture with routine healing: Secondary | ICD-10-CM | POA: Diagnosis not present

## 2016-10-18 DIAGNOSIS — Z9181 History of falling: Secondary | ICD-10-CM | POA: Diagnosis not present

## 2016-10-18 DIAGNOSIS — S32039D Unspecified fracture of third lumbar vertebra, subsequent encounter for fracture with routine healing: Secondary | ICD-10-CM | POA: Diagnosis not present

## 2016-10-18 DIAGNOSIS — R262 Difficulty in walking, not elsewhere classified: Secondary | ICD-10-CM | POA: Diagnosis not present

## 2016-10-18 DIAGNOSIS — S32019D Unspecified fracture of first lumbar vertebra, subsequent encounter for fracture with routine healing: Secondary | ICD-10-CM | POA: Diagnosis not present

## 2016-10-18 DIAGNOSIS — S32029D Unspecified fracture of second lumbar vertebra, subsequent encounter for fracture with routine healing: Secondary | ICD-10-CM | POA: Diagnosis not present

## 2016-10-18 DIAGNOSIS — M6281 Muscle weakness (generalized): Secondary | ICD-10-CM | POA: Diagnosis not present

## 2016-10-19 DIAGNOSIS — Z9181 History of falling: Secondary | ICD-10-CM | POA: Diagnosis not present

## 2016-10-19 DIAGNOSIS — S32019D Unspecified fracture of first lumbar vertebra, subsequent encounter for fracture with routine healing: Secondary | ICD-10-CM | POA: Diagnosis not present

## 2016-10-19 DIAGNOSIS — M6281 Muscle weakness (generalized): Secondary | ICD-10-CM | POA: Diagnosis not present

## 2016-10-19 DIAGNOSIS — S32039D Unspecified fracture of third lumbar vertebra, subsequent encounter for fracture with routine healing: Secondary | ICD-10-CM | POA: Diagnosis not present

## 2016-10-19 DIAGNOSIS — R262 Difficulty in walking, not elsewhere classified: Secondary | ICD-10-CM | POA: Diagnosis not present

## 2016-10-19 DIAGNOSIS — S32029D Unspecified fracture of second lumbar vertebra, subsequent encounter for fracture with routine healing: Secondary | ICD-10-CM | POA: Diagnosis not present

## 2016-10-20 DIAGNOSIS — R1310 Dysphagia, unspecified: Secondary | ICD-10-CM | POA: Diagnosis not present

## 2016-10-22 DIAGNOSIS — R262 Difficulty in walking, not elsewhere classified: Secondary | ICD-10-CM | POA: Diagnosis not present

## 2016-10-22 DIAGNOSIS — S32019D Unspecified fracture of first lumbar vertebra, subsequent encounter for fracture with routine healing: Secondary | ICD-10-CM | POA: Diagnosis not present

## 2016-10-22 DIAGNOSIS — S32029D Unspecified fracture of second lumbar vertebra, subsequent encounter for fracture with routine healing: Secondary | ICD-10-CM | POA: Diagnosis not present

## 2016-10-22 DIAGNOSIS — S32039D Unspecified fracture of third lumbar vertebra, subsequent encounter for fracture with routine healing: Secondary | ICD-10-CM | POA: Diagnosis not present

## 2016-10-22 DIAGNOSIS — Z9181 History of falling: Secondary | ICD-10-CM | POA: Diagnosis not present

## 2016-10-22 DIAGNOSIS — M6281 Muscle weakness (generalized): Secondary | ICD-10-CM | POA: Diagnosis not present

## 2016-10-23 DIAGNOSIS — I10 Essential (primary) hypertension: Secondary | ICD-10-CM | POA: Diagnosis not present

## 2016-10-23 DIAGNOSIS — S32039D Unspecified fracture of third lumbar vertebra, subsequent encounter for fracture with routine healing: Secondary | ICD-10-CM | POA: Diagnosis not present

## 2016-10-23 DIAGNOSIS — M6281 Muscle weakness (generalized): Secondary | ICD-10-CM | POA: Diagnosis not present

## 2016-10-23 DIAGNOSIS — S32029D Unspecified fracture of second lumbar vertebra, subsequent encounter for fracture with routine healing: Secondary | ICD-10-CM | POA: Diagnosis not present

## 2016-10-23 DIAGNOSIS — S32038D Other fracture of third lumbar vertebra, subsequent encounter for fracture with routine healing: Secondary | ICD-10-CM | POA: Diagnosis not present

## 2016-10-23 DIAGNOSIS — M545 Low back pain: Secondary | ICD-10-CM | POA: Diagnosis not present

## 2016-10-23 DIAGNOSIS — Z9181 History of falling: Secondary | ICD-10-CM | POA: Diagnosis not present

## 2016-10-23 DIAGNOSIS — S32019D Unspecified fracture of first lumbar vertebra, subsequent encounter for fracture with routine healing: Secondary | ICD-10-CM | POA: Diagnosis not present

## 2016-10-23 DIAGNOSIS — R262 Difficulty in walking, not elsewhere classified: Secondary | ICD-10-CM | POA: Diagnosis not present

## 2016-10-24 DIAGNOSIS — R262 Difficulty in walking, not elsewhere classified: Secondary | ICD-10-CM | POA: Diagnosis not present

## 2016-10-24 DIAGNOSIS — M6281 Muscle weakness (generalized): Secondary | ICD-10-CM | POA: Diagnosis not present

## 2016-10-24 DIAGNOSIS — S32019D Unspecified fracture of first lumbar vertebra, subsequent encounter for fracture with routine healing: Secondary | ICD-10-CM | POA: Diagnosis not present

## 2016-10-24 DIAGNOSIS — S32029D Unspecified fracture of second lumbar vertebra, subsequent encounter for fracture with routine healing: Secondary | ICD-10-CM | POA: Diagnosis not present

## 2016-10-24 DIAGNOSIS — Z9181 History of falling: Secondary | ICD-10-CM | POA: Diagnosis not present

## 2016-10-24 DIAGNOSIS — S32039D Unspecified fracture of third lumbar vertebra, subsequent encounter for fracture with routine healing: Secondary | ICD-10-CM | POA: Diagnosis not present

## 2016-10-26 DIAGNOSIS — I10 Essential (primary) hypertension: Secondary | ICD-10-CM | POA: Diagnosis not present

## 2016-10-26 DIAGNOSIS — H3530 Unspecified macular degeneration: Secondary | ICD-10-CM | POA: Diagnosis not present

## 2016-10-26 DIAGNOSIS — S32029D Unspecified fracture of second lumbar vertebra, subsequent encounter for fracture with routine healing: Secondary | ICD-10-CM | POA: Diagnosis not present

## 2016-10-26 DIAGNOSIS — S32039D Unspecified fracture of third lumbar vertebra, subsequent encounter for fracture with routine healing: Secondary | ICD-10-CM | POA: Diagnosis not present

## 2016-10-26 DIAGNOSIS — S2231XD Fracture of one rib, right side, subsequent encounter for fracture with routine healing: Secondary | ICD-10-CM | POA: Diagnosis not present

## 2016-10-26 DIAGNOSIS — S32019D Unspecified fracture of first lumbar vertebra, subsequent encounter for fracture with routine healing: Secondary | ICD-10-CM | POA: Diagnosis not present

## 2016-10-29 ENCOUNTER — Telehealth: Payer: Self-pay | Admitting: Family Medicine

## 2016-10-29 DIAGNOSIS — H3530 Unspecified macular degeneration: Secondary | ICD-10-CM | POA: Diagnosis not present

## 2016-10-29 DIAGNOSIS — S32029D Unspecified fracture of second lumbar vertebra, subsequent encounter for fracture with routine healing: Secondary | ICD-10-CM | POA: Diagnosis not present

## 2016-10-29 DIAGNOSIS — S32019D Unspecified fracture of first lumbar vertebra, subsequent encounter for fracture with routine healing: Secondary | ICD-10-CM | POA: Diagnosis not present

## 2016-10-29 DIAGNOSIS — S2231XD Fracture of one rib, right side, subsequent encounter for fracture with routine healing: Secondary | ICD-10-CM | POA: Diagnosis not present

## 2016-10-29 DIAGNOSIS — S32039D Unspecified fracture of third lumbar vertebra, subsequent encounter for fracture with routine healing: Secondary | ICD-10-CM | POA: Diagnosis not present

## 2016-10-29 DIAGNOSIS — I10 Essential (primary) hypertension: Secondary | ICD-10-CM | POA: Diagnosis not present

## 2016-10-29 NOTE — Telephone Encounter (Signed)
Spoke to [place] and informed her that Dr. [name] has not seen the pt for this dx and not able to order at this time.  Also informed her that the pt has a post rehab follow up with Dr. [name] on 11/02/16 and may be able to order at that time if physician feels it is necessary/needed.  Could call specialist.

## 2016-10-29 NOTE — Telephone Encounter (Signed)
° °  Lori [name] with [facility] call to request verbal PT orders for 1 time a week for 1 week and 2 times a week for 8 weeks starting 10/26/16   [phone]

## 2016-10-29 NOTE — Telephone Encounter (Signed)
We have not seen her for this dx. Is she seeing specialist whom could order? O/w needs 30 min hospital f/u appt here or insurance may not pay if we order. Thanks.

## 2016-11-02 ENCOUNTER — Ambulatory Visit (INDEPENDENT_AMBULATORY_CARE_PROVIDER_SITE_OTHER): Payer: Medicare Other | Admitting: Family Medicine

## 2016-11-02 ENCOUNTER — Encounter: Payer: Self-pay | Admitting: Family Medicine

## 2016-11-02 VITALS — BP 138/78 | HR 72 | Temp 97.8°F | Wt 106.8 lb

## 2016-11-02 DIAGNOSIS — Z8781 Personal history of (healed) traumatic fracture: Secondary | ICD-10-CM

## 2016-11-02 DIAGNOSIS — R5381 Other malaise: Secondary | ICD-10-CM

## 2016-11-02 DIAGNOSIS — W19XXXA Unspecified fall, initial encounter: Secondary | ICD-10-CM | POA: Diagnosis not present

## 2016-11-02 NOTE — Patient Instructions (Signed)
BEFORE YOU LEAVE: -follow up:  1-2 months -please order PT eval and treat   Can decrease pepcid to once daily for 1 week, then stop  Use the tylenol 500mg , mobic 7.5mg  or lidocaine patches only as needed up to twice daily  Use your walker for ambulation  Please let us know if you have any concerns in the interim prior to your next appointment

## 2016-11-02 NOTE — Addendum Note (Signed)
Addended by: Wyvonne Lenz on: 11/02/2016 02:23 PM   Modules accepted: Orders

## 2016-11-02 NOTE — Progress Notes (Signed)
HPI:  Hospital follow up: -hospitalized 5/29-5/31/18 for rib rx and L1-L3 TVP fxs on CT s/p mechanical fall -in rehab until - receive intensive PT and OT - out about 1.5 weeks -BMP/CBC in hospital 5/29 and 30 ok -CXR in hospital without cardiopulmonary disease per report -reports: doing quite well, daughter lives with her - still som pains in lower R back with certain movements, primarily turning in bed and also still with a bit of fear of falling and deconditioning -is able to bath, eat, dress and do normal chores -meds are scheduled: mobic bid 7.5, tylenol 500 qid, lidocaine patches bid and pepcid 20mg  bid from rehab - she is not sure that she needs them as when she missed them did ok -feels she would benefit from some further PT fro gait and stability - afraid to leave home due to concern for falls on steps/etc -BP, thyroid conditions stable -no head injury or LOC    ROS: See pertinent positives and negatives per HPI.  Past Medical History:  Diagnosis Date  . Colon cancer ([place]) 2009   s/p colon resection  . Endometrial cancer ([place]) 1989   endometrial s/p hysterectomy, chemo and radiation  . Hearing loss   . History of blood transfusion   . Hypertension   . Macular degeneration   . Migraines   . Pneumonia   . Positive TB test   . Stroke [facility]) 2004   ? TIA, brief episode of speech issues - evaluate OH    Past Surgical History:  Procedure Laterality Date  . ABDOMINAL HYSTERECTOMY  1989  . APPENDECTOMY    . COLON RESECTION  08/27/2007  . GALLBLADDER SURGERY  1970  . SUBDURAL HEMATOMA EVACUATION VIA CRANIOTOMY  2002  . TONSILLECTOMY AND ADENOIDECTOMY  1943    Family History  Problem Relation Age of Onset  . Arthritis Mother   . Heart disease Father   . Hyperlipidemia Father   . Hypertension Father   . Diabetes Sister   . Crohn's disease Daughter     Social History   Social History  . Marital status: Widowed    Spouse name: N/A  . Number of children: 2  .  Years of education: N/A   Occupational History  . retired    Social History Main Topics  . Smoking status: Never Smoker  . Smokeless tobacco: Never Used  . Alcohol use Yes     Comment: occ glass of wine   . Drug use: No  . Sexual activity: Not Asked   Other Topics Concern  . None   Social History Narrative   Work or School: retired      Insurance risk surveyor Situation: lives with her daughter (HCPOA: Chrisma Hurlock)      Spiritual Beliefs: Christian      Lifestyle: walks              Current Outpatient Prescriptions:  .  acetaminophen (TYLENOL) 325 MG tablet, Take 2 tablets (650 mg total) by mouth every 6 (six) hours as needed for mild pain (or Fever >/= 101). (Patient taking differently: Take 500 mg by mouth 4 (four) times daily. ), Disp: 20 tablet, Rfl: 0 .  Cholecalciferol (VITAMIN D PO), Take 1 capsule by mouth daily. , Disp: , Rfl:  .  docusate sodium (COLACE) 100 MG capsule, Take 2 capsules (200 mg total) by mouth 2 (two) times daily as needed for mild constipation., Disp: 20 capsule, Rfl: 0 .  famotidine (PEPCID) 20 MG tablet, Take 20 mg  by mouth 2 (two) times daily., Disp: , Rfl:  .  levothyroxine (SYNTHROID, LEVOTHROID) 50 MCG tablet, TAKE 2 TABLETS ON MONDAY AND FRIDAY. TAKE 1 TABLET ALL OTHER DAYS, Disp: 144 tablet, Rfl: 1 .  lisinopril (PRINIVIL,ZESTRIL) 20 MG tablet, TAKE 1 TABLET (20 MG TOTAL) BY MOUTH DAILY., Disp: 90 tablet, Rfl: 2 .  meloxicam (MOBIC) 7.5 MG tablet, Take 7.5 mg by mouth 2 (two) times daily., Disp: , Rfl:  .  Multiple Vitamins-Minerals (EYE VITAMINS PO), Take 1 tablet by mouth 2 (two) times daily. , Disp: , Rfl:  .  Riboflavin (VITAMIN B-2 PO), Take 1 tablet by mouth daily. , Disp: , Rfl:  .  vitamin C (ASCORBIC ACID) 500 MG tablet, Take 500 mg by mouth daily., Disp: , Rfl:   EXAM:  Vitals:   11/02/16 1318  BP: 138/78  Pulse: 72  Temp: 97.8 F (36.6 C)    Body mass index is 20.86 kg/m.  GENERAL: vitals reviewed and listed above, alert,  oriented, appears well hydrated and in no acute distress  HEENT: atraumatic, conjunttiva clear, no obvious abnormalities on inspection of external nose and ears  NECK: no obvious masses on inspection  LUNGS: clear to auscultation bilaterally, no wheezes, rales or rhonchi, good air movement  CV: HRRR, no peripheral edema  MS: moves all extremities without noticeable abnormality, walks well with walker, slow and cautious without walker, transfers and rising without much difficulty, some TTP low back and lower rib cage  PSYCH: pleasant and cooperative, no obvious depression or anxiety  ASSESSMENT AND PLAN:  Discussed the following assessment and plan:  Fall, initial encounter  History of vertebral fracture  Hx of fracture of rib  Physical deconditioning  -she seems to be recovering remarkably well -do still feel would benefit from some PT for strength, reassurance, gait -she declined OT/HH otherwise as does not feel she needs this -decrease medications as tolerated -fall precuations -follow up 1-2 months, sooner as needed -Patient advised to return or notify a doctor immediately if symptoms worsen or persist or new concerns arise.  Patient Instructions  BEFORE YOU LEAVE: -follow up:  1-2 months -please order PT eval and treat   Can decrease pepcid to once daily for 1 week, then stop  Use the tylenol 500mg , mobic 7.5mg  or lidocaine patches only as needed up to twice daily  Use your walker for ambulation  Please let us know if you have any concerns in the interim prior to your next appointment       Colin Benton R., DO

## 2016-11-05 ENCOUNTER — Telehealth: Payer: Self-pay | Admitting: Family Medicine

## 2016-11-05 NOTE — Telephone Encounter (Signed)
PT need verbal orders for home health PT for 1 for 1 week and 2 for 7 weeks starting today.

## 2016-11-05 NOTE — Telephone Encounter (Signed)
[name] would like verbal order for OT for once a wk for one week then twice a wk for 4 wks

## 2016-11-05 NOTE — Telephone Encounter (Signed)
See prior note-Lori [name] was informed of the message below and stated she will let [name] know this also.

## 2016-11-05 NOTE — Telephone Encounter (Signed)
She declined home health and OT but did need PT. Ok to give home PT order. Thanks.

## 2016-11-05 NOTE — Telephone Encounter (Signed)
She did not feel needed and declined OT.

## 2016-11-05 NOTE — Telephone Encounter (Signed)
I called Verdis Frederickson and informed her of the message below and a verbal order was given for PT.

## 2016-11-06 DIAGNOSIS — S32039D Unspecified fracture of third lumbar vertebra, subsequent encounter for fracture with routine healing: Secondary | ICD-10-CM | POA: Diagnosis not present

## 2016-11-06 DIAGNOSIS — I10 Essential (primary) hypertension: Secondary | ICD-10-CM | POA: Diagnosis not present

## 2016-11-06 DIAGNOSIS — S32029D Unspecified fracture of second lumbar vertebra, subsequent encounter for fracture with routine healing: Secondary | ICD-10-CM | POA: Diagnosis not present

## 2016-11-06 DIAGNOSIS — S2231XD Fracture of one rib, right side, subsequent encounter for fracture with routine healing: Secondary | ICD-10-CM | POA: Diagnosis not present

## 2016-11-06 DIAGNOSIS — S32019D Unspecified fracture of first lumbar vertebra, subsequent encounter for fracture with routine healing: Secondary | ICD-10-CM | POA: Diagnosis not present

## 2016-11-06 DIAGNOSIS — H3530 Unspecified macular degeneration: Secondary | ICD-10-CM | POA: Diagnosis not present

## 2016-11-07 ENCOUNTER — Telehealth: Payer: Self-pay | Admitting: Family Medicine

## 2016-11-07 NOTE — Telephone Encounter (Signed)
° ° °  Daughter call to request on the below med,said pt rash has flared up    [place] at [employer]

## 2016-11-08 MED ORDER — TRIAMCINOLONE ACETONIDE 0.1 % EX CREA: 1.0000 "application " | TOPICAL_CREAM | Freq: Two times a day (BID) | CUTANEOUS | 0 refills | Status: DC

## 2016-11-08 NOTE — Telephone Encounter (Signed)
Rx last prescribed by Dr [name] 10/26/13.  A refill was sent to [facility].

## 2016-11-08 NOTE — Telephone Encounter (Signed)
Ok to refill if rxd in the past. Thanks.

## 2016-11-09 DIAGNOSIS — H3530 Unspecified macular degeneration: Secondary | ICD-10-CM | POA: Diagnosis not present

## 2016-11-09 DIAGNOSIS — S32029D Unspecified fracture of second lumbar vertebra, subsequent encounter for fracture with routine healing: Secondary | ICD-10-CM | POA: Diagnosis not present

## 2016-11-09 DIAGNOSIS — S32019D Unspecified fracture of first lumbar vertebra, subsequent encounter for fracture with routine healing: Secondary | ICD-10-CM | POA: Diagnosis not present

## 2016-11-09 DIAGNOSIS — S32039D Unspecified fracture of third lumbar vertebra, subsequent encounter for fracture with routine healing: Secondary | ICD-10-CM | POA: Diagnosis not present

## 2016-11-09 DIAGNOSIS — S2231XD Fracture of one rib, right side, subsequent encounter for fracture with routine healing: Secondary | ICD-10-CM | POA: Diagnosis not present

## 2016-11-09 DIAGNOSIS — I10 Essential (primary) hypertension: Secondary | ICD-10-CM | POA: Diagnosis not present

## 2016-11-13 DIAGNOSIS — H3530 Unspecified macular degeneration: Secondary | ICD-10-CM | POA: Diagnosis not present

## 2016-11-13 DIAGNOSIS — S32029D Unspecified fracture of second lumbar vertebra, subsequent encounter for fracture with routine healing: Secondary | ICD-10-CM | POA: Diagnosis not present

## 2016-11-13 DIAGNOSIS — S2231XD Fracture of one rib, right side, subsequent encounter for fracture with routine healing: Secondary | ICD-10-CM | POA: Diagnosis not present

## 2016-11-13 DIAGNOSIS — S32039D Unspecified fracture of third lumbar vertebra, subsequent encounter for fracture with routine healing: Secondary | ICD-10-CM | POA: Diagnosis not present

## 2016-11-13 DIAGNOSIS — I10 Essential (primary) hypertension: Secondary | ICD-10-CM | POA: Diagnosis not present

## 2016-11-13 DIAGNOSIS — S32019D Unspecified fracture of first lumbar vertebra, subsequent encounter for fracture with routine healing: Secondary | ICD-10-CM | POA: Diagnosis not present

## 2016-11-16 DIAGNOSIS — S32039D Unspecified fracture of third lumbar vertebra, subsequent encounter for fracture with routine healing: Secondary | ICD-10-CM | POA: Diagnosis not present

## 2016-11-16 DIAGNOSIS — S32019D Unspecified fracture of first lumbar vertebra, subsequent encounter for fracture with routine healing: Secondary | ICD-10-CM | POA: Diagnosis not present

## 2016-11-16 DIAGNOSIS — I10 Essential (primary) hypertension: Secondary | ICD-10-CM | POA: Diagnosis not present

## 2016-11-16 DIAGNOSIS — H3530 Unspecified macular degeneration: Secondary | ICD-10-CM | POA: Diagnosis not present

## 2016-11-16 DIAGNOSIS — S2231XD Fracture of one rib, right side, subsequent encounter for fracture with routine healing: Secondary | ICD-10-CM | POA: Diagnosis not present

## 2016-11-16 DIAGNOSIS — S32029D Unspecified fracture of second lumbar vertebra, subsequent encounter for fracture with routine healing: Secondary | ICD-10-CM | POA: Diagnosis not present

## 2016-11-19 DIAGNOSIS — H3530 Unspecified macular degeneration: Secondary | ICD-10-CM | POA: Diagnosis not present

## 2016-11-19 DIAGNOSIS — S32039D Unspecified fracture of third lumbar vertebra, subsequent encounter for fracture with routine healing: Secondary | ICD-10-CM | POA: Diagnosis not present

## 2016-11-19 DIAGNOSIS — S32019D Unspecified fracture of first lumbar vertebra, subsequent encounter for fracture with routine healing: Secondary | ICD-10-CM | POA: Diagnosis not present

## 2016-11-19 DIAGNOSIS — S2231XD Fracture of one rib, right side, subsequent encounter for fracture with routine healing: Secondary | ICD-10-CM | POA: Diagnosis not present

## 2016-11-19 DIAGNOSIS — I10 Essential (primary) hypertension: Secondary | ICD-10-CM | POA: Diagnosis not present

## 2016-11-19 DIAGNOSIS — S32029D Unspecified fracture of second lumbar vertebra, subsequent encounter for fracture with routine healing: Secondary | ICD-10-CM | POA: Diagnosis not present

## 2016-11-22 DIAGNOSIS — S32019D Unspecified fracture of first lumbar vertebra, subsequent encounter for fracture with routine healing: Secondary | ICD-10-CM | POA: Diagnosis not present

## 2016-11-22 DIAGNOSIS — S32039D Unspecified fracture of third lumbar vertebra, subsequent encounter for fracture with routine healing: Secondary | ICD-10-CM | POA: Diagnosis not present

## 2016-11-22 DIAGNOSIS — H3530 Unspecified macular degeneration: Secondary | ICD-10-CM | POA: Diagnosis not present

## 2016-11-22 DIAGNOSIS — I10 Essential (primary) hypertension: Secondary | ICD-10-CM | POA: Diagnosis not present

## 2016-11-22 DIAGNOSIS — S32029D Unspecified fracture of second lumbar vertebra, subsequent encounter for fracture with routine healing: Secondary | ICD-10-CM | POA: Diagnosis not present

## 2016-11-22 DIAGNOSIS — S2231XD Fracture of one rib, right side, subsequent encounter for fracture with routine healing: Secondary | ICD-10-CM | POA: Diagnosis not present

## 2016-11-24 ENCOUNTER — Other Ambulatory Visit: Payer: Self-pay | Admitting: Family Medicine

## 2016-11-26 DIAGNOSIS — I10 Essential (primary) hypertension: Secondary | ICD-10-CM | POA: Diagnosis not present

## 2016-11-26 DIAGNOSIS — S32039D Unspecified fracture of third lumbar vertebra, subsequent encounter for fracture with routine healing: Secondary | ICD-10-CM | POA: Diagnosis not present

## 2016-11-26 DIAGNOSIS — S2231XD Fracture of one rib, right side, subsequent encounter for fracture with routine healing: Secondary | ICD-10-CM | POA: Diagnosis not present

## 2016-11-26 DIAGNOSIS — S32019D Unspecified fracture of first lumbar vertebra, subsequent encounter for fracture with routine healing: Secondary | ICD-10-CM | POA: Diagnosis not present

## 2016-11-26 DIAGNOSIS — S32029D Unspecified fracture of second lumbar vertebra, subsequent encounter for fracture with routine healing: Secondary | ICD-10-CM | POA: Diagnosis not present

## 2016-11-26 DIAGNOSIS — H3530 Unspecified macular degeneration: Secondary | ICD-10-CM | POA: Diagnosis not present

## 2016-11-27 ENCOUNTER — Encounter: Payer: Self-pay | Admitting: Family Medicine

## 2016-11-27 ENCOUNTER — Ambulatory Visit (INDEPENDENT_AMBULATORY_CARE_PROVIDER_SITE_OTHER): Payer: Medicare Other | Admitting: Family Medicine

## 2016-11-27 VITALS — BP 120/50 | HR 90 | Temp 97.9°F | Ht 60.0 in | Wt 102.7 lb

## 2016-11-27 DIAGNOSIS — M546 Pain in thoracic spine: Secondary | ICD-10-CM

## 2016-11-27 NOTE — Progress Notes (Signed)
HPI:  Acute visit for back pain: -R lower to mid thoracic - perhaps triggered by exercises with PT -started 2 days ago, sharp with certain movements -did ice and aleve this morning and now resolved -no radiation, malaise, fevers, known trauma  ROS: See pertinent positives and negatives per HPI.  Past Medical History:  Diagnosis Date  . Colon cancer ([place]) 2009   s/p colon resection  . Endometrial cancer ([place]) 1989   endometrial s/p hysterectomy, chemo and radiation  . Hearing loss   . History of blood transfusion   . Hypertension   . Macular degeneration   . Migraines   . Pneumonia   . Positive TB test   . Stroke [facility]) 2004   ? TIA, brief episode of speech issues - evaluate OH    Past Surgical History:  Procedure Laterality Date  . ABDOMINAL HYSTERECTOMY  1989  . APPENDECTOMY    . COLON RESECTION  08/27/2007  . GALLBLADDER SURGERY  1970  . SUBDURAL HEMATOMA EVACUATION VIA CRANIOTOMY  2002  . TONSILLECTOMY AND ADENOIDECTOMY  1943    Family History  Problem Relation Age of Onset  . Arthritis Mother   . Heart disease Father   . Hyperlipidemia Father   . Hypertension Father   . Diabetes Sister   . Crohn's disease Daughter     Social History   Social History  . Marital status: Widowed    Spouse name: N/A  . Number of children: 2  . Years of education: N/A   Occupational History  . retired    Social History Main Topics  . Smoking status: Never Smoker  . Smokeless tobacco: Never Used  . Alcohol use Yes     Comment: occ glass of wine   . Drug use: No  . Sexual activity: Not Asked   Other Topics Concern  . 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  acetaminophen (TYLENOL) 500 MG tablet, Take 500 mg by mouth as needed., Disp: , Rfl:  .  Cholecalciferol  (VITAMIN D PO), Take 1 capsule by mouth daily. , Disp: , Rfl:  .  levothyroxine (SYNTHROID, LEVOTHROID) 50 MCG tablet, TAKE 2 TABLETS ON MONDAY AND FRIDAY. TAKE 1 TABLET ALL OTHER DAYS, Disp: 144 tablet, Rfl: 1 .  lisinopril (PRINIVIL,ZESTRIL) 20 MG tablet, TAKE 1 TABLET (20 MG TOTAL) BY MOUTH DAILY., Disp: 90 tablet, Rfl: 2 .  Multiple Vitamins-Minerals (EYE VITAMINS PO), Take 1 tablet by mouth 2 (two) times daily. , Disp: , Rfl:  .  Riboflavin (VITAMIN B-2 PO), Take 1 tablet by mouth daily. , Disp: , Rfl:  .  triamcinolone cream (KENALOG) 0.1 %, Apply 1 application topically 2 (two) times daily., Disp: 30 g, Rfl: 0 .  vitamin C (ASCORBIC ACID) 500 MG tablet, Take 500 mg by mouth daily., Disp: , Rfl:   EXAM:  Vitals:   11/27/16 1533  BP: (!) 120/50  Pulse: 90  Temp: 97.9 F (36.6 C)    Body mass index is 20.06 kg/m.  GENERAL: vitals reviewed and listed above, alert, oriented, appears well hydrated and in no acute distress  HEENT: atraumatic, conjunttiva clear, no obvious abnormalities on inspection of external nose and ears  NECK:  no obvious masses on inspection  LUNGS: clear to auscultation bilaterally, no wheezes, rales or rhonchi, good air movement  CV: HRRR, no peripheral edema  MS: moves all extremities without noticeable abnormality, TTP mid to lower R thoracic paraspinal muscles, no bony TTP  PSYCH: pleasant and cooperative, no obvious depression or anxiety  ASSESSMENT AND PLAN:  Discussed the following assessment and plan:  Acute right-sided thoracic back pain  -we discussed possible serious and likely etiologies, workup and treatment, treatment risks and return precautions -after this discussion, Jeanee opted for conservative tx per instructions -follow up advised 3 weeks -of course, we advised [place]  to return or notify a doctor immediately if symptoms worsen or persist or new concerns arise.    Patient Instructions  BEFORE YOU LEAVE: -follow up: cancel  currently scheduled follow up and reschedule for 3 weeks from now  Ice or heat and tiger balm as needed for pain.  Tylenol or aleve if needed only per instructions.  Gentle stretching and activity.  Follow up sooner if worsening or new concerns.    Colin Benton R., DO

## 2016-11-27 NOTE — Patient Instructions (Signed)
BEFORE YOU LEAVE: -follow up: cancel currently scheduled follow up and reschedule for 3 weeks from now  Ice or heat and tiger balm as needed for pain.  Tylenol or aleve if needed only per instructions.  Gentle stretching and activity.  Follow up sooner if worsening or new concerns.

## 2016-11-28 DIAGNOSIS — S32029D Unspecified fracture of second lumbar vertebra, subsequent encounter for fracture with routine healing: Secondary | ICD-10-CM | POA: Diagnosis not present

## 2016-11-28 DIAGNOSIS — S2231XD Fracture of one rib, right side, subsequent encounter for fracture with routine healing: Secondary | ICD-10-CM | POA: Diagnosis not present

## 2016-11-28 DIAGNOSIS — H3530 Unspecified macular degeneration: Secondary | ICD-10-CM | POA: Diagnosis not present

## 2016-11-28 DIAGNOSIS — S32019D Unspecified fracture of first lumbar vertebra, subsequent encounter for fracture with routine healing: Secondary | ICD-10-CM | POA: Diagnosis not present

## 2016-11-28 DIAGNOSIS — S32039D Unspecified fracture of third lumbar vertebra, subsequent encounter for fracture with routine healing: Secondary | ICD-10-CM | POA: Diagnosis not present

## 2016-11-28 DIAGNOSIS — I10 Essential (primary) hypertension: Secondary | ICD-10-CM | POA: Diagnosis not present

## 2016-12-03 ENCOUNTER — Ambulatory Visit: Payer: Medicare Other | Admitting: Family Medicine

## 2016-12-04 ENCOUNTER — Ambulatory Visit (INDEPENDENT_AMBULATORY_CARE_PROVIDER_SITE_OTHER): Payer: Medicare Other | Admitting: Family Medicine

## 2016-12-04 ENCOUNTER — Telehealth: Payer: Self-pay

## 2016-12-04 ENCOUNTER — Encounter: Payer: Self-pay | Admitting: Family Medicine

## 2016-12-04 VITALS — BP 122/80 | HR 95 | Temp 98.0°F | Ht 60.0 in | Wt 102.6 lb

## 2016-12-04 DIAGNOSIS — R0982 Postnasal drip: Secondary | ICD-10-CM

## 2016-12-04 DIAGNOSIS — R05 Cough: Secondary | ICD-10-CM | POA: Diagnosis not present

## 2016-12-04 DIAGNOSIS — R059 Cough, unspecified: Secondary | ICD-10-CM

## 2016-12-04 DIAGNOSIS — R194 Change in bowel habit: Secondary | ICD-10-CM

## 2016-12-04 DIAGNOSIS — M546 Pain in thoracic spine: Secondary | ICD-10-CM | POA: Diagnosis not present

## 2016-12-04 MED ORDER — BENZONATATE 100 MG PO CAPS: 100.0000 mg | ORAL_CAPSULE | Freq: Three times a day (TID) | ORAL | 0 refills | Status: DC | PRN

## 2016-12-04 NOTE — Telephone Encounter (Signed)
Pt's daughter called to report that pt has not been feeling well for several days. She has had several bouts of diarrhea, some explosive. She has had continued chest/back pain that is the same as her LOV 11/27/16. She also has a slight productive cough. Daughter states that pt denies any urinary symptoms or fever but does c/o some chills. She also states that pt has not been sleeping well at all. She is up all night and taking naps in the mornings. Pt has told daughter that she "feels yucky" and she does seem somewhat depressed. Daughter is fine to bring pt in for OV if necessary.   Dr. [name] - Please advise. Thanks!

## 2016-12-04 NOTE — Patient Instructions (Addendum)
BEFORE YOU LEAVE: -can cancel August appointment if you wish -follow up: AWV with [name] and follow up with Dr. [name] - same day in October  For the aches and pains at night: -tylenol at night for the next week, then as needed -heat and topical menthol can also help as needed  For the chronic drainage in the throat/cough: -flonase 2 sprays each nostril for 1 month --> then 1 spray each nostril daily -can try the tessalon for cough as needed -if flonase not helping after 1 month, then can try over the counter acid reducer for 2 weeks  Bowl abnormalities and irregularities. See handout below for acid reflux dietary considerations. Call your gastroenterologist if your would like follow up there.   Food Choices for Gastroesophageal Reflux Disease, Adult When you have gastroesophageal reflux disease (GERD), the foods you eat and your eating habits are very important. Choosing the right foods can help ease your discomfort. What guidelines do I need to follow?  Choose fruits, vegetables, whole grains, and low-fat dairy products.  Choose low-fat meat, fish, and poultry.  Limit fats such as oils, salad dressings, butter, nuts, and avocado.  Keep a food diary. This helps you identify foods that cause symptoms.  Avoid foods that cause symptoms. These may be different for everyone.  Eat small meals often instead of 3 large meals a day.  Eat your meals slowly, in a place where you are relaxed.  Limit fried foods.  Cook foods using methods other than frying.  Avoid drinking alcohol.  Avoid drinking large amounts of liquids with your meals.  Avoid bending over or lying down until 2-3 hours after eating. What foods are not recommended? These are some foods and drinks that may make your symptoms worse: Vegetables Tomatoes. Tomato juice. Tomato and spaghetti sauce. Chili peppers. Onion and garlic. Horseradish. Fruits Oranges, grapefruit, and lemon (fruit and juice). Meats High-fat meats,  fish, and poultry. This includes hot dogs, ribs, ham, sausage, salami, and bacon. Dairy Whole milk and chocolate milk. Sour cream. Cream. Butter. Ice cream. Cream cheese. Drinks Coffee and tea. Bubbly (carbonated) drinks or energy drinks. Condiments Hot sauce. Barbecue sauce. Sweets/Desserts Chocolate and cocoa. Donuts. Peppermint and spearmint. Fats and Oils High-fat foods. This includes [place] fries and potato chips. Other Vinegar. Strong spices. This includes black pepper, white pepper, red pepper, cayenne, curry powder, cloves, ginger, and chili powder. The items listed above may not be a complete list of foods and drinks to avoid. Contact your dietitian for more information. This information is not intended to replace advice given to you by your health care provider. Make sure you discuss any questions you have with your health care provider. Document Released: 10/30/2011 Document Revised: 10/06/2015 Document Reviewed: 03/04/2013 Elsevier Interactive Patient Education  2017 [employer].

## 2016-12-04 NOTE — Progress Notes (Signed)
HPI:  Acute visit for a number of issues:  Change in bowel habits: -chronic, had mild constipation a few days ago then softer stool once (not diarrhea), now normal the last few days -has hx chronic fluctuating bowel habits and anxiety about this, has seen several GI doctors about this - including Dr. Jason [name] most recently -remote hx colon ca s/p resection -denies fevers, NV, diarrhea, hematochezia, melena, unexplained wt loss  Chronic PND: -for a long time, maybe since move to [place] -notices phlegm in the throat in the morning -cough at times -PND, clear rhinorrhea -sometime has burping  Back pain: -had L thoracic back pain last night, tossed and turned and had poor sleep -has various aches and pains, mainly in back that more days then not can result in poor sleep -did not take anything for the pain -better today  ROS: See pertinent positives and negatives per HPI.  Past Medical History:  Diagnosis Date  . Colon cancer ([place]) 2009   s/p colon resection  . Endometrial cancer ([place]) 1989   endometrial s/p hysterectomy, chemo and radiation  . Hearing loss   . History of blood transfusion   . Hypertension   . Macular degeneration   . Migraines   . Pneumonia   . Positive TB test   . Stroke [facility]) 2004   ? TIA, brief episode of speech issues - evaluate OH    Past Surgical History:  Procedure Laterality Date  . ABDOMINAL HYSTERECTOMY  1989  . APPENDECTOMY    . COLON RESECTION  08/27/2007  . GALLBLADDER SURGERY  1970  . SUBDURAL HEMATOMA EVACUATION VIA CRANIOTOMY  2002  . TONSILLECTOMY AND ADENOIDECTOMY  1943    Family History  Problem Relation Age of Onset  . Arthritis Mother   . Heart disease Father   . Hyperlipidemia Father   . Hypertension Father   . Diabetes Sister   . Crohn's disease Daughter     Social History   Social History  . Marital status: Widowed    Spouse name: N/A  . Number of children: 2  . Years of education: N/A   Occupational History  .  retired    Social History Main Topics  . Smoking status: Never Smoker  . Smokeless tobacco: Never Used  . Alcohol use Yes     Comment: occ glass of wine   . Drug use: No  . Sexual activity: Not Asked   Other Topics Concern  . 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  acetaminophen (TYLENOL) 500 MG tablet, Take 500 mg by mouth as needed., Disp: , Rfl:  .  Cholecalciferol (VITAMIN D PO), Take 1 capsule by mouth daily. , Disp: , Rfl:  .  levothyroxine (SYNTHROID, LEVOTHROID) 50 MCG tablet, TAKE 2 TABLETS ON MONDAY AND FRIDAY. TAKE 1 TABLET ALL OTHER DAYS, Disp: 144 tablet, Rfl: 1 .  lisinopril (PRINIVIL,ZESTRIL) 20 MG tablet, TAKE 1 TABLET (20 MG TOTAL) BY MOUTH DAILY., Disp: 90 tablet, Rfl: 2 .  Multiple Vitamins-Minerals (EYE VITAMINS PO), Take 1 tablet by mouth 2 (two) times daily. , Disp: , Rfl:  .  Riboflavin (VITAMIN B-2 PO), Take 1 tablet by mouth daily. , Disp: , Rfl:  .  triamcinolone cream (KENALOG) 0.1 %, Apply 1 application topically 2 (two) times daily., Disp: 30 g, Rfl: 0 .  vitamin C (ASCORBIC ACID) 500 MG tablet, Take 500 mg by mouth daily., Disp: , Rfl:  .  benzonatate (TESSALON PERLES) 100 MG capsule, Take 1 capsule (100 mg total) by mouth 3 (three) times daily as needed for cough., Disp: 20 capsule, Rfl: 0  EXAM:  Vitals:   12/04/16 1533  BP: 122/80  Pulse: 95  Temp: 98 F (36.7 C)    Body mass index is 20.04 kg/m.  GENERAL: vitals reviewed and listed above, alert, oriented, appears well hydrated and in no acute distress  HEENT: atraumatic, conjunttiva clear, no obvious abnormalities on inspection of external nose and ears, normal appearance of ear canals and TMs, clear nasal congestion, mild post oropharyngeal erythema with PND, no tonsillar edema or  exudate, no sinus TTP  NECK: no obvious masses on inspection  LUNGS: clear to auscultation bilaterally, no wheezes, rales or rhonchi, good air movement  CV: HRRR, no peripheral edema  MS: moves all extremities without noticeable abnormality, scoliosis stable, mild TTP L thoracic paraspinal muscles  ABD: BS+, Soft, NTTP, no rebound or guarding  PSYCH: pleasant and cooperative, no obvious depression or anxiety  ASSESSMENT AND PLAN:  Discussed the following assessment and plan: More than 50% of over 40 minutes spent in total in caring for this patient was spent face-to-face with the patient, counseling and/or coordinating care.    Change in bowel habits  -we discussed possible serious and likely etiologies, workup and treatment, treatment risks and return precautions; they seem to not want to pursue invasive diagnostic procedures (wiseted in seeing her gastroenterologist - they plan to call (Dr. Jason [name]) -follow up advised per instructions -of course, we advised Lori [name]  to return or notify a doctor immediately if symptoms worsen or persist or new concerns arise.  Acute left-sided low back pain with sciatica, sciatica laterality unspecified -tylenol at night for one week as daughter feels scheduling better then prn -then try prn -follow up as needed if persists or worsens  PND (post-nasal drip) Cough -discussed various etiologies and suspect PND from allergies or GERD most likely -trial flnoase, then OTC acid reducer -dietary changes for gerd as she is interested in this -follow up in 3 months, sooner if needed  -Patient advised to return or notify a doctor immediately if symptoms worsen or persist or new concerns arise.  Patient Instructions  BEFORE YOU LEAVE: -can cancel August appointment if you wish -follow up: AWV with Lori [name] and follow up with Lori [name] - same day in October  For the aches and pains at night: -tylenol at night for the next week, then as needed -heat and  topical menthol can also help as needed  For the chronic drainage in the throat/cough: -flonase 2 sprays each nostril for 1 month --> then 1 spray each nostril daily -can try the tessalon for cough as needed -if flonase not helping after 1 month, then can try over the counter acid reducer for 2 weeks  Bowl abnormalities and irregularities. See handout below for acid reflux dietary considerations. Call your gastroenterologist if your would like follow up there.   Food Choices for Gastroesophageal Reflux Disease, Adult When you have gastroesophageal reflux disease (GERD), the foods you eat and your eating habits are very important. Choosing the right foods can help ease your discomfort. What guidelines do I need to follow?  Choose fruits, vegetables, whole grains, and low-fat dairy products.  Choose low-fat meat, fish, and poultry.  Limit fats such as oils, salad dressings, butter, nuts, and avocado.  Keep a food diary. This helps you identify foods that cause symptoms.  Avoid foods that cause symptoms. These may be different for everyone.  Eat small meals often instead of 3 large meals a day.  Eat your meals slowly, in a place where you are relaxed.  Limit fried foods.  Cook foods using methods other than frying.  Avoid drinking alcohol.  Avoid drinking large amounts of liquids with your meals.  Avoid bending over or lying down until 2-3 hours after eating. What foods are not recommended? These are some foods and drinks that may make your symptoms worse: Vegetables Tomatoes. Tomato juice. Tomato and spaghetti sauce. Chili peppers. Onion and garlic. Horseradish. Fruits Oranges, grapefruit, and lemon (fruit and juice). Meats High-fat meats, fish, and poultry. This includes hot dogs, ribs, ham, sausage, salami, and bacon. Dairy Whole milk and chocolate milk. Sour cream. Cream. Butter. Ice cream. Cream cheese. Drinks Coffee and tea. Bubbly (carbonated) drinks or energy  drinks. Condiments Hot sauce. Barbecue sauce. Sweets/Desserts Chocolate and cocoa. Donuts. Peppermint and spearmint. Fats and Oils High-fat foods. This includes [place] fries and potato chips. Other Vinegar. Strong spices. This includes black pepper, white pepper, red pepper, cayenne, curry powder, cloves, ginger, and chili powder. The items listed above may not be a complete list of foods and drinks to avoid. Contact your dietitian for more information. This information is not intended to replace advice given to you by your health care provider. Make sure you discuss any questions you have with your health care provider. Document Released: 10/30/2011 Document Revised: 10/06/2015 Document Reviewed: 03/04/2013 Elsevier Interactive Patient Education  2017 [place]., DO

## 2016-12-04 NOTE — Telephone Encounter (Signed)
Would advise OV if they are able to come in - seems like a lot is going on. Thanks.

## 2016-12-04 NOTE — Telephone Encounter (Signed)
Spoke with pt's daughter and scheduled appt for this afternoon.

## 2016-12-07 DIAGNOSIS — S32019D Unspecified fracture of first lumbar vertebra, subsequent encounter for fracture with routine healing: Secondary | ICD-10-CM | POA: Diagnosis not present

## 2016-12-07 DIAGNOSIS — H3530 Unspecified macular degeneration: Secondary | ICD-10-CM | POA: Diagnosis not present

## 2016-12-07 DIAGNOSIS — S32039D Unspecified fracture of third lumbar vertebra, subsequent encounter for fracture with routine healing: Secondary | ICD-10-CM | POA: Diagnosis not present

## 2016-12-07 DIAGNOSIS — S2231XD Fracture of one rib, right side, subsequent encounter for fracture with routine healing: Secondary | ICD-10-CM | POA: Diagnosis not present

## 2016-12-07 DIAGNOSIS — I10 Essential (primary) hypertension: Secondary | ICD-10-CM | POA: Diagnosis not present

## 2016-12-07 DIAGNOSIS — S32029D Unspecified fracture of second lumbar vertebra, subsequent encounter for fracture with routine healing: Secondary | ICD-10-CM | POA: Diagnosis not present

## 2016-12-11 DIAGNOSIS — I10 Essential (primary) hypertension: Secondary | ICD-10-CM | POA: Diagnosis not present

## 2016-12-11 DIAGNOSIS — S32019D Unspecified fracture of first lumbar vertebra, subsequent encounter for fracture with routine healing: Secondary | ICD-10-CM | POA: Diagnosis not present

## 2016-12-11 DIAGNOSIS — S32029D Unspecified fracture of second lumbar vertebra, subsequent encounter for fracture with routine healing: Secondary | ICD-10-CM | POA: Diagnosis not present

## 2016-12-11 DIAGNOSIS — S2231XD Fracture of one rib, right side, subsequent encounter for fracture with routine healing: Secondary | ICD-10-CM | POA: Diagnosis not present

## 2016-12-11 DIAGNOSIS — H3530 Unspecified macular degeneration: Secondary | ICD-10-CM | POA: Diagnosis not present

## 2016-12-11 DIAGNOSIS — S32039D Unspecified fracture of third lumbar vertebra, subsequent encounter for fracture with routine healing: Secondary | ICD-10-CM | POA: Diagnosis not present

## 2016-12-13 ENCOUNTER — Other Ambulatory Visit: Payer: Self-pay | Admitting: Family Medicine

## 2016-12-13 DIAGNOSIS — S32039D Unspecified fracture of third lumbar vertebra, subsequent encounter for fracture with routine healing: Secondary | ICD-10-CM | POA: Diagnosis not present

## 2016-12-13 DIAGNOSIS — S32019D Unspecified fracture of first lumbar vertebra, subsequent encounter for fracture with routine healing: Secondary | ICD-10-CM | POA: Diagnosis not present

## 2016-12-13 DIAGNOSIS — I10 Essential (primary) hypertension: Secondary | ICD-10-CM | POA: Diagnosis not present

## 2016-12-13 DIAGNOSIS — H3530 Unspecified macular degeneration: Secondary | ICD-10-CM | POA: Diagnosis not present

## 2016-12-13 DIAGNOSIS — S2231XD Fracture of one rib, right side, subsequent encounter for fracture with routine healing: Secondary | ICD-10-CM | POA: Diagnosis not present

## 2016-12-13 DIAGNOSIS — S32029D Unspecified fracture of second lumbar vertebra, subsequent encounter for fracture with routine healing: Secondary | ICD-10-CM | POA: Diagnosis not present

## 2016-12-14 ENCOUNTER — Ambulatory Visit: Payer: Medicare Other | Admitting: Family Medicine

## 2016-12-17 DIAGNOSIS — S2231XD Fracture of one rib, right side, subsequent encounter for fracture with routine healing: Secondary | ICD-10-CM | POA: Diagnosis not present

## 2016-12-17 DIAGNOSIS — S32039D Unspecified fracture of third lumbar vertebra, subsequent encounter for fracture with routine healing: Secondary | ICD-10-CM | POA: Diagnosis not present

## 2016-12-17 DIAGNOSIS — S32019D Unspecified fracture of first lumbar vertebra, subsequent encounter for fracture with routine healing: Secondary | ICD-10-CM | POA: Diagnosis not present

## 2016-12-17 DIAGNOSIS — S32029D Unspecified fracture of second lumbar vertebra, subsequent encounter for fracture with routine healing: Secondary | ICD-10-CM | POA: Diagnosis not present

## 2016-12-17 DIAGNOSIS — H3530 Unspecified macular degeneration: Secondary | ICD-10-CM | POA: Diagnosis not present

## 2016-12-17 DIAGNOSIS — I10 Essential (primary) hypertension: Secondary | ICD-10-CM | POA: Diagnosis not present

## 2016-12-20 DIAGNOSIS — H3530 Unspecified macular degeneration: Secondary | ICD-10-CM | POA: Diagnosis not present

## 2016-12-20 DIAGNOSIS — I10 Essential (primary) hypertension: Secondary | ICD-10-CM | POA: Diagnosis not present

## 2016-12-20 DIAGNOSIS — S32039D Unspecified fracture of third lumbar vertebra, subsequent encounter for fracture with routine healing: Secondary | ICD-10-CM | POA: Diagnosis not present

## 2016-12-20 DIAGNOSIS — S32019D Unspecified fracture of first lumbar vertebra, subsequent encounter for fracture with routine healing: Secondary | ICD-10-CM | POA: Diagnosis not present

## 2016-12-20 DIAGNOSIS — S2231XD Fracture of one rib, right side, subsequent encounter for fracture with routine healing: Secondary | ICD-10-CM | POA: Diagnosis not present

## 2016-12-20 DIAGNOSIS — S32029D Unspecified fracture of second lumbar vertebra, subsequent encounter for fracture with routine healing: Secondary | ICD-10-CM | POA: Diagnosis not present

## 2016-12-26 ENCOUNTER — Telehealth: Payer: Self-pay | Admitting: Family Medicine

## 2016-12-26 NOTE — Telephone Encounter (Signed)
FYI

## 2016-12-26 NOTE — Telephone Encounter (Signed)
Patient Name: SANGEETA YOUSE  DOB: [birth year]        Nurse Assessment  Nurse: Joline Salt, RN, Malachy Mood Date/Time Eilene Ghazi Time): 12/26/2016 12:28:08 PM  Confirm and document reason for call. If symptomatic, describe symptoms. ---Caller states her mother cramps and has nausea, bloody/mucus in stool, urgency to go but can't go. History of colon cancer. Recent fall this summer with fracture. Has not left the home for 3 weeks.  Does the patient have any new or worsening symptoms? ---Yes  Will a triage be completed? ---Yes  Related visit to physician within the last 2 weeks? ---No  Does the PT have any chronic conditions? (i.e. diabetes, asthma, etc.) ---Yes  List chronic conditions. ---Endometrial CA in 1989 and Colon CA in 2009  Is this a behavioral health or substance abuse call? ---No     Guidelines    Guideline Title Affirmed Question Affirmed Notes  Rectal Bleeding MODERATE rectal bleeding (small blood clots, passing blood without stool, or toilet water turns red)    Final Disposition User   See Physician within [place], RN, [employer] decided they will go to the GI MD with whom patient is an established patient   Referrals  GO TO [place] UNDECIDED   Disagree/Comply: Comply

## 2016-12-27 ENCOUNTER — Other Ambulatory Visit: Payer: Self-pay | Admitting: Gastroenterology

## 2016-12-27 DIAGNOSIS — R198 Other specified symptoms and signs involving the digestive system and abdomen: Secondary | ICD-10-CM | POA: Diagnosis not present

## 2016-12-27 DIAGNOSIS — K5901 Slow transit constipation: Secondary | ICD-10-CM | POA: Diagnosis not present

## 2016-12-27 DIAGNOSIS — R194 Change in bowel habit: Secondary | ICD-10-CM

## 2016-12-27 DIAGNOSIS — R634 Abnormal weight loss: Secondary | ICD-10-CM

## 2017-01-01 ENCOUNTER — Ambulatory Visit
Admission: RE | Admit: 2017-01-01 | Discharge: 2017-01-01 | Disposition: A | Discharge status: Home / Self Care | Payer: Medicare Other | Source: Ambulatory Visit | Attending: Gastroenterology | Admitting: Gastroenterology

## 2017-01-01 DIAGNOSIS — R194 Change in bowel habit: Secondary | ICD-10-CM

## 2017-01-01 DIAGNOSIS — K5900 Constipation, unspecified: Secondary | ICD-10-CM | POA: Diagnosis not present

## 2017-01-01 DIAGNOSIS — R634 Abnormal weight loss: Secondary | ICD-10-CM

## 2017-01-01 MED ORDER — IOPAMIDOL (ISOVUE-300) INJECTION 61%
100.0000 mL | Freq: Once | INTRAVENOUS | Status: AC | PRN
  Administered 2017-01-01: 100 mL via INTRAVENOUS

## 2017-01-04 ENCOUNTER — Ambulatory Visit (INDEPENDENT_AMBULATORY_CARE_PROVIDER_SITE_OTHER): Payer: Medicare Other | Admitting: Family Medicine

## 2017-01-04 ENCOUNTER — Telehealth: Payer: Self-pay | Admitting: Family Medicine

## 2017-01-04 ENCOUNTER — Encounter: Payer: Self-pay | Admitting: Family Medicine

## 2017-01-04 VITALS — BP 132/82 | HR 107 | Temp 98.3°F | Ht 60.0 in | Wt 96.8 lb

## 2017-01-04 DIAGNOSIS — M25432 Effusion, left wrist: Secondary | ICD-10-CM | POA: Diagnosis not present

## 2017-01-04 DIAGNOSIS — R634 Abnormal weight loss: Secondary | ICD-10-CM

## 2017-01-04 NOTE — Telephone Encounter (Signed)
Okay. 30 minutes would be a good idea.

## 2017-01-04 NOTE — Telephone Encounter (Signed)
° ° ° ° °  Pt daughter said she needed to speak with Dr [name] about FMLA paperwork. Does Dr [name] want her to have a 30 minute appt

## 2017-01-04 NOTE — Patient Instructions (Signed)
BEFORE YOU LEAVE: -follow up: as needed and in 1 month  Elevated hand/wrist. Ice several times per day. Can take aleve or ibuprofen 1-2 times daily. See your orthopedic doctor if persists or worsens.  3 healthy meals per day and boost clear supplement drink if smaller meal.

## 2017-01-04 NOTE — Progress Notes (Signed)
HPI:  Acute visit for several issues. First of all she has swelling of the left wrist: -Started about 4-5 days ago -She had acute swelling and mild pain of the left ulnarwrist without much, redness or pruritus -She does hold her cane in his hand, but can't think of changes in activity, injury or trauma -No weakness, fevers, malaise, redness or numbness  Weight loss: -gradually over the last year and a half, but more significantly since her fall and compression fractures -She was on a number of medications at the time and did not have a good appetite -Poor appetite has persisted -She lost about 20 pounds in the last year -She is a saw her gastroenterologist for this recently and had a CT scan -Reports CT scan was fairly benign except for some mild ischemic colitis which she was told to treat with bowel regimen - she has follow-up with her gastroenterologist about this -Daughter reports she eats poorly, small amounts - patient feels she eats pretty well  ROS: See pertinent positives and negatives per HPI.  Past Medical History:  Diagnosis Date  . Colon cancer ([place]) 2009   s/p colon resection  . Endometrial cancer ([place]) 1989   endometrial s/p hysterectomy, chemo and radiation  . Hearing loss   . History of blood transfusion   . Hypertension   . Macular degeneration   . Migraines   . Pneumonia   . Positive TB test   . Stroke [facility]) 2004   ? TIA, brief episode of speech issues - evaluate OH    Past Surgical History:  Procedure Laterality Date  . ABDOMINAL HYSTERECTOMY  1989  . APPENDECTOMY    . COLON RESECTION  08/27/2007  . GALLBLADDER SURGERY  1970  . SUBDURAL HEMATOMA EVACUATION VIA CRANIOTOMY  2002  . TONSILLECTOMY AND ADENOIDECTOMY  1943    Family History  Problem Relation Age of Onset  . Arthritis Mother   . Heart disease Father   . Hyperlipidemia Father   . Hypertension Father   . Diabetes Sister   . Crohn's disease Daughter     Social History   Social  History  . Marital status: Widowed    Spouse name: N/A  . Number of children: 2  . Years of education: N/A   Occupational History  . retired    Social History Main Topics  . Smoking status: Never Smoker  . Smokeless tobacco: Never Used  . Alcohol use Yes     Comment: occ glass of wine   . Drug use: No  . Sexual activity: Not Asked   Other Topics Concern  . 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  acetaminophen (TYLENOL) 500 MG tablet, Take 500 mg by mouth as needed., Disp: , Rfl:  .  Cholecalciferol (VITAMIN D PO), Take 1 capsule by mouth daily. , Disp: , Rfl:  .  levothyroxine (SYNTHROID, LEVOTHROID) 50 MCG tablet, TAKE 2 TABLETS ON MONDAY AND FRIDAY. TAKE 1 TABLET ALL OTHER DAYS, Disp: 144 tablet, Rfl: 1 .  lisinopril (PRINIVIL,ZESTRIL) 20 MG tablet, TAKE 1 TABLET (20 MG TOTAL) BY MOUTH DAILY., Disp: 90 tablet, Rfl: 1 .  Multiple Vitamins-Minerals (EYE VITAMINS PO), Take 1 tablet by mouth  2 (two) times daily. , Disp: , Rfl:  .  Riboflavin (VITAMIN B-2 PO), Take 1 tablet by mouth daily. , Disp: , Rfl:  .  triamcinolone cream (KENALOG) 0.1 %, Apply 1 application topically 2 (two) times daily., Disp: 30 g, Rfl: 0 .  vitamin C (ASCORBIC ACID) 500 MG tablet, Take 500 mg by mouth daily., Disp: , Rfl:   EXAM:  Vitals:   01/04/17 1433  BP: 132/82  Pulse: (!) 107  Temp: 98.3 F (36.8 C)    Body mass index is 18.9 kg/m.  GENERAL: vitals reviewed and listed above, alert, oriented, appears well hydrated and in no acute distress  HEENT: atraumatic, conjunttiva clear, no obvious abnormalities on inspection of external nose and ears  NECK: no obvious masses on inspection  LUNGS: clear to auscultation bilaterally, no wheezes, rales or rhonchi, good air movement  CV: HRRR, no  peripheral edema  MS: moves all extremities without noticeable abnormality  PSYCH: pleasant and cooperative, no obvious depression or anxiety  ASSESSMENT AND PLAN:  Discussed the following assessment and plan: More than 50% of over 30 minutes spent in total in caring for this patient was spent face-to-face with the patient, counseling and/or coordinating care.   Wrist swelling, left  Weight loss  -we discussed possible serious and likely etiologies for her various complaints, workup and treatment, treatment risks and return precautions  -query tendinitis of the wrist versus other, offered x-ray versus referral to sports med, and they have an orthopedic specialist and they opted they may go there to the walk-in clinic or call in next week after a trial conservative measures over the weekend -advised 3 healthy meals a day and boost clear further weight loss and poor appetite along with follow-up with gastroenterologist -Daughter is considering a family medical leave of absence to care for her mother, feels pt needs help with preparing meals and caring for herself, she agrees to set up an appointment to discuss this and bring the paperwork -of course, we advised Lilliauna  to return or notify a doctor immediately if symptoms worsen or persist or new concerns arise.   Patient Instructions  BEFORE YOU LEAVE: -follow up: as needed and in 1 month  Elevated hand/wrist. Ice several times per day. Can take aleve or ibuprofen 1-2 times daily. See your orthopedic doctor if persists or worsens.  3 healthy meals per day and boost clear supplement drink if smaller meal.       Colin Benton R., DO

## 2017-01-10 ENCOUNTER — Encounter: Payer: Self-pay | Admitting: Family Medicine

## 2017-01-10 ENCOUNTER — Telehealth: Payer: Self-pay | Admitting: *Deleted

## 2017-01-10 ENCOUNTER — Ambulatory Visit (INDEPENDENT_AMBULATORY_CARE_PROVIDER_SITE_OTHER): Payer: Medicare Other | Admitting: Family Medicine

## 2017-01-10 VITALS — BP 122/78 | HR 95 | Temp 97.7°F | Ht 60.0 in | Wt 95.4 lb

## 2017-01-10 DIAGNOSIS — H3530 Unspecified macular degeneration: Secondary | ICD-10-CM | POA: Diagnosis not present

## 2017-01-10 DIAGNOSIS — K559 Vascular disorder of intestine, unspecified: Secondary | ICD-10-CM

## 2017-01-10 DIAGNOSIS — I10 Essential (primary) hypertension: Secondary | ICD-10-CM | POA: Diagnosis not present

## 2017-01-10 DIAGNOSIS — E039 Hypothyroidism, unspecified: Secondary | ICD-10-CM

## 2017-01-10 DIAGNOSIS — M5440 Lumbago with sciatica, unspecified side: Secondary | ICD-10-CM

## 2017-01-10 DIAGNOSIS — R634 Abnormal weight loss: Secondary | ICD-10-CM | POA: Diagnosis not present

## 2017-01-10 NOTE — Telephone Encounter (Signed)
Dr [name] completed FMLA forms for the pts daughter and this was left at the front desk. I left a detailed message with this information at Catherine's cell number.

## 2017-01-10 NOTE — Progress Notes (Signed)
HPI:  Acute visit for completion of FMLA for her daughter. Patient has a past medical history of macular degeneration, hypertension, hypothyroidism,hearing loss, several fractures after a fall earlier this year, deconditioning, weight loss recently and ischemic colitis( seeing her gastroenterologist.)  Daughter has noticed that she has required more and more care since her hospitalization in May. Daughter has been doing all shopping, driving, major meal prep, housekeeping, managing appointments and medical care, providing transportation and monitoring medications. She also provides patient with comfort, companionship and reassurance. She has begun to manage the patient's financial affairs. She also assists with personal care when needed. She is in the process of finding appropriate assisted living for the patient, but during this time feels that she needs to be home with the patient until the transition is made. She is requesting short-term FMLA to care for the patient during the fall semester. She had so this will give her time to set up assisted living for the patient. Patient has a new complaint of several painful ulcers on her tongue. She saw her dentist about this and has follow-up next week to recheck. Her wrist is much better and almost all the swelling has resolved.daughter has not started her on the meal supplement yet, but reports the patient's appetite has improved the last few days. She has not had any further falls. She is able to get up and down from chair to make transfers safely. No fevers, rashes, vomiting, nausea or hallucinations. She has a follow-up in a few weeks to get labs and for regular follow-up. ROS: See pertinent positives and negatives per HPI.  Past Medical History:  Diagnosis Date  . Colon cancer ([place]) 2009   s/p colon resection  . Endometrial cancer ([place]) 1989   endometrial s/p hysterectomy, chemo and radiation  . Hearing loss   . History of blood transfusion   .  Hypertension   . Macular degeneration   . Migraines   . Pneumonia   . Positive TB test   . Stroke [facility]) 2004   ? TIA, brief episode of speech issues - evaluate OH    Past Surgical History:  Procedure Laterality Date  . ABDOMINAL HYSTERECTOMY  1989  . APPENDECTOMY    . COLON RESECTION  08/27/2007  . GALLBLADDER SURGERY  1970  . SUBDURAL HEMATOMA EVACUATION VIA CRANIOTOMY  2002  . TONSILLECTOMY AND ADENOIDECTOMY  1943    Family History  Problem Relation Age of Onset  . Arthritis Mother   . Heart disease Father   . Hyperlipidemia Father   . Hypertension Father   . Diabetes Sister   . Crohn's disease Daughter     Social History   Social History  . Marital status: Widowed    Spouse name: N/A  . Number of children: 2  . Years of education: N/A   Occupational History  . retired    Social History Main Topics  . Smoking status: Never Smoker  . Smokeless tobacco: Never Used  . Alcohol use Yes     Comment: occ glass of wine   . Drug use: No  . Sexual activity: Not Asked   Other Topics Concern  . 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  acetaminophen (TYLENOL) 500 MG tablet, Take 500 mg by mouth as needed., Disp: , Rfl:  .  Cholecalciferol (VITAMIN D PO), Take 1 capsule by mouth daily. , Disp: , Rfl:  .  levothyroxine (SYNTHROID, LEVOTHROID) 50 MCG tablet, TAKE 2 TABLETS ON MONDAY AND FRIDAY. TAKE 1 TABLET ALL OTHER DAYS, Disp: 144 tablet, Rfl: 1 .  lisinopril (PRINIVIL,ZESTRIL) 20 MG tablet, TAKE 1 TABLET (20 MG TOTAL) BY MOUTH DAILY., Disp: 90 tablet, Rfl: 1 .  Multiple Vitamins-Minerals (EYE VITAMINS PO), Take 1 tablet by mouth 2 (two) times daily. , Disp: , Rfl:  .  Riboflavin (VITAMIN B-2 PO), Take 1 tablet by mouth daily. , Disp: , Rfl:  .  triamcinolone cream  (KENALOG) 0.1 %, Apply 1 application topically 2 (two) times daily., Disp: 30 g, Rfl: 0 .  vitamin C (ASCORBIC ACID) 500 MG tablet, Take 500 mg by mouth daily., Disp: , Rfl:   EXAM:  Vitals:   01/10/17 1351  BP: 122/78  Pulse: 95  Temp: 97.7 F (36.5 C)    Body mass index is 18.63 kg/m.  GENERAL: vitals reviewed and listed above, alert, oriented, appears well hydrated and in no acute distress  HEENT: atraumatic, conjunttiva clear, no obvious abnormalities on inspection of external nose and ears, several small ulcers on the tongue  NECK: no obvious masses on inspection  LUNGS: clear to auscultation bilaterally, no wheezes, rales or rhonchi, good air movement  CV: HRRR, no peripheral edema  MS: moves all extremities without noticeable abnormality  PSYCH: pleasant and cooperative, no obvious depression or anxiety  ASSESSMENT AND PLAN:  Discussed the following assessment and plan:  Essential hypertension, benign  Hypothyroidism, unspecified type  Loss of weight  Acute right-sided low back pain with sciatica, sciatica laterality unspecified  Ischemic colitis (HCC)  Macular degeneration, unspecified laterality, unspecified type  -We'll complete the forms per patient request -Advised healthy diet and boost breeze or ensure clear as a supplement when diet is poor -Labs at follow-up visit -Hydrogen peroxide swish with half and half water for a few days and follow-up with dentist if also sent him persist -Patient advised to return or notify a doctor immediately if symptoms worsen or persist or new concerns arise.  There are no Patient Instructions on file for this visit.  Colin Benton R., DO

## 2017-01-17 DIAGNOSIS — R198 Other specified symptoms and signs involving the digestive system and abdomen: Secondary | ICD-10-CM | POA: Diagnosis not present

## 2017-01-17 DIAGNOSIS — R634 Abnormal weight loss: Secondary | ICD-10-CM | POA: Diagnosis not present

## 2017-01-17 DIAGNOSIS — R933 Abnormal findings on diagnostic imaging of other parts of digestive tract: Secondary | ICD-10-CM | POA: Diagnosis not present

## 2017-01-31 NOTE — Progress Notes (Signed)
HPI:  PMH hypothyroidism, bout of ischemic colitis last month - seeing her gastroenterologist for that, HTN, recent wt loss. She did not like the nutritional supplements, so continue CPAP poorly and is not adding an additional supplement. She has continued to feel poorly. She actually has felt worse and worse over the last 1 month. She feels tired all the time. Her daughter also has noted that she tends to get out of breath with even minimal movements. Daughter feels this has worsened over the last month and now she even is short of breath at rest at times. This type of breathing is intermittent. Denies fevers, chills, headache, abdominal pain, chest pain, hemoptysis, palpitations,altered mental status.trace ankle edema at times. Here for wt recheck, flu shot and labs.   ROS: See pertinent positives and negatives per HPI.  Past Medical History:  Diagnosis Date  . Colon cancer ([place]) 2009   s/p colon resection  . Endometrial cancer ([place]) 1989   endometrial s/p hysterectomy, chemo and radiation  . Hearing loss   . History of blood transfusion   . Hypertension   . Macular degeneration   . Migraines   . Pneumonia   . Positive TB test   . Stroke [facility]) 2004   ? TIA, brief episode of speech issues - evaluate OH    Past Surgical History:  Procedure Laterality Date  . ABDOMINAL HYSTERECTOMY  1989  . APPENDECTOMY    . COLON RESECTION  08/27/2007  . GALLBLADDER SURGERY  1970  . SUBDURAL HEMATOMA EVACUATION VIA CRANIOTOMY  2002  . TONSILLECTOMY AND ADENOIDECTOMY  1943    Family History  Problem Relation Age of Onset  . Arthritis Mother   . Heart disease Father   . Hyperlipidemia Father   . Hypertension Father   . Diabetes Sister   . Crohn's disease Daughter     Social History   Social History  . Marital status: Widowed    Spouse name: N/A  . Number of children: 2  . Years of education: N/A   Occupational History  . retired    Social History Main Topics  . Smoking status:  Never Smoker  . Smokeless tobacco: Never Used  . Alcohol use Yes     Comment: occ glass of wine   . Drug use: No  . Sexual activity: Not Asked   Other Topics Concern  . 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  acetaminophen (TYLENOL) 500 MG tablet, Take 500 mg by mouth as needed., Disp: , Rfl:  .  Cholecalciferol (VITAMIN D PO), Take 1 capsule by mouth daily. , Disp: , Rfl:  .  levothyroxine (SYNTHROID, LEVOTHROID) 50 MCG tablet, TAKE 2 TABLETS ON MONDAY AND FRIDAY. TAKE 1 TABLET ALL OTHER DAYS, Disp: 144 tablet, Rfl: 1 .  lisinopril (PRINIVIL,ZESTRIL) 20 MG tablet, TAKE 1 TABLET (20 MG TOTAL) BY MOUTH DAILY., Disp: 90 tablet, Rfl: 1 .  Multiple Vitamins-Minerals (EYE VITAMINS PO), Take 1 tablet by mouth 2 (two) times daily. , Disp: , Rfl:  .  Riboflavin (VITAMIN B-2 PO), Take 1 tablet by mouth daily. , Disp: , Rfl:  .  triamcinolone cream (KENALOG) 0.1 %, Apply 1 application topically 2 (two) times  daily., Disp: 30 g, Rfl: 0 .  vitamin C (ASCORBIC ACID) 500 MG tablet, Take 500 mg by mouth daily., Disp: , Rfl:   EXAM:  Vitals:   02/01/17 1115  BP: 98/60  Pulse: 82  Temp: 98.2 F (36.8 C)  SpO2: 98%    Body mass index is 18.4 kg/m.  GENERAL: vitals reviewed and listed above, alert, oriented, appears well hydrated and in no acute distress  HEENT: atraumatic, conjunttiva clear, no obvious abnormalities on inspection of external nose and ears  NECK: no obvious masses on inspection  LUNGS: clear to auscultation bilaterally, no wheezes, rales or rhonchi, good air movement  CV: HRRR, no peripheral edema  MS: moves all extremities without noticeable abnormality  PSYCH: pleasant and cooperative, no obvious depression or anxiety  ASSESSMENT AND PLAN:  Discussed the  following assessment and plan:  Dyspnea, unspecified type - Plan: DG Chest 2 View, D-dimer, Quantitative, EKG 12-Lead, Amb Referral to Palliative Care  Weight loss - Plan: Amb Referral to Palliative Care  Essential hypertension, benign - Plan: Basic metabolic panel, CBC  Hypothyroidism, unspecified type - Plan: TSH  FTT (failure to thrive) in adult - Plan: Amb Referral to Palliative Care  Need for vaccination - Plan: Flu vaccine HIGH DOSE PF (Fluzone High dose)  -discussed possible serious and likely etiologies for her symptoms, possible workup and treatment, treatment risks and return precautions with patient and daughter -EKG (sinus rhythm), CXR, labs for dyspnea, also discussed doing echo -had lengthy discussion about he declining status. Possible failure to thrive and options and will get palliative care consult to help them with decision about type of care available, EOL planning, etc -Patient advised to return or notify a doctor immediately if symptoms worsen or persist or new concerns arise.  Patient Instructions  BEFORE YOU LEAVE: -EKG -xray sheet -flu shot -labs  We have ordered labs or studies at this visit. It can take up to 1-2 weeks for results and processing. IF results require follow up or explanation, we will call you with instructions. Clinically stable results will be released to your [facility]. If you have not heard from [place] or cannot find your results in [facility] in 2 weeks please contact our office at [phone].  If you are not yet signed up for [facility], please consider signing up.           Colin Benton R., DO

## 2017-02-01 ENCOUNTER — Ambulatory Visit (HOSPITAL_COMMUNITY)
Admission: RE | Admit: 2017-02-01 | Discharge: 2017-02-01 | Disposition: A | Discharge status: Home / Self Care | Payer: Medicare Other | Source: Ambulatory Visit | Attending: Family Medicine | Admitting: Family Medicine

## 2017-02-01 ENCOUNTER — Encounter: Payer: Self-pay | Admitting: Family Medicine

## 2017-02-01 ENCOUNTER — Ambulatory Visit (INDEPENDENT_AMBULATORY_CARE_PROVIDER_SITE_OTHER): Payer: Medicare Other | Admitting: Family Medicine

## 2017-02-01 VITALS — BP 98/60 | HR 82 | Temp 98.2°F | Wt 94.2 lb

## 2017-02-01 DIAGNOSIS — E039 Hypothyroidism, unspecified: Secondary | ICD-10-CM | POA: Diagnosis not present

## 2017-02-01 DIAGNOSIS — R0602 Shortness of breath: Secondary | ICD-10-CM | POA: Diagnosis not present

## 2017-02-01 DIAGNOSIS — R9431 Abnormal electrocardiogram [ECG] [EKG]: Secondary | ICD-10-CM | POA: Diagnosis not present

## 2017-02-01 DIAGNOSIS — K559 Vascular disorder of intestine, unspecified: Secondary | ICD-10-CM | POA: Diagnosis not present

## 2017-02-01 DIAGNOSIS — R7989 Other specified abnormal findings of blood chemistry: Secondary | ICD-10-CM | POA: Diagnosis not present

## 2017-02-01 DIAGNOSIS — R0600 Dyspnea, unspecified: Secondary | ICD-10-CM

## 2017-02-01 DIAGNOSIS — R627 Adult failure to thrive: Secondary | ICD-10-CM | POA: Diagnosis not present

## 2017-02-01 DIAGNOSIS — Z23 Encounter for immunization: Secondary | ICD-10-CM | POA: Diagnosis not present

## 2017-02-01 DIAGNOSIS — I5043 Acute on chronic combined systolic (congestive) and diastolic (congestive) heart failure: Secondary | ICD-10-CM | POA: Diagnosis not present

## 2017-02-01 DIAGNOSIS — R634 Abnormal weight loss: Secondary | ICD-10-CM

## 2017-02-01 DIAGNOSIS — I10 Essential (primary) hypertension: Secondary | ICD-10-CM | POA: Diagnosis not present

## 2017-02-01 DIAGNOSIS — J189 Pneumonia, unspecified organism: Secondary | ICD-10-CM | POA: Diagnosis not present

## 2017-02-01 DIAGNOSIS — S2220XA Unspecified fracture of sternum, initial encounter for closed fracture: Secondary | ICD-10-CM | POA: Diagnosis not present

## 2017-02-01 DIAGNOSIS — I110 Hypertensive heart disease with heart failure: Secondary | ICD-10-CM | POA: Diagnosis not present

## 2017-02-01 DIAGNOSIS — M8008XA Age-related osteoporosis with current pathological fracture, vertebra(e), initial encounter for fracture: Secondary | ICD-10-CM | POA: Diagnosis not present

## 2017-02-01 LAB — CBC
HCT: 34.6 % — ABNORMAL LOW (ref 36.0–46.0)
Hemoglobin: 11.4 g/dL — ABNORMAL LOW (ref 12.0–15.0)
MCHC: 33.1 g/dL (ref 30.0–36.0)
MCV: 94.2 fl (ref 78.0–100.0)
PLATELETS: 410 10*3/uL — AB (ref 150.0–400.0)
RBC: 3.67 Mil/uL — AB (ref 3.87–5.11)
RDW: 14 % (ref 11.5–15.5)
WBC: 7.5 10*3/uL (ref 4.0–10.5)

## 2017-02-01 LAB — BASIC METABOLIC PANEL
BUN: 11 mg/dL (ref 6–23)
CALCIUM: 8.5 mg/dL (ref 8.4–10.5)
CHLORIDE: 98 meq/L (ref 96–112)
CO2: 31 meq/L (ref 19–32)
Creatinine, Ser: 0.53 mg/dL (ref 0.40–1.20)
GFR: 113.53 mL/min (ref >60.00)
Glucose, Bld: 108 mg/dL — ABNORMAL HIGH (ref 70–99)
Potassium: 3.5 mEq/L (ref 3.5–5.1)
SODIUM: 137 meq/L (ref 135–145)

## 2017-02-01 LAB — EKG 12-LEAD: EKG 12 LEAD: 0

## 2017-02-01 LAB — TSH: TSH: 3.69 u[IU]/mL (ref 0.35–4.50)

## 2017-02-01 NOTE — Patient Instructions (Signed)
BEFORE YOU LEAVE: -EKG -xray sheet -flu shot -labs  We have ordered labs or studies at this visit. It can take up to 1-2 weeks for results and processing. IF results require follow up or explanation, we will call you with instructions. Clinically stable results will be released to your [facility]. If you have not heard from [place] or cannot find your results in [facility] in 2 weeks please contact our office at [phone].  If you are not yet signed up for [facility], please consider signing up.

## 2017-02-02 LAB — D-DIMER, QUANTITATIVE (NOT AT ARMC): D DIMER QUANT: 3.49 ug{FEU}/mL — AB (ref <0.50)

## 2017-02-03 ENCOUNTER — Emergency Department (HOSPITAL_COMMUNITY): Payer: Medicare Other

## 2017-02-03 ENCOUNTER — Inpatient Hospital Stay (HOSPITAL_COMMUNITY)
Admission: EM | Admit: 2017-02-03 | Discharge: 2017-02-05 | DRG: 291 | Disposition: A | Discharge status: Home Health | Payer: Medicare Other | Attending: Family Medicine | Admitting: Family Medicine

## 2017-02-03 ENCOUNTER — Encounter (HOSPITAL_COMMUNITY): Payer: Self-pay | Admitting: Emergency Medicine

## 2017-02-03 DIAGNOSIS — Z8673 Personal history of transient ischemic attack (TIA), and cerebral infarction without residual deficits: Secondary | ICD-10-CM | POA: Diagnosis not present

## 2017-02-03 DIAGNOSIS — I10 Essential (primary) hypertension: Secondary | ICD-10-CM | POA: Diagnosis present

## 2017-02-03 DIAGNOSIS — Z9221 Personal history of antineoplastic chemotherapy: Secondary | ICD-10-CM | POA: Diagnosis not present

## 2017-02-03 DIAGNOSIS — J189 Pneumonia, unspecified organism: Secondary | ICD-10-CM | POA: Diagnosis present

## 2017-02-03 DIAGNOSIS — H3530 Unspecified macular degeneration: Secondary | ICD-10-CM | POA: Diagnosis present

## 2017-02-03 DIAGNOSIS — Z8542 Personal history of malignant neoplasm of other parts of uterus: Secondary | ICD-10-CM

## 2017-02-03 DIAGNOSIS — E039 Hypothyroidism, unspecified: Secondary | ICD-10-CM | POA: Diagnosis not present

## 2017-02-03 DIAGNOSIS — Z880 Allergy status to penicillin: Secondary | ICD-10-CM | POA: Diagnosis not present

## 2017-02-03 DIAGNOSIS — I509 Heart failure, unspecified: Secondary | ICD-10-CM

## 2017-02-03 DIAGNOSIS — Z90710 Acquired absence of both cervix and uterus: Secondary | ICD-10-CM | POA: Diagnosis not present

## 2017-02-03 DIAGNOSIS — X58XXXA Exposure to other specified factors, initial encounter: Secondary | ICD-10-CM | POA: Diagnosis present

## 2017-02-03 DIAGNOSIS — Z883 Allergy status to other anti-infective agents status: Secondary | ICD-10-CM

## 2017-02-03 DIAGNOSIS — Z85038 Personal history of other malignant neoplasm of large intestine: Secondary | ICD-10-CM

## 2017-02-03 DIAGNOSIS — Z881 Allergy status to other antibiotic agents status: Secondary | ICD-10-CM

## 2017-02-03 DIAGNOSIS — S2220XA Unspecified fracture of sternum, initial encounter for closed fracture: Secondary | ICD-10-CM | POA: Diagnosis present

## 2017-02-03 DIAGNOSIS — R7611 Nonspecific reaction to tuberculin skin test without active tuberculosis: Secondary | ICD-10-CM | POA: Diagnosis not present

## 2017-02-03 DIAGNOSIS — I340 Nonrheumatic mitral (valve) insufficiency: Secondary | ICD-10-CM | POA: Diagnosis present

## 2017-02-03 DIAGNOSIS — Z923 Personal history of irradiation: Secondary | ICD-10-CM

## 2017-02-03 DIAGNOSIS — Z9049 Acquired absence of other specified parts of digestive tract: Secondary | ICD-10-CM | POA: Diagnosis not present

## 2017-02-03 DIAGNOSIS — K559 Vascular disorder of intestine, unspecified: Secondary | ICD-10-CM | POA: Diagnosis present

## 2017-02-03 DIAGNOSIS — J181 Lobar pneumonia, unspecified organism: Secondary | ICD-10-CM

## 2017-02-03 DIAGNOSIS — M8008XA Age-related osteoporosis with current pathological fracture, vertebra(e), initial encounter for fracture: Secondary | ICD-10-CM | POA: Diagnosis present

## 2017-02-03 DIAGNOSIS — H9190 Unspecified hearing loss, unspecified ear: Secondary | ICD-10-CM | POA: Diagnosis not present

## 2017-02-03 DIAGNOSIS — I110 Hypertensive heart disease with heart failure: Secondary | ICD-10-CM | POA: Diagnosis not present

## 2017-02-03 DIAGNOSIS — Z888 Allergy status to other drugs, medicaments and biological substances status: Secondary | ICD-10-CM

## 2017-02-03 DIAGNOSIS — Z8249 Family history of ischemic heart disease and other diseases of the circulatory system: Secondary | ICD-10-CM | POA: Diagnosis not present

## 2017-02-03 DIAGNOSIS — M8088XA Other osteoporosis with current pathological fracture, vertebra(e), initial encounter for fracture: Secondary | ICD-10-CM | POA: Diagnosis not present

## 2017-02-03 DIAGNOSIS — I5043 Acute on chronic combined systolic (congestive) and diastolic (congestive) heart failure: Secondary | ICD-10-CM | POA: Diagnosis not present

## 2017-02-03 DIAGNOSIS — Z79899 Other long term (current) drug therapy: Secondary | ICD-10-CM | POA: Diagnosis not present

## 2017-02-03 DIAGNOSIS — R9431 Abnormal electrocardiogram [ECG] [EKG]: Secondary | ICD-10-CM | POA: Diagnosis not present

## 2017-02-03 DIAGNOSIS — M7989 Other specified soft tissue disorders: Secondary | ICD-10-CM | POA: Diagnosis not present

## 2017-02-03 DIAGNOSIS — R7989 Other specified abnormal findings of blood chemistry: Secondary | ICD-10-CM | POA: Diagnosis not present

## 2017-02-03 LAB — CBC WITH DIFFERENTIAL/PLATELET
BASOS ABS: 0 10*3/uL (ref 0.0–0.1)
Basophils Relative: 0 %
EOS PCT: 0 %
Eosinophils Absolute: 0 10*3/uL (ref 0.0–0.7)
HCT: 34.6 % — ABNORMAL LOW (ref 36.0–46.0)
HEMOGLOBIN: 11.2 g/dL — AB (ref 12.0–15.0)
LYMPHS PCT: 13 %
Lymphs Abs: 0.8 10*3/uL (ref 0.7–4.0)
MCH: 30 pg (ref 26.0–34.0)
MCHC: 32.4 g/dL (ref 30.0–36.0)
MCV: 92.8 fL (ref 78.0–100.0)
Monocytes Absolute: 0.8 10*3/uL (ref 0.1–1.0)
Monocytes Relative: 14 %
NEUTROS PCT: 73 %
Neutro Abs: 4.4 10*3/uL (ref 1.7–7.7)
PLATELETS: 424 10*3/uL — AB (ref 150–400)
RBC: 3.73 MIL/uL — AB (ref 3.87–5.11)
RDW: 14.3 % (ref 11.5–15.5)
WBC: 6 10*3/uL (ref 4.0–10.5)

## 2017-02-03 LAB — BASIC METABOLIC PANEL
ANION GAP: 10 (ref 5–15)
BUN: 13 mg/dL (ref 6–20)
CHLORIDE: 99 mmol/L — AB (ref 101–111)
CO2: 29 mmol/L (ref 22–32)
CREATININE: 0.59 mg/dL (ref 0.44–1.00)
Calcium: 8.5 mg/dL — ABNORMAL LOW (ref 8.9–10.3)
GFR calc non Af Amer: >60 mL/min (ref >60)
Glucose, Bld: 110 mg/dL — ABNORMAL HIGH (ref 65–99)
POTASSIUM: 3.4 mmol/L — AB (ref 3.5–5.1)
SODIUM: 138 mmol/L (ref 135–145)

## 2017-02-03 LAB — I-STAT TROPONIN, ED: Troponin i, poc: 0.04 ng/mL (ref 0.00–0.08)

## 2017-02-03 LAB — BRAIN NATRIURETIC PEPTIDE: B NATRIURETIC PEPTIDE 5: 391.6 pg/mL — AB (ref 0.0–100.0)

## 2017-02-03 MED ORDER — IOPAMIDOL (ISOVUE-370) INJECTION 76%
75.0000 mL | Freq: Once | INTRAVENOUS | Status: AC | PRN
  Administered 2017-02-03: 75 mL via INTRAVENOUS

## 2017-02-03 MED ORDER — AZITHROMYCIN 250 MG PO TABS
500.0000 mg | ORAL_TABLET | Freq: Once | ORAL | Status: AC
  Administered 2017-02-03: 500 mg via ORAL
  Filled 2017-02-03: qty 2

## 2017-02-03 MED ORDER — FUROSEMIDE 10 MG/ML IJ SOLN
20.0000 mg | Freq: Once | INTRAMUSCULAR | Status: AC
  Administered 2017-02-03: 20 mg via INTRAVENOUS
  Filled 2017-02-03: qty 4

## 2017-02-03 MED ORDER — DEXTROSE 5 % IV SOLN
1.0000 g | Freq: Once | INTRAVENOUS | Status: AC
  Administered 2017-02-03: 1 g via INTRAVENOUS
  Filled 2017-02-03: qty 10

## 2017-02-03 MED ORDER — IOPAMIDOL (ISOVUE-370) INJECTION 76%
INTRAVENOUS | Status: AC
  Filled 2017-02-03: qty 100

## 2017-02-03 NOTE — ED Notes (Signed)
Pt had drawn for labs:  Blue Gold Lavender Lt green Dark green x2 

## 2017-02-03 NOTE — ED Notes (Signed)
Patient was assisted to the restroom. Patient ambulated with steady gait while using her cane from home. Patient contaminated urine specimen with fecal matter. Will reattempt at a later time.

## 2017-02-03 NOTE — ED Notes (Signed)
Please call Katie for report at [phone]. Phone # [phone]

## 2017-02-03 NOTE — ED Triage Notes (Signed)
Family reports pt has been having SOB for the past week. Went to PCP who ordered bloodwork and chest x ray. Pt got a call from the PCP who said her d dimer was elevated. Pt reports her SOB has gradually gotten worse.

## 2017-02-03 NOTE — ED Provider Notes (Signed)
[facility] DEPT Provider Note   CSN: [id number] Arrival date & time: 02/03/17  1516     History   Chief Complaint Chief Complaint  Patient presents with  . Shortness of Breath  . Abnormal Lab    HPI Lori [name] is a 83 y.o. female.  The history is provided by the patient. No language interpreter was used.  Shortness of Breath   Abnormal Lab    Lori [name] is a 83 y.o. female who presents to the Emergency Department complaining of sob, abnormal lab.  She presents to the emergency department by referral from her primary care provider for evaluation of shortness of breath. She reports several months of progressive shortness of breath with dyspnea on exertion. Over the last few weeks she's had swelling to bilateral ankles. No fevers, cough, vomiting. She has been recently evaluated by gastroenterology for ischemic colitis. She saw her PCP on Friday who did outpatient labs. She had a d-dimer drawn that was elevated. Her daughter was contacted yesterday and told to bring her into the emergency department for further evaluation. She denies any chest pain.  Had a fall in May.    Past Medical History:  Diagnosis Date  . Colon cancer ([place]) 2009   s/p colon resection  . Endometrial cancer ([place]) 1989   endometrial s/p hysterectomy, chemo and radiation  . Hearing loss   . History of blood transfusion   . Hypertension   . Macular degeneration   . Migraines   . Pneumonia   . Positive TB test   . Stroke [facility]) 2004   ? TIA, brief episode of speech issues - evaluate OH    Patient Active Problem List   Diagnosis Date Noted  . Community acquired pneumonia/Rt LL 02/03/2017  . CHF (congestive heart failure)/Unspecified 02/03/2017  . Vertebral fracture, osteoporotic/T5 02/03/2017  . Sternal fracture 02/03/2017  . Hyperglycemia 10/10/2016  . Back pain 10/09/2016  . Change in bowel habits 07/24/2013  . History of colon cancer 07/24/2013  . History of endometrial cancer  07/24/2013  . Loss of weight 07/24/2013  . Cystitis 03/16/2013  . Essential hypertension, benign 11/24/2012  . Hx of TIA (transient ischemic attack)  11/24/2012  . Hypothyroidism 11/24/2012    Past Surgical History:  Procedure Laterality Date  . ABDOMINAL HYSTERECTOMY  1989  . APPENDECTOMY    . COLON RESECTION  08/27/2007  . GALLBLADDER SURGERY  1970  . SUBDURAL HEMATOMA EVACUATION VIA CRANIOTOMY  2002  . TONSILLECTOMY AND ADENOIDECTOMY  1943    OB History    No data available       Home Medications    Prior to Admission medications   Medication Sig Start Date End Date Taking? Authorizing Provider  acetaminophen (TYLENOL) 500 MG tablet Take 500 mg by mouth as needed.   Yes [provider]  Cholecalciferol (VITAMIN D PO) Take 1 capsule by mouth daily.    Yes [provider]  levothyroxine (SYNTHROID, LEVOTHROID) 50 MCG tablet TAKE 2 TABLETS ON MONDAY AND FRIDAY. TAKE 1 TABLET ALL OTHER DAYS 11/26/16  Yes Colin Benton R, DO  lisinopril (PRINIVIL,ZESTRIL) 20 MG tablet TAKE 1 TABLET (20 MG TOTAL) BY MOUTH DAILY. 12/13/16  Yes Lucretia Kern, DO  Multiple Vitamins-Minerals (EYE VITAMINS PO) Take 1 tablet by mouth 2 (two) times daily.    Yes [provider]  Riboflavin (VITAMIN B-2 PO) Take 1 tablet by mouth daily.    Yes [provider]  vitamin C (ASCORBIC ACID) 500  MG tablet Take 500 mg by mouth daily.   Yes [provider]  triamcinolone cream (KENALOG) 0.1 % Apply 1 application topically 2 (two) times daily. Patient not taking: Reported on 02/03/2017 11/08/16   Lucretia Kern, DO    Family History Family History  Problem Relation Age of Onset  . Arthritis Mother   . Heart disease Father   . Hyperlipidemia Father   . Hypertension Father   . Diabetes Sister   . Crohn's disease Daughter     Social History Social History  Substance Use Topics  . Smoking status: Never Smoker  . Smokeless tobacco: Never Used  . Alcohol use Yes      Comment: occ glass of wine      Allergies   Bactrim [sulfamethoxazole-trimethoprim]; Clindamycin/lincomycin; Macrobid [nitrofurantoin macrocrystal]; Penicillins; Sulfa antibiotics; and Zofran [ondansetron hcl]   Review of Systems Review of Systems  Respiratory: Positive for shortness of breath.   All other systems reviewed and are negative.    Physical Exam Updated Vital Signs BP 122/80   Pulse 74   Temp 97.9 F (36.6 C) (Oral)   Resp 19   SpO2 95%   Physical Exam  Constitutional: She is oriented to person, place, and time. She appears well-developed and well-nourished.  frail  HENT:  Head: Normocephalic and atraumatic.  Cardiovascular: Normal rate and regular rhythm.   No murmur heard. Pulmonary/Chest: Effort normal and breath sounds normal. No respiratory distress.  Abdominal: Soft. There is no tenderness. There is no rebound and no guarding.  Musculoskeletal: She exhibits no tenderness.  1+ pitting edema to bilateral lower extremities  Neurological: She is alert and oriented to person, place, and time.  Skin: Skin is warm and dry.  Psychiatric: She has a normal mood and affect. Her behavior is normal.  Nursing note and vitals reviewed.    ED Treatments / Results  Labs (all labs ordered are listed, but only abnormal results are displayed) Labs Reviewed  BASIC METABOLIC PANEL - Abnormal; Notable for the following:       Result Value   Potassium 3.4 (*)    Chloride 99 (*)    Glucose, Bld 110 (*)    Calcium 8.5 (*)    All other components within normal limits  BRAIN NATRIURETIC PEPTIDE - Abnormal; Notable for the following:    B Natriuretic Peptide 391.6 (*)    All other components within normal limits  CBC WITH DIFFERENTIAL/PLATELET - Abnormal; Notable for the following:    RBC 3.73 (*)    Hemoglobin 11.2 (*)    HCT 34.6 (*)    Platelets 424 (*)    All other components within normal limits  CULTURE, BLOOD (ROUTINE X 2)  CULTURE, BLOOD (ROUTINE X 2)    BASIC METABOLIC PANEL  CBC  I-STAT TROPONIN, ED    EKG  EKG Interpretation  Date/Time:  Sunday February 03 2017 15:37:38 EDT Ventricular Rate:  80 PR Interval:    QRS Duration: 115 QT Interval:  379 QTC Calculation: 438 R Axis:   -47 Text Interpretation:  Sinus rhythm Multiform ventricular premature complexes Incomplete left bundle branch block LVH with secondary repolarization abnormality Inferior infarct, old Anterior Q waves, possibly due to LVH Baseline wander in lead(s) I III aVL Interpretation limited secondary to artifact Confirmed by Quintella Reichert [phone]) on 02/03/2017 4:12:47 PM       Radiology Ct Angio Chest Pe W/cm &/or Wo Cm  Result Date: 02/03/2017 CLINICAL DATA:  Shortness of breath for 1 week,  elevated D-dimer. History of colon cancer, endometrial cancer, positive tuberculosis test. EXAM: CT ANGIOGRAPHY CHEST WITH CONTRAST TECHNIQUE: Multidetector CT imaging of the chest was performed using the standard protocol during bolus administration of intravenous contrast. Multiplanar CT image reconstructions and MIPs were obtained to evaluate the vascular anatomy. CONTRAST:  75 cc Isovue 370 COMPARISON:  Chest radiograph February 01, 2017 and chest radiograph Oct 09, 2016 FINDINGS: CARDIOVASCULAR: Adequate contrast opacification of the pulmonary artery's. Main pulmonary artery is borderline enlarged at 3 cm. No pulmonary arterial filling defects to the level of the subsegmental branches. The heart is moderately enlarged. No pericardial effusion. Mild pulmonary vascular congestion. Mild coronary artery calcifications. No pericardial effusion. Thoracic aorta is normal course and caliber, mild calcific atherosclerosis. MEDIASTINUM/NODES: No lymphadenopathy by CT size criteria. LUNGS/PLEURA: Tracheobronchial tree is patent, no pneumothorax. Small RIGHT lower lobe consolidation. Bibasilar atelectasis. Tiny calcified granuloma LEFT lung apex. Heterogeneous lung attenuation. No pleural  effusion. UPPER ABDOMEN: Included view of the abdomen is unremarkable. MUSCULOSKELETAL: Acute to subacute appearing displaced sternal fracture, new from May 2018. New subacute T5 severe compression fracture. Old mild T9, moderate T10 compression fractures. Severe RIGHT shoulder osteoarthrosis. Old RIGHT posterior rib fractures. Broad levoscoliosis. Review of the MIP images confirms the above findings. IMPRESSION: 1. No acute pulmonary embolism. 2. Small RIGHT lower lobe consolidation concerning for pneumonia. 3. Moderate cardiomegaly and vascular congestion. Heterogeneous lung attenuation seen with pulmonary edema/small airway disease. 4. New acute to subacute appearing displaced sternal fracture. New, subacute severe T5 compression fracture. Aortic Atherosclerosis (ICD10-I70.0). Electronically Signed   By: Elon Alas M.D.   On: 02/03/2017 18:05    Procedures Procedures (including critical care time)  Medications Ordered in ED Medications  iopamidol (ISOVUE-370) 76 % injection (not administered)  lisinopril (PRINIVIL,ZESTRIL) tablet 20 mg (not administered)  riboflavin (VITAMIN B-2) tablet 100 mg (not administered)  vitamin C (ASCORBIC ACID) tablet 500 mg (not administered)  cholecalciferol (VITAMIN D) tablet 1,000 Units (not administered)  multivitamin (PROSIGHT) tablet (not administered)  levothyroxine (SYNTHROID, LEVOTHROID) tablet 50 mcg (not administered)  sodium chloride flush (NS) 0.9 % injection 3 mL (not administered)  sodium chloride flush (NS) 0.9 % injection 3 mL (not administered)  0.9 %  sodium chloride infusion (not administered)  acetaminophen (TYLENOL) tablet 650 mg (not administered)    Or  acetaminophen (TYLENOL) suppository 650 mg (not administered)  traZODone (DESYREL) tablet 50 mg (not administered)  senna (SENOKOT) tablet 8.6 mg (not administered)  polyethylene glycol (MIRALAX / GLYCOLAX) packet 17 g (not administered)  ondansetron (ZOFRAN) tablet 4 mg (not  administered)    Or  ondansetron (ZOFRAN) injection 4 mg (not administered)  albuterol (PROVENTIL) (2.5 MG/3ML) 0.083% nebulizer solution 2.5 mg (not administered)  heparin injection 5,000 Units (not administered)  potassium chloride SA (K-DUR,KLOR-CON) CR tablet 40 mEq (not administered)  guaiFENesin (MUCINEX) 12 hr tablet 600 mg (not administered)  azithromycin (ZITHROMAX) 500 mg in dextrose 5 % 250 mL IVPB (not administered)  cefTRIAXone (ROCEPHIN) 1 g in dextrose 5 % 50 mL IVPB (not administered)  furosemide (LASIX) injection 20 mg (not administered)  calcium carbonate (TUMS - dosed in mg elemental calcium) chewable tablet 200 mg of elemental calcium (not administered)  calcitonin (salmon) (MIACALCIN/FORTICAL) nasal spray 1 spray (not administered)  iopamidol (ISOVUE-370) 76 % injection 75 mL (75 mLs Intravenous Contrast Given 02/03/17 1729)  cefTRIAXone (ROCEPHIN) 1 g in dextrose 5 % 50 mL IVPB (0 g Intravenous Stopped 02/03/17 2037)  azithromycin (ZITHROMAX) tablet 500 mg (500 mg Oral Given  02/03/17 1952)  furosemide (LASIX) injection 20 mg (20 mg Intravenous Given 02/03/17 1953)     Initial Impression / Assessment and Plan / ED Course  I have reviewed the triage vital signs and the nursing notes.  Pertinent labs & imaging results that were available during my care of the patient were reviewed by me and considered in my medical decision making (see chart for details).     Patient here for progressive shortness of breath, referred for elevated d-dimer.  She is mildly edematous on examination, BNP is elevated. CTA is consistent with CHF as well as pneumonia. Treated with antibiotics for possible community acquired pneumonia and diuresis for CHF. Hospitalist consulted for admission for new onset CHF with pneumonia.  Final Clinical Impressions(s) / ED Diagnoses   Final diagnoses:  Community acquired pneumonia of right lower lobe of lung [facility])    New Prescriptions Current Discharge  Medication List       Quintella Reichert, MD 02/04/17 [phone]

## 2017-02-03 NOTE — ED Notes (Signed)
ED TO INPATIENT HANDOFF REPORT  Name/Age/Gender Lori [name] 83 y.o. female  Code Status Code Status History    Date Active Date Inactive Code Status Order ID Comments User Context   10/09/2016  8:40 PM 10/11/2016  6:57 PM DNR [id number]  Karmen Bongo, MD Inpatient    Questions for Most Recent Historical Code Status (Order [id number])    Question Answer Comment   In the event of cardiac or respiratory ARREST Do not call a "code blue"    In the event of cardiac or respiratory ARREST Do not perform Intubation, CPR, defibrillation or ACLS    In the event of cardiac or respiratory ARREST Use medication by any route, position, wound care, and other measures to relive pain and suffering. May use oxygen, suction and manual treatment of airway obstruction as needed for comfort.         Advance Directive Documentation     Most Recent Value  Type of Advance Directive  Healthcare Power of Attorney, Living will  Pre-existing out of facility DNR order (yellow form or pink MOST form)  -  "MOST" Form in Place?  -      Home/SNF/Other Home  Chief Complaint Needs ct scan of lung, possible blood clot in lung, sent by dr  Level of Care/Admitting Diagnosis ED Disposition    ED Disposition Condition [place]: [place] [[id number]]  Level of Care: Telemetry [5]  Admit to tele based on following criteria: Acute CHF  Diagnosis: CHF (congestive heart failure) [facility]) [[id number]]  Admitting Physician: Morrison Old  Attending Physician: Morrison Old  Estimated length of stay: 3 - 4 days  Certification:: I certify this patient will need inpatient services for at least 2 midnights  Bed request comments: tele  PT Class (Do Not Modify): Inpatient [101]  PT Acc Code (Do Not Modify): Private [1]       Medical History Past Medical History:  Diagnosis Date  . Colon cancer ([place]) 2009   s/p colon resection  . Endometrial cancer ([place])  1989   endometrial s/p hysterectomy, chemo and radiation  . Hearing loss   . History of blood transfusion   . Hypertension   . Macular degeneration   . Migraines   . Pneumonia   . Positive TB test   . Stroke [facility]) 2004   ? TIA, brief episode of speech issues - evaluate [place]    Allergies Allergies  Allergen Reactions  . Bactrim [Sulfamethoxazole-Trimethoprim] Itching    Blisters   . Clindamycin/Lincomycin Other (See Comments)    GI upset  . Macrobid [Nitrofurantoin Macrocrystal] Itching  . Penicillins Itching    Has patient had a PCN reaction causing immediate rash, facial/tongue/throat swelling, SOB or lightheadedness with hypotension Unknown Has patient had a PCN reaction causing severe rash involving mucus membranes or skin necrosis: Unknown Has patient had a PCN reaction that required hospitalization: Unknown Has patient had a PCN reaction occurring within the last 10 years:Unknown If all of the above answers are "NO", then may proceed with Cephalosporin use.   . Sulfa Antibiotics Itching  . Zofran [Ondansetron Hcl] Hives    IV Location/Drains/Wounds Patient Lines/Drains/Airways Status   Active Line/Drains/Airways    Name:   Placement date:   Placement time:   Site:   Days:   Peripheral IV 02/03/17 Left Antecubital  02/03/17    1703    Antecubital    less than 1  Labs/Imaging Results for orders placed or performed during the hospital encounter of 02/03/17 (from the past 48 hour(s))  Basic metabolic panel     Status: Abnormal   Collection Time: 02/03/17  3:51 PM  Result Value Ref Range   Sodium 138 135 - 145 mmol/L   Potassium 3.4 (L) 3.5 - 5.1 mmol/L   Chloride 99 (L) 101 - 111 mmol/L   CO2 29 22 - 32 mmol/L   Glucose, Bld 110 (H) 65 - 99 mg/dL   BUN 13 6 - 20 mg/dL   Creatinine, Ser 0.59 0.44 - 1.00 mg/dL   Calcium 8.5 (L) 8.9 - 10.3 mg/dL   GFR calc non Af Amer >60 >60 mL/min   GFR calc Af Amer >60 >60 mL/min    Comment: (NOTE) The eGFR has been  calculated using the CKD EPI equation. This calculation has not been validated in all clinical situations. eGFR's persistently <60 mL/min signify possible Chronic Kidney Disease.    Anion gap 10 5 - 15  Brain natriuretic peptide     Status: Abnormal   Collection Time: 02/03/17  3:51 PM  Result Value Ref Range   B Natriuretic Peptide 391.6 (H) 0.0 - 100.0 pg/mL  CBC with Differential     Status: Abnormal   Collection Time: 02/03/17  3:51 PM  Result Value Ref Range   WBC 6.0 4.0 - 10.5 K/uL   RBC 3.73 (L) 3.87 - 5.11 MIL/uL   Hemoglobin 11.2 (L) 12.0 - 15.0 g/dL   HCT 34.6 (L) 36.0 - 46.0 %   MCV 92.8 78.0 - 100.0 fL   MCH 30.0 26.0 - 34.0 pg   MCHC 32.4 30.0 - 36.0 g/dL   RDW 14.3 11.5 - 15.5 %   Platelets 424 (H) 150 - 400 K/uL   Neutrophils Relative % 73 %   Neutro Abs 4.4 1.7 - 7.7 K/uL   Lymphocytes Relative 13 %   Lymphs Abs 0.8 0.7 - 4.0 K/uL   Monocytes Relative 14 %   Monocytes Absolute 0.8 0.1 - 1.0 K/uL   Eosinophils Relative 0 %   Eosinophils Absolute 0.0 0.0 - 0.7 K/uL   Basophils Relative 0 %   Basophils Absolute 0.0 0.0 - 0.1 K/uL  I-stat troponin, ED     Status: None   Collection Time: 02/03/17  5:03 PM  Result Value Ref Range   Troponin i, poc 0.04 0.00 - 0.08 ng/mL   Comment 3            Comment: Due to the release kinetics of cTnI, a negative result within the first hours of the onset of symptoms does not rule out myocardial infarction with certainty. If myocardial infarction is still suspected, repeat the test at appropriate intervals.    Ct Angio Chest Pe W/cm &/or Wo Cm  Result Date: 02/03/2017 CLINICAL DATA:  Shortness of breath for 1 week, elevated D-dimer. History of colon cancer, endometrial cancer, positive tuberculosis test. EXAM: CT ANGIOGRAPHY CHEST WITH CONTRAST TECHNIQUE: Multidetector CT imaging of the chest was performed using the standard protocol during bolus administration of intravenous contrast. Multiplanar CT image reconstructions  and MIPs were obtained to evaluate the vascular anatomy. CONTRAST:  75 cc Isovue 370 COMPARISON:  Chest radiograph February 01, 2017 and chest radiograph Oct 09, 2016 FINDINGS: CARDIOVASCULAR: Adequate contrast opacification of the pulmonary artery's. Main pulmonary artery is borderline enlarged at 3 cm. No pulmonary arterial filling defects to the level of the subsegmental branches. The heart is moderately enlarged. No  pericardial effusion. Mild pulmonary vascular congestion. Mild coronary artery calcifications. No pericardial effusion. Thoracic aorta is normal course and caliber, mild calcific atherosclerosis. MEDIASTINUM/NODES: No lymphadenopathy by CT size criteria. LUNGS/PLEURA: Tracheobronchial tree is patent, no pneumothorax. Small RIGHT lower lobe consolidation. Bibasilar atelectasis. Tiny calcified granuloma LEFT lung apex. Heterogeneous lung attenuation. No pleural effusion. UPPER ABDOMEN: Included view of the abdomen is unremarkable. MUSCULOSKELETAL: Acute to subacute appearing displaced sternal fracture, new from May 2018. New subacute T5 severe compression fracture. Old mild T9, moderate T10 compression fractures. Severe RIGHT shoulder osteoarthrosis. Old RIGHT posterior rib fractures. Broad levoscoliosis. Review of the MIP images confirms the above findings. IMPRESSION: 1. No acute pulmonary embolism. 2. Small RIGHT lower lobe consolidation concerning for pneumonia. 3. Moderate cardiomegaly and vascular congestion. Heterogeneous lung attenuation seen with pulmonary edema/small airway disease. 4. New acute to subacute appearing displaced sternal fracture. New, subacute severe T5 compression fracture. Aortic Atherosclerosis (ICD10-I70.0). Electronically Signed   By: Elon Alas M.D.   On: 02/03/2017 18:05    Pending Labs Unresulted Labs    Start     Ordered   02/03/17 1838  Culture, blood (Routine X 2) w Reflex to ID Panel  BLOOD CULTURE X 2,   R    Comments:  Blood cultures x 2 from  peripheral line   Question:  Patient immune status  Answer:  Normal   02/03/17 1838   Signed and Held  Basic metabolic panel  Tomorrow morning,   R     Signed and Held   Signed and Held  CBC  Tomorrow morning,   R     Signed and Held      Vitals/Pain Today's Vitals   02/03/17 2100 02/03/17 2200 02/03/17 2216 02/03/17 2230  BP: 135/64 (!) 138/93 (!) 138/93 (!) 151/91  Pulse: (!) 44 (!) 51 88 91  Resp: (!) 21 (!) 31 20 (!) 23  Temp:      TempSrc:      SpO2: 96% 96% 96% (!) 89%    Isolation Precautions No active isolations  Medications Medications  iopamidol (ISOVUE-370) 76 % injection (not administered)  iopamidol (ISOVUE-370) 76 % injection 75 mL (75 mLs Intravenous Contrast Given 02/03/17 1729)  cefTRIAXone (ROCEPHIN) 1 g in dextrose 5 % 50 mL IVPB (0 g Intravenous Stopped 02/03/17 2037)  azithromycin (ZITHROMAX) tablet 500 mg (500 mg Oral Given 02/03/17 1952)  furosemide (LASIX) injection 20 mg (20 mg Intravenous Given 02/03/17 1953)    Mobility walks with person assist

## 2017-02-04 ENCOUNTER — Inpatient Hospital Stay (HOSPITAL_COMMUNITY): Payer: Medicare Other

## 2017-02-04 DIAGNOSIS — I10 Essential (primary) hypertension: Secondary | ICD-10-CM

## 2017-02-04 DIAGNOSIS — M7989 Other specified soft tissue disorders: Secondary | ICD-10-CM

## 2017-02-04 DIAGNOSIS — S2220XA Unspecified fracture of sternum, initial encounter for closed fracture: Secondary | ICD-10-CM

## 2017-02-04 DIAGNOSIS — I509 Heart failure, unspecified: Secondary | ICD-10-CM

## 2017-02-04 DIAGNOSIS — J189 Pneumonia, unspecified organism: Secondary | ICD-10-CM

## 2017-02-04 DIAGNOSIS — I340 Nonrheumatic mitral (valve) insufficiency: Secondary | ICD-10-CM

## 2017-02-04 DIAGNOSIS — E039 Hypothyroidism, unspecified: Secondary | ICD-10-CM

## 2017-02-04 DIAGNOSIS — M8088XA Other osteoporosis with current pathological fracture, vertebra(e), initial encounter for fracture: Secondary | ICD-10-CM

## 2017-02-04 LAB — TROPONIN I
TROPONIN I: 0.04 ng/mL — AB (ref <0.03)
TROPONIN I: 0.05 ng/mL — AB (ref <0.03)

## 2017-02-04 LAB — BASIC METABOLIC PANEL
Anion gap: 11 (ref 5–15)
BUN: 10 mg/dL (ref 6–20)
CALCIUM: 8.1 mg/dL — AB (ref 8.9–10.3)
CHLORIDE: 98 mmol/L — AB (ref 101–111)
CO2: 29 mmol/L (ref 22–32)
CREATININE: 0.64 mg/dL (ref 0.44–1.00)
GFR calc non Af Amer: >60 mL/min (ref >60)
GLUCOSE: 88 mg/dL (ref 65–99)
Potassium: 3.4 mmol/L — ABNORMAL LOW (ref 3.5–5.1)
Sodium: 138 mmol/L (ref 135–145)

## 2017-02-04 LAB — CBC
HCT: 32.2 % — ABNORMAL LOW (ref 36.0–46.0)
Hemoglobin: 10.5 g/dL — ABNORMAL LOW (ref 12.0–15.0)
MCH: 29.5 pg (ref 26.0–34.0)
MCHC: 32.6 g/dL (ref 30.0–36.0)
MCV: 90.4 fL (ref 78.0–100.0)
PLATELETS: 393 10*3/uL (ref 150–400)
RBC: 3.56 MIL/uL — AB (ref 3.87–5.11)
RDW: 14.1 % (ref 11.5–15.5)
WBC: 6.9 10*3/uL (ref 4.0–10.5)

## 2017-02-04 LAB — ECHOCARDIOGRAM COMPLETE
Height: 60 in
Weight: 1418 oz

## 2017-02-04 MED ORDER — CALCIUM CARBONATE ANTACID 500 MG PO CHEW: 1.0000 | CHEWABLE_TABLET | Freq: Two times a day (BID) | ORAL | Status: DC

## 2017-02-04 MED ORDER — ONDANSETRON HCL 4 MG PO TABS: 4.0000 mg | ORAL_TABLET | Freq: Four times a day (QID) | ORAL | Status: DC | PRN

## 2017-02-04 MED ORDER — VITAMIN D (ERGOCALCIFEROL) 1.25 MG (50000 UNIT) PO CAPS
50000.0000 [IU] | ORAL_CAPSULE | ORAL | Status: DC
  Administered 2017-02-04: 50000 [IU] via ORAL
  Filled 2017-02-04: qty 1

## 2017-02-04 MED ORDER — HEPARIN SODIUM (PORCINE) 5000 UNIT/ML IJ SOLN
5000.0000 [IU] | Freq: Three times a day (TID) | INTRAMUSCULAR | Status: DC
  Administered 2017-02-04 – 2017-02-05 (×5): 5000 [IU] via SUBCUTANEOUS
  Filled 2017-02-04 (×5): qty 1

## 2017-02-04 MED ORDER — TRAZODONE HCL 50 MG PO TABS: 50.0000 mg | ORAL_TABLET | Freq: Every evening | ORAL | Status: DC | PRN

## 2017-02-04 MED ORDER — DEXTROSE 5 % IV SOLN: 500.0000 mg | INTRAVENOUS | Status: DC

## 2017-02-04 MED ORDER — POTASSIUM CHLORIDE CRYS ER 20 MEQ PO TBCR
40.0000 meq | EXTENDED_RELEASE_TABLET | Freq: Once | ORAL | Status: AC
  Administered 2017-02-04: 40 meq via ORAL
  Filled 2017-02-04: qty 2

## 2017-02-04 MED ORDER — CALCITONIN (SALMON) 200 UNIT/ACT NA SOLN: 1.0000 | Freq: Every day | NASAL | Status: DC

## 2017-02-04 MED ORDER — ACETAMINOPHEN 650 MG RE SUPP: 650.0000 mg | Freq: Four times a day (QID) | RECTAL | Status: DC | PRN

## 2017-02-04 MED ORDER — CALCIUM CARBONATE ANTACID 500 MG PO CHEW
1.0000 | CHEWABLE_TABLET | Freq: Two times a day (BID) | ORAL | Status: DC
  Administered 2017-02-05: 200 mg via ORAL
  Filled 2017-02-04 (×2): qty 1

## 2017-02-04 MED ORDER — SENNA 8.6 MG PO TABS
1.0000 | ORAL_TABLET | Freq: Two times a day (BID) | ORAL | Status: DC
  Administered 2017-02-04 – 2017-02-05 (×2): 8.6 mg via ORAL
  Filled 2017-02-04 (×2): qty 1

## 2017-02-04 MED ORDER — FUROSEMIDE 10 MG/ML IJ SOLN
20.0000 mg | Freq: Two times a day (BID) | INTRAMUSCULAR | Status: DC
  Administered 2017-02-04: 20 mg via INTRAVENOUS
  Filled 2017-02-04: qty 2

## 2017-02-04 MED ORDER — VITAMIN D3 25 MCG (1000 UNIT) PO TABS
1000.0000 [IU] | ORAL_TABLET | Freq: Every day | ORAL | Status: DC
  Administered 2017-02-04 – 2017-02-05 (×2): 1000 [IU] via ORAL
  Filled 2017-02-04 (×2): qty 1

## 2017-02-04 MED ORDER — GUAIFENESIN ER 600 MG PO TB12
600.0000 mg | ORAL_TABLET | Freq: Two times a day (BID) | ORAL | Status: DC
  Administered 2017-02-04 – 2017-02-05 (×3): 600 mg via ORAL
  Filled 2017-02-04 (×4): qty 1

## 2017-02-04 MED ORDER — AZITHROMYCIN 250 MG PO TABS
500.0000 mg | ORAL_TABLET | Freq: Every day | ORAL | Status: DC
  Administered 2017-02-04: 500 mg via ORAL
  Filled 2017-02-04: qty 2

## 2017-02-04 MED ORDER — ONDANSETRON HCL 4 MG/2ML IJ SOLN: 4.0000 mg | Freq: Four times a day (QID) | INTRAMUSCULAR | Status: DC | PRN

## 2017-02-04 MED ORDER — VITAMIN C 500 MG PO TABS
500.0000 mg | ORAL_TABLET | Freq: Every day | ORAL | Status: DC
  Administered 2017-02-04 – 2017-02-05 (×2): 500 mg via ORAL
  Filled 2017-02-04 (×2): qty 1

## 2017-02-04 MED ORDER — SODIUM CHLORIDE 0.9% FLUSH: 3.0000 mL | INTRAVENOUS | Status: DC | PRN

## 2017-02-04 MED ORDER — DEXTROSE 5 % IV SOLN
1.0000 g | INTRAVENOUS | Status: DC
  Administered 2017-02-04: 1 g via INTRAVENOUS
  Filled 2017-02-04 (×2): qty 10

## 2017-02-04 MED ORDER — VITAMIN B-2 100 MG PO TABS: 100.0000 mg | ORAL_TABLET | Freq: Every day | ORAL | Status: DC

## 2017-02-04 MED ORDER — PROSIGHT PO TABS
ORAL_TABLET | Freq: Two times a day (BID) | ORAL | Status: DC
  Administered 2017-02-04 – 2017-02-05 (×3): 1 via ORAL
  Filled 2017-02-04 (×4): qty 1

## 2017-02-04 MED ORDER — ACETAMINOPHEN 325 MG PO TABS: 650.0000 mg | ORAL_TABLET | Freq: Four times a day (QID) | ORAL | Status: DC | PRN

## 2017-02-04 MED ORDER — POLYETHYLENE GLYCOL 3350 17 G PO PACK: 17.0000 g | PACK | Freq: Every day | ORAL | Status: DC | PRN

## 2017-02-04 MED ORDER — FUROSEMIDE 20 MG PO TABS
20.0000 mg | ORAL_TABLET | Freq: Every day | ORAL | Status: DC
  Administered 2017-02-05: 20 mg via ORAL
  Filled 2017-02-04: qty 1

## 2017-02-04 MED ORDER — POTASSIUM CHLORIDE CRYS ER 20 MEQ PO TBCR
40.0000 meq | EXTENDED_RELEASE_TABLET | Freq: Once | ORAL | Status: DC
  Filled 2017-02-04: qty 2

## 2017-02-04 MED ORDER — LISINOPRIL 20 MG PO TABS
20.0000 mg | ORAL_TABLET | Freq: Every day | ORAL | Status: DC
  Administered 2017-02-04 – 2017-02-05 (×2): 20 mg via ORAL
  Filled 2017-02-04 (×2): qty 1

## 2017-02-04 MED ORDER — SODIUM CHLORIDE 0.9% FLUSH
3.0000 mL | Freq: Two times a day (BID) | INTRAVENOUS | Status: DC
  Administered 2017-02-04 – 2017-02-05 (×4): 3 mL via INTRAVENOUS

## 2017-02-04 MED ORDER — SODIUM CHLORIDE 0.9 % IV SOLN: 250.0000 mL | INTRAVENOUS | Status: DC | PRN

## 2017-02-04 MED ORDER — LEVOTHYROXINE SODIUM 50 MCG PO TABS
50.0000 ug | ORAL_TABLET | Freq: Every day | ORAL | Status: DC
  Administered 2017-02-04 – 2017-02-05 (×2): 50 ug via ORAL
  Filled 2017-02-04 (×2): qty 1

## 2017-02-04 MED ORDER — CALCITONIN (SALMON) 200 UNIT/ACT NA SOLN
1.0000 | Freq: Every day | NASAL | Status: DC
  Administered 2017-02-04 – 2017-02-05 (×2): 1 via NASAL
  Filled 2017-02-04: qty 3.7

## 2017-02-04 MED ORDER — ALBUTEROL SULFATE (2.5 MG/3ML) 0.083% IN NEBU: 2.5000 mg | INHALATION_SOLUTION | RESPIRATORY_TRACT | Status: DC | PRN

## 2017-02-04 NOTE — Progress Notes (Signed)
PHARMACIST - PHYSICIAN ORDER COMMUNICATION  CONCERNING: P&T Medication Policy on Herbal Medications  DESCRIPTION:  This patient's order for:  riboflavin has been noted.  This product(s) is classified as an "herbal" or natural product. Due to a lack of definitive safety studies or FDA approval, nonstandard manufacturing practices, plus the potential risk of unknown drug-drug interactions while on inpatient medications, the Pharmacy and Therapeutics Committee does not permit the use of "herbal" or natural products of this type within [facility].   ACTION TAKEN: The pharmacy department is unable to verify this order at this time and your patient has been informed of this safety policy. Please reevaluate patient's clinical condition at discharge and address if the herbal or natural product(s) should be resumed at that time.   Thanks Dorrene German 02/04/2017 12:17 AM

## 2017-02-04 NOTE — H&P (Signed)
Patient Demographics:    Lori [name], is a 83 y.o. female  MRN: [id number]   DOB - May 04, 1919  Admit Date - 02/03/2017  Outpatient Primary MD for the patient is Lucretia Kern, DO   Assessment & Plan:    Principal Problem:   CHF (congestive heart failure)/Unspecified Active Problems:   Essential hypertension, benign   Hypothyroidism   Community acquired pneumonia/Rt LL   Vertebral fracture, osteoporotic/T5   Sternal fracture    1)Possible CHF  Exacer- EF unknown, patient with progressive shortness of breath, lower extremity edema, CTA chest suggest pulmonary venous congestion and cardiomegaly, BMP elevated. Check serial troponins, diurese gently with IV Lasix, monitor fluid balance and with carefully. Echocardiogram pending. Low-salt diet advised. Check TSH. Continue lisinopril  2)Possible PNA- radiologically CTA chest suggest pneumonia, however clinically diagnosis of pneumonia  is "soft",  patient has no productive cough ,no fevers,  no leukocytosis, granted she is 83 year old and may not have significant leukocytosis and fevers. IV Rocephin and azithromycin as ordered by ED provider pending cultures  3)Social/Ethics- plan of care and CODE STATUS discussed with patient and daughter, she is a full code  4)Sternal Fracture AND T5 fracture- patient is not very symptomatic at this time, consider orthospine consult if patient becomes more symptomatically,  vitamin D, calcium carbonate and fortical nose spray   With History of - Reviewed by me  Past Medical History:  Diagnosis Date  . Colon cancer ([facility]) 2009   s/p colon resection  . Endometrial cancer ([place]) 1989   endometrial s/p hysterectomy, chemo and radiation  . Hearing loss   . History of blood transfusion   . Hypertension   . Macular degeneration     . Migraines   . Pneumonia   . Positive TB test   . Stroke [facility]) 2004   ? TIA, brief episode of speech issues - evaluate OH      Past Surgical History:  Procedure Laterality Date  . ABDOMINAL HYSTERECTOMY  1989  . APPENDECTOMY    . COLON RESECTION  08/27/2007  . GALLBLADDER SURGERY  1970  . SUBDURAL HEMATOMA EVACUATION VIA CRANIOTOMY  2002  . [place]      Chief Complaint  Patient presents with  . Shortness of Breath  . Abnormal Lab      HPI:    Lori [name]  is a 83 y.o. female, With past medical history relevant for hypothyroidism and hypertension who presents with her daughter due to concerns about elevated d-dimer and that was done at PCPs office. Patient was seen by PCP on 10/01/2016 for dyspnea chest x-ray was unremarkable and d-dimer was done patient was called back and told that she should come to the ED for further evaluation. On further questioning patient's daughter reports dyspnea at rest and with exertion progressively for the last month or so. No orthopnea and no paroxysmal nocturnal dyspnea no frank chest pains. Patient has had some  lower extremity edema, but no leg pains.   In ED CTA chest is negative for PE however suggest right lower lobe pneumonia but patient denies any cough or fevers,  no chills. She does not have a leukocytosis  CT chest also showed sternal fracture as well as T5 fracture  No pleuritic symptoms no dizziness no falls    Review of systems:    In addition to the HPI above,   A full 12 point Review of 10 Systems was done, except as stated above, all other Review of 10 Systems were negative.    Social History:  Reviewed by me    Social History  Substance Use Topics  . Smoking status: Never Smoker  . Smokeless tobacco: Never Used  . Alcohol use Yes     Comment: occ glass of wine        Family History :  Reviewed by me    Family History  Problem Relation Age of Onset  . Arthritis Mother   .  Heart disease Father   . Hyperlipidemia Father   . Hypertension Father   . Diabetes Sister   . Crohn's disease Daughter      Home Medications:   Prior to Admission medications   Medication Sig Start Date End Date Taking? Authorizing Provider  acetaminophen (TYLENOL) 500 MG tablet Take 500 mg by mouth as needed.   Yes [provider]  Cholecalciferol (VITAMIN D PO) Take 1 capsule by mouth daily.    Yes [provider]  levothyroxine (SYNTHROID, LEVOTHROID) 50 MCG tablet TAKE 2 TABLETS ON MONDAY AND FRIDAY. TAKE 1 TABLET ALL OTHER DAYS 11/26/16  Yes Colin Benton R, DO  lisinopril (PRINIVIL,ZESTRIL) 20 MG tablet TAKE 1 TABLET (20 MG TOTAL) BY MOUTH DAILY. 12/13/16  Yes Lucretia Kern, DO  Multiple Vitamins-Minerals (EYE VITAMINS PO) Take 1 tablet by mouth 2 (two) times daily.    Yes [provider]  Riboflavin (VITAMIN B-2 PO) Take 1 tablet by mouth daily.    Yes [provider]  vitamin C (ASCORBIC ACID) 500 MG tablet Take 500 mg by mouth daily.   Yes [provider]  triamcinolone cream (KENALOG) 0.1 % Apply 1 application topically 2 (two) times daily. Patient not taking: Reported on 02/03/2017 11/08/16   Lucretia Kern, DO     Allergies:     Allergies  Allergen Reactions  . Bactrim [Sulfamethoxazole-Trimethoprim] Itching    Blisters   . Clindamycin/Lincomycin Other (See Comments)    GI upset  . Macrobid [Nitrofurantoin Macrocrystal] Itching  . Penicillins Itching    Has patient had a PCN reaction causing immediate rash, facial/tongue/throat swelling, SOB or lightheadedness with hypotension Unknown Has patient had a PCN reaction causing severe rash involving mucus membranes or skin necrosis: Unknown Has patient had a PCN reaction that required hospitalization: Unknown Has patient had a PCN reaction occurring within the last 10 years:Unknown If all of the above answers are "NO", then may proceed with Cephalosporin use.   . Sulfa Antibiotics  Itching  . Zofran [Ondansetron Hcl] Hives     Physical Exam:   Vitals  Blood pressure (!) 149/80, pulse (!) 58, temperature 98.4 F (36.9 C), temperature source Oral, resp. rate 20, height 5' (1.524 m), weight 40.5 kg (89 lb 4.6 oz), SpO2 98 %.  Physical Examination: General appearance - alert, well appearing, and in no distress and  Mental status - alert, oriented  Eyes - sclera anicteric Ears- HOH Neck - supple, no JVD elevation ,  Chest - diminished in bases, right more than left, faint bibasilar rales Heart - S1 and S2 normal,  Abdomen - soft, nontender, nondistended, no masses or organomegaly Neurological - screening mental status exam normal, neck supple without rigidity, cranial nerves II through XII intact, DTR's normal and symmetric Extremities -1 plus pedal edema noted, intact peripheral pulses,  neg homan's Skin - warm, dry    Data Review:    CBC  Recent Labs Lab 02/01/17 1234 02/03/17 1551  WBC 7.5 6.0  HGB 11.4* 11.2*  HCT 34.6* 34.6*  PLT 410.0* 424*  MCV 94.2 92.8  MCH  --  30.0  MCHC 33.1 32.4  RDW 14.0 14.3  LYMPHSABS  --  0.8  MONOABS  --  0.8  EOSABS  --  0.0  BASOSABS  --  0.0   ------------------------------------------------------------------------------------------------------------------  Chemistries   Recent Labs Lab 02/01/17 1234 02/03/17 1551  NA 137 138  K 3.5 3.4*  CL 98 99*  CO2 31 29  GLUCOSE 108* 110*  BUN 11 13  CREATININE 0.53 0.59  CALCIUM 8.5 8.5*   ------------------------------------------------------------------------------------------------------------------ estimated creatinine clearance is 26.3 mL/min (by C-G formula based on SCr of 0.59 mg/dL). ------------------------------------------------------------------------------------------------------------------  Recent Labs  02/01/17 1234  TSH 3.69     Coagulation profile No results for input(s): INR, PROTIME in the last 168  hours. -------------------------------------------------------------------------------------------------------------------  Recent Labs  02/01/17 1234  DDIMER 3.49*   -------------------------------------------------------------------------------------------------------------------  Cardiac Enzymes No results for input(s): CKMB, TROPONINI, MYOGLOBIN in the last 168 hours.  Invalid input(s): CK ------------------------------------------------------------------------------------------------------------------    Component Value Date/Time   BNP 391.6 (H) 02/03/2017 1551     ---------------------------------------------------------------------------------------------------------------  Urinalysis    Component Value Date/Time   BILIRUBINUR n 03/16/2013 1504   PROTEINUR n 03/16/2013 1504   UROBILINOGEN 0.2 03/16/2013 1504   NITRITE n 03/16/2013 1504   LEUKOCYTESUR large (3+) 03/16/2013 1504    ----------------------------------------------------------------------------------------------------------------   Imaging Results:    Ct Angio Chest Pe W/cm &/or Wo Cm  Result Date: 02/03/2017 CLINICAL DATA:  Shortness of breath for 1 week, elevated D-dimer. History of colon cancer, endometrial cancer, positive tuberculosis test. EXAM: CT ANGIOGRAPHY CHEST WITH CONTRAST TECHNIQUE: Multidetector CT imaging of the chest was performed using the standard protocol during bolus administration of intravenous contrast. Multiplanar CT image reconstructions and MIPs were obtained to evaluate the vascular anatomy. CONTRAST:  75 cc Isovue 370 COMPARISON:  Chest radiograph February 01, 2017 and chest radiograph Oct 09, 2016 FINDINGS: CARDIOVASCULAR: Adequate contrast opacification of the pulmonary artery's. Main pulmonary artery is borderline enlarged at 3 cm. No pulmonary arterial filling defects to the level of the subsegmental branches. The heart is moderately enlarged. No pericardial effusion. Mild  pulmonary vascular congestion. Mild coronary artery calcifications. No pericardial effusion. Thoracic aorta is normal course and caliber, mild calcific atherosclerosis. MEDIASTINUM/NODES: No lymphadenopathy by CT size criteria. LUNGS/PLEURA: Tracheobronchial tree is patent, no pneumothorax. Small RIGHT lower lobe consolidation. Bibasilar atelectasis. Tiny calcified granuloma LEFT lung apex. Heterogeneous lung attenuation. No pleural effusion. UPPER ABDOMEN: Included view of the abdomen is unremarkable. MUSCULOSKELETAL: Acute to subacute appearing displaced sternal fracture, new from May 2018. New subacute T5 severe compression fracture. Old mild T9, moderate T10 compression fractures. Severe RIGHT shoulder osteoarthrosis. Old RIGHT posterior rib fractures. Broad levoscoliosis. Review of the MIP images confirms the above findings. IMPRESSION: 1. No acute pulmonary embolism. 2. Small RIGHT lower lobe consolidation concerning for pneumonia. 3. Moderate cardiomegaly and vascular congestion. Heterogeneous lung attenuation seen with pulmonary edema/small airway disease. 4.  New acute to subacute appearing displaced sternal fracture. New, subacute severe T5 compression fracture. Aortic Atherosclerosis (ICD10-I70.0). Electronically Signed   By: Elon Alas M.D.   On: 02/03/2017 18:05    Radiological Exams on Admission: Ct Angio Chest Pe W/cm &/or Wo Cm  Result Date: 02/03/2017 CLINICAL DATA:  Shortness of breath for 1 week, elevated D-dimer. History of colon cancer, endometrial cancer, positive tuberculosis test. EXAM: CT ANGIOGRAPHY CHEST WITH CONTRAST TECHNIQUE: Multidetector CT imaging of the chest was performed using the standard protocol during bolus administration of intravenous contrast. Multiplanar CT image reconstructions and MIPs were obtained to evaluate the vascular anatomy. CONTRAST:  75 cc Isovue 370 COMPARISON:  Chest radiograph February 01, 2017 and chest radiograph Oct 09, 2016 FINDINGS:  CARDIOVASCULAR: Adequate contrast opacification of the pulmonary artery's. Main pulmonary artery is borderline enlarged at 3 cm. No pulmonary arterial filling defects to the level of the subsegmental branches. The heart is moderately enlarged. No pericardial effusion. Mild pulmonary vascular congestion. Mild coronary artery calcifications. No pericardial effusion. Thoracic aorta is normal course and caliber, mild calcific atherosclerosis. MEDIASTINUM/NODES: No lymphadenopathy by CT size criteria. LUNGS/PLEURA: Tracheobronchial tree is patent, no pneumothorax. Small RIGHT lower lobe consolidation. Bibasilar atelectasis. Tiny calcified granuloma LEFT lung apex. Heterogeneous lung attenuation. No pleural effusion. UPPER ABDOMEN: Included view of the abdomen is unremarkable. MUSCULOSKELETAL: Acute to subacute appearing displaced sternal fracture, new from May 2018. New subacute T5 severe compression fracture. Old mild T9, moderate T10 compression fractures. Severe RIGHT shoulder osteoarthrosis. Old RIGHT posterior rib fractures. Broad levoscoliosis. Review of the MIP images confirms the above findings. IMPRESSION: 1. No acute pulmonary embolism. 2. Small RIGHT lower lobe consolidation concerning for pneumonia. 3. Moderate cardiomegaly and vascular congestion. Heterogeneous lung attenuation seen with pulmonary edema/small airway disease. 4. New acute to subacute appearing displaced sternal fracture. New, subacute severe T5 compression fracture. Aortic Atherosclerosis (ICD10-I70.0). Electronically Signed   By: Elon Alas M.D.   On: 02/03/2017 18:05    DVT Prophylaxis -SCD  AM Labs Ordered, also please review Full Orders  Family Communication: Admission, patients condition and plan of care including tests being ordered have been discussed with the patient and daughter at bedside who indicate understanding and agree with the plan   Code Status - Full Code  Likely DC to  home  Condition   stable Dictation  #1 [id number]  [id number]   Denton Brick, Clorene Nerio M.D on 02/04/2017 at 12:32 AM   Between 7am to 7pm - Pager - [phone]  After 7pm go to [email or web address] - password [email or web address]  [facility] - Office  [phone]  Voice Recognition Viviann Spare dictation system was used to create this note, attempts have been made to correct errors. Please contact the author with questions and/or clarifications.

## 2017-02-04 NOTE — Progress Notes (Signed)
**  Preliminary report by tech**  Bilateral lower extremity venous duplex completed. There is no evidence of deep or superficial vein thrombosis involving the right and left lower extremities. All visualized vessels appear patent and compressible. There is no evidence of Baker's cysts bilaterally.  02/04/17 9:36 AM Lori [name] RVT

## 2017-02-04 NOTE — Progress Notes (Signed)
PROGRESS NOTE    Lori [name]  DGU:[id number] DOB: June 02, 1918 DOA: 02/03/2017 PCP: Lucretia Kern, DO (Confirm with patient/family/NH records and if not entered, this HAS to be entered at [facility] point of entry. "No PCP" if truly none.)   Brief Narrative: (Start on day 1 of progress note - keep it brief and live) Lori [name]  is Lori [name] 83 y.o. female, With past medical history relevant for hypothyroidism and hypertension who presents with her daughter due to concerns about elevated d-dimer and that was done at PCPs office admitted for possible HF and pneumonia.   Assessment & Plan:   Principal Problem:   CHF (congestive heart failure)/Unspecified Active Problems:   Essential hypertension, benign   Hypothyroidism   Community acquired pneumonia/Rt LL   Vertebral fracture, osteoporotic/T5   Sternal fracture   1) CHF  Exacerbation: elevated proBNP, CT findings with concern for pulm edema.  LEE this morning improved.  [ ]  lasix [ ]  echo -> EF 50% with hypokinesis, grade 2 diaystolic dysfunction.  MR with possible flail of anterior leaflet.   - cardiology c/s [ ]  serial troponins [ ]  normal TSH    2)Possible PNA- radiologically CTA chest suggest pneumonia  clinically diagnosis of pneumonia  is "soft" Bcx pending Ceftriaxone/azithro  4)Sternal Fracture AND T5 fracture- patient is not very symptomatic at this time, consider orthospine consult if patient becomes more symptomatically,  vitamin D, calcium carbonate and fortical nose spray  [ ]  discussed with CT surgery the sternal fracture, they'll arrange outpatient follow up  DVT prophylaxis: SCD Code Status: full Family Communication: daughter Disposition Plan: pending cards c/s   Consultants:   none  Procedures: (Don't include imaging studies which can be auto populated. Include things that cannot be auto populated i.e. Echo, Carotid and venous dopplers, Foley, Bipap, HD, tubes/drains, wound vac, central lines  etc)  ech  Antimicrobials: (specify start and planned stop date. Auto populated tables are space occupying and do not give end dates)  Ceftriaxone/azithro    Subjective: Was having SOB.  Seems better. Everything started after fall Lori [name] few months ago.  Denies back pain or CP now.   No swelling. Would feel comfortable going home.   Objective: Vitals:   02/04/17 0016 02/04/17 0020 02/04/17 0529 02/04/17 1029  BP:  (!) 149/80 130/80 118/71  Pulse:  (!) 58 90   Resp:  20 18   Temp:  98.4 F (36.9 C) 98.1 F (36.7 C)   TempSrc:  Oral Oral   SpO2:  98% 96%   Weight: 40.5 kg (89 lb 4.6 oz)  40.2 kg (88 lb 10 oz)   Height: 5' (1.524 m)       Intake/Output Summary (Last 24 hours) at 02/04/17 1046 Last data filed at 02/04/17 0600  Gross per 24 hour  Intake              170 ml  Output                0 ml  Net              170 ml   Filed Weights   02/04/17 0016 02/04/17 0529  Weight: 40.5 kg (89 lb 4.6 oz) 40.2 kg (88 lb 10 oz)    Examination:  General exam: Appears calm and comfortable  Respiratory system: Clear to auscultation. Respiratory effort normal. Cardiovascular system: S1 & S2 heard, RRR. No JVD, murmurs, rubs, gallops or clicks. No pedal edema.  Mild TTP of sternum.  Gastrointestinal system: Abdomen is nondistended, soft and nontender. No organomegaly or masses felt. Normal bowel sounds heard. Central nervous system: Alert and oriented. No focal neurological deficits. No TTP of spine.  Extremities: Symmetric 5 x 5 power. Skin: No rashes, lesions or ulcers Psychiatry: Judgement and insight appear normal. Mood & affect appropriate.     Data Reviewed: I have personally reviewed following labs and imaging studies  CBC:  Recent Labs Lab 02/01/17 1234 02/03/17 1551 02/04/17 0455  WBC 7.5 6.0 6.9  NEUTROABS  --  4.4  --   HGB 11.4* 11.2* 10.5*  HCT 34.6* 34.6* 32.2*  MCV 94.2 92.8 90.4  PLT 410.0* 424* 237   Basic Metabolic Panel:  Recent Labs Lab  02/01/17 1234 02/03/17 1551 02/04/17 0455  NA 137 138 138  K 3.5 3.4* 3.4*  CL 98 99* 98*  CO2 31 29 29   GLUCOSE 108* 110* 88  BUN 11 13 10   CREATININE 0.53 0.59 0.64  CALCIUM 8.5 8.5* 8.1*   GFR: Estimated Creatinine Clearance: 26.1 mL/min (by C-G formula based on SCr of 0.64 mg/dL). Liver Function Tests: No results for input(s): AST, ALT, ALKPHOS, BILITOT, PROT, ALBUMIN in the last 168 hours. No results for input(s): LIPASE, AMYLASE in the last 168 hours. No results for input(s): AMMONIA in the last 168 hours. Coagulation Profile: No results for input(s): INR, PROTIME in the last 168 hours. Cardiac Enzymes: No results for input(s): CKTOTAL, CKMB, CKMBINDEX, TROPONINI in the last 168 hours. BNP (last 3 results) No results for input(s): PROBNP in the last 8760 hours. HbA1C: No results for input(s): HGBA1C in the last 72 hours. CBG: No results for input(s): GLUCAP in the last 168 hours. Lipid Profile: No results for input(s): CHOL, HDL, LDLCALC, TRIG, CHOLHDL, LDLDIRECT in the last 72 hours. Thyroid Function Tests:  Recent Labs  02/01/17 1234  TSH 3.69   Anemia Panel: No results for input(s): VITAMINB12, FOLATE, FERRITIN, TIBC, IRON, RETICCTPCT in the last 72 hours. Sepsis Labs: No results for input(s): PROCALCITON, LATICACIDVEN in the last 168 hours.  No results found for this or any previous visit (from the past 240 hour(s)).       Radiology Studies: Ct Angio Chest Pe W/cm &/or Wo Cm  Result Date: 02/03/2017 CLINICAL DATA:  Shortness of breath for 1 week, elevated D-dimer. History of colon cancer, endometrial cancer, positive tuberculosis test. EXAM: CT ANGIOGRAPHY CHEST WITH CONTRAST TECHNIQUE: Multidetector CT imaging of the chest was performed using the standard protocol during bolus administration of intravenous contrast. Multiplanar CT image reconstructions and MIPs were obtained to evaluate the vascular anatomy. CONTRAST:  75 cc Isovue 370 COMPARISON:  Chest  radiograph February 01, 2017 and chest radiograph Oct 09, 2016 FINDINGS: CARDIOVASCULAR: Adequate contrast opacification of the pulmonary artery's. Main pulmonary artery is borderline enlarged at 3 cm. No pulmonary arterial filling defects to the level of the subsegmental branches. The heart is moderately enlarged. No pericardial effusion. Mild pulmonary vascular congestion. Mild coronary artery calcifications. No pericardial effusion. Thoracic aorta is normal course and caliber, mild calcific atherosclerosis. MEDIASTINUM/NODES: No lymphadenopathy by CT size criteria. LUNGS/PLEURA: Tracheobronchial tree is patent, no pneumothorax. Small RIGHT lower lobe consolidation. Bibasilar atelectasis. Tiny calcified granuloma LEFT lung apex. Heterogeneous lung attenuation. No pleural effusion. UPPER ABDOMEN: Included view of the abdomen is unremarkable. MUSCULOSKELETAL: Acute to subacute appearing displaced sternal fracture, new from May 2018. New subacute T5 severe compression fracture. Old mild T9, moderate T10 compression fractures. Severe RIGHT shoulder osteoarthrosis. Old RIGHT posterior rib fractures.  Broad levoscoliosis. Review of the MIP images confirms the above findings. IMPRESSION: 1. No acute pulmonary embolism. 2. Small RIGHT lower lobe consolidation concerning for pneumonia. 3. Moderate cardiomegaly and vascular congestion. Heterogeneous lung attenuation seen with pulmonary edema/small airway disease. 4. New acute to subacute appearing displaced sternal fracture. New, subacute severe T5 compression fracture. Aortic Atherosclerosis (ICD10-I70.0). Electronically Signed   By: Elon Alas M.D.   On: 02/03/2017 18:05        Scheduled Meds: . azithromycin  500 mg Oral q1800  . calcitonin (salmon)  1 spray Alternating Nares Daily  . calcium carbonate  1 tablet Oral BID  . cholecalciferol  1,000 Units Oral Daily  . furosemide  20 mg Intravenous Q12H  . guaiFENesin  600 mg Oral BID  . heparin  5,000  Units Subcutaneous Q8H  . levothyroxine  50 mcg Oral QAC breakfast  . lisinopril  20 mg Oral Daily  . multivitamin   Oral BID  . senna  1 tablet Oral BID  . sodium chloride flush  3 mL Intravenous Q12H  . vitamin C  500 mg Oral Daily  . Vitamin D (Ergocalciferol)  50,000 Units Oral Q7 days   Continuous Infusions: . sodium chloride    . cefTRIAXone (ROCEPHIN)  IV       LOS: 1 day    Time spent: 30 min    Fayrene Helper, MD [facility] [phone]  If 7PM-7AM, please contact night-coverage [email or web address] Password [facility] 02/04/2017, 10:46 AM

## 2017-02-04 NOTE — Progress Notes (Signed)
  Echocardiogram 2D Echocardiogram has been performed.  Darlina Sicilian M 02/04/2017, 1:24 PM

## 2017-02-04 NOTE — Progress Notes (Signed)
PHARMACIST - PHYSICIAN COMMUNICATION DR:   Florene Glen CONCERNING: Antibiotic IV to Oral Route Change Policy  RECOMMENDATION: This patient is receiving azithromycin by the intravenous route.  Based on criteria approved by the Pharmacy and Therapeutics Committee, the antibiotic(s) is/are being converted to the equivalent oral dose form(s).   DESCRIPTION: These criteria include:  Patient being treated for a respiratory tract infection, urinary tract infection, cellulitis or clostridium difficile associated diarrhea if on metronidazole  The patient is not neutropenic and does not exhibit a GI malabsorption state  The patient is eating (either orally or via tube) and/or has been taking other orally administered medications for a least 24 hours  The patient is improving clinically and has a Tmax < 100.5  If you have questions about this conversion, please contact the Pharmacy Department  []   [phone] )  [facility] []   [phone] )  [facility] []   [phone] )  [facility] []   [phone] )  [facility] [x]   [phone] )  [place], [place].D. [phone] 02/04/2017 7:52 AM

## 2017-02-04 NOTE — Evaluation (Signed)
Physical Therapy Evaluation Patient Details Name: Lori [name] MRN: [id number] DOB: 1919-04-01 Today's Date: 02/04/2017   History of Present Illness  83 y.o. female who presents to the Emergency Department complaining of sob, abnormal lab.  She presents to the emergency department by referral from her primary care provider for evaluation of shortness of breath.  She has a previous fall at home in May 2018 where she sustained L1-3 transverse process fxs.  Clinical Impression  Pt admitted with above diagnosis. Pt currently with functional limitations due to the deficits listed below (see PT Problem List). On eval, pt required min guard assist transfers and gait 120 feet with cane. HR varied widely during mobility ranging from 105-132. Pt will benefit from skilled PT to increase their independence and safety with mobility to allow discharge to the venue listed below.       Follow Up Recommendations Home health PT;Supervision/Assistance - 24 hour    Equipment Recommendations  None recommended by PT    Recommendations for Other Services       Precautions / Restrictions Precautions Precautions: Fall      Mobility  Bed Mobility Overal bed mobility: Modified Independent             General bed mobility comments: +rail, increased time and effort  Transfers Overall transfer level: Needs assistance Equipment used: Ambulation equipment used Transfers: Sit to/from Stand;Stand Pivot Transfers Sit to Stand: Min guard Stand pivot transfers: Min guard       General transfer comment: increased time to stabilize initial standing balance  Ambulation/Gait Ambulation/Gait assistance: Min guard Ambulation Distance (Feet): 120 Feet Assistive device: Straight cane Gait Pattern/deviations: Step-through pattern;Decreased stride length Gait velocity: decreased   General Gait Details: LOB x 1 requiring min assist to correct.  Stairs            Wheelchair Mobility    Modified  [place] (Stroke Patients Only)       Balance Overall balance assessment: Needs assistance Sitting-balance support: No upper extremity supported;Feet supported Sitting balance-Leahy Scale: Good     Standing balance support: Single extremity supported;During functional activity Standing balance-Leahy Scale: Fair                               Pertinent Vitals/Pain Pain Assessment: No/denies pain    Home Living Family/patient expects to be discharged to:: Private residence Living Arrangements: Children Available Help at Discharge: Family;Available 24 hours/day Type of Home: House Home Access: Stairs to enter Entrance Stairs-Rails: Left Entrance Stairs-Number of Steps: 2 Home Layout: One level Home Equipment: Cane - single point      Prior Function Level of Independence: Independent with assistive device(s)         Comments: ocassional use of cane     Hand Dominance   Dominant Hand: Right    Extremity/Trunk Assessment   Upper Extremity Assessment Upper Extremity Assessment: Generalized weakness    Lower Extremity Assessment Lower Extremity Assessment: Generalized weakness    Cervical / Trunk Assessment Cervical / Trunk Assessment: Kyphotic  Communication   Communication: HOH  Cognition Arousal/Alertness: Awake/alert Behavior During Therapy: WFL for tasks assessed/performed Overall Cognitive Status: Within Functional Limits for tasks assessed                                        General Comments  Exercises     Assessment/Plan    PT Assessment Patient needs continued PT services  PT Problem List Decreased strength;Decreased activity tolerance;Decreased balance;Decreased mobility;Cardiopulmonary status limiting activity       PT Treatment Interventions Gait training;Stair training;Functional mobility training;Therapeutic activities;Therapeutic exercise;Balance training;Patient/family education    PT Goals (Current  goals can be found in the Care Plan section)  Acute Rehab PT Goals Patient Stated Goal: home PT Goal Formulation: With patient/family Time For Goal Achievement: 02/11/17 Potential to Achieve Goals: Good    Frequency Min 3X/week   Barriers to discharge        Co-evaluation               AM-PAC PT "6 Clicks" Daily Activity  Outcome Measure Difficulty turning over in bed (including adjusting bedclothes, sheets and blankets)?: A Little Difficulty moving from lying on back to sitting on the side of the bed? : A Little Difficulty sitting down on and standing up from a chair with arms (e.g., wheelchair, bedside commode, etc,.)?: A Little Help needed moving to and from a bed to chair (including a wheelchair)?: None Help needed walking in hospital room?: A Little Help needed climbing 3-5 steps with a railing? : A Little 6 Click Score: 19    End of Session Equipment Utilized During Treatment: Gait belt Activity Tolerance: Patient tolerated treatment well Patient left: in bed;with bed alarm set;with call bell/phone within reach;with family/visitor present Nurse Communication: Mobility status PT Visit Diagnosis: Unsteadiness on feet (R26.81);History of falling (Z91.81);Muscle weakness (generalized) (M62.81)    Time: 2595-6387 PT Time Calculation (min) (ACUTE ONLY): 23 min   Charges:   PT Evaluation $PT Eval Moderate Complexity: 1 Mod PT Treatments $Gait Training: 8-22 mins   PT G Codes:        Lorrin Goodell, PT  Office # [phone] Pager [phone]   Lorriane Shire 02/04/2017, 2:04 PM

## 2017-02-04 NOTE — Progress Notes (Signed)
   02/04/17 1500  Clinical Encounter Type  Visited With Patient  Visit Type Initial  Referral From Patient;Nurse  Consult/Referral To Chaplain  Spiritual Encounters  Spiritual Needs Emotional;Prayer  Stress Factors  Patient Stress Factors Health changes   Following up on a spiritual care consult for prayer.  Patient was very receptive to the visit and shared some of her life story and faith.  Talked about  Health changes and living with one of the daughters. We shared prayer together the patient seemed comforted by the visit.  Possible discharge today or tomorrow.  Will follow as needed. [place]

## 2017-02-04 NOTE — Progress Notes (Signed)
CRITICAL VALUE ALERT  Critical Value:  Troponin 0.04  Date & Time Notied:  02/04/17  1512  Provider Notified: Florene Glen (on unit when value called)  Orders Received/Actions taken: None at this time

## 2017-02-05 ENCOUNTER — Encounter (HOSPITAL_COMMUNITY): Payer: Self-pay | Admitting: Cardiology

## 2017-02-05 DIAGNOSIS — I340 Nonrheumatic mitral (valve) insufficiency: Secondary | ICD-10-CM

## 2017-02-05 LAB — BASIC METABOLIC PANEL
ANION GAP: 9 (ref 5–15)
BUN: 11 mg/dL (ref 6–20)
CO2: 28 mmol/L (ref 22–32)
CREATININE: 0.55 mg/dL (ref 0.44–1.00)
Calcium: 8.2 mg/dL — ABNORMAL LOW (ref 8.9–10.3)
Chloride: 98 mmol/L — ABNORMAL LOW (ref 101–111)
GFR calc Af Amer: >60 mL/min (ref >60)
Glucose, Bld: 92 mg/dL (ref 65–99)
Potassium: 4.1 mmol/L (ref 3.5–5.1)
SODIUM: 135 mmol/L (ref 135–145)

## 2017-02-05 LAB — CBC
HCT: 30.8 % — ABNORMAL LOW (ref 36.0–46.0)
Hemoglobin: 10.4 g/dL — ABNORMAL LOW (ref 12.0–15.0)
MCH: 31 pg (ref 26.0–34.0)
MCHC: 33.8 g/dL (ref 30.0–36.0)
MCV: 91.9 fL (ref 78.0–100.0)
PLATELETS: 420 10*3/uL — AB (ref 150–400)
RBC: 3.35 MIL/uL — AB (ref 3.87–5.11)
RDW: 14.4 % (ref 11.5–15.5)
WBC: 7 10*3/uL (ref 4.0–10.5)

## 2017-02-05 LAB — TROPONIN I: TROPONIN I: 0.04 ng/mL — AB (ref <0.03)

## 2017-02-05 LAB — TSH: TSH: 3.818 u[IU]/mL (ref 0.350–4.500)

## 2017-02-05 MED ORDER — CEFDINIR 300 MG PO CAPS: 300.0000 mg | ORAL_CAPSULE | Freq: Every day | ORAL | 0 refills | Status: DC

## 2017-02-05 MED ORDER — PRO-STAT SUGAR FREE PO LIQD: 30.0000 mL | Freq: Three times a day (TID) | ORAL | Status: DC

## 2017-02-05 MED ORDER — PRO-STAT SUGAR FREE PO LIQD: 30.0000 mL | Freq: Three times a day (TID) | ORAL | 0 refills | Status: DC

## 2017-02-05 MED ORDER — FUROSEMIDE 20 MG PO TABS: 20.0000 mg | ORAL_TABLET | Freq: Every day | ORAL | 0 refills | Status: DC | PRN

## 2017-02-05 MED ORDER — AZITHROMYCIN 250 MG PO TABS: 250.0000 mg | ORAL_TABLET | Freq: Every day | ORAL | 0 refills | Status: AC

## 2017-02-05 NOTE — Progress Notes (Signed)
Pt and daughter at bedside selected [facility] for HHRNA/PT/CSW. Referral given to in house rep.

## 2017-02-05 NOTE — Evaluation (Signed)
Occupational Therapy Evaluation Patient Details Name: Lori [name] MRN: [id number] DOB: 28-Jan-1919 Today's Date: 02/05/2017    History of Present Illness 83 y.o. female who presents to the Emergency Department complaining of sob, abnormal lab.  She presents to the emergency department by referral from her primary care provider for evaluation of shortness of breath.  She has a previous fall at home in May 2018 where she sustained L1-3 transverse process fxs.   Clinical Impression   Pt admitted with shortness of breath. Pt currently with functional limitations due to the deficits listed below (see OT Problem List).  Pt will benefit from skilled OT to increase their safety and independence with ADL and functional mobility for ADL to facilitate discharge to venue listed below.      Follow Up Recommendations  Home health OT;Supervision/Assistance - 24 hour    Equipment Recommendations  3 in 1 bedside commode    Recommendations for Other Services       Precautions / Restrictions        Mobility Bed Mobility Overal bed mobility: Modified Independent             General bed mobility comments: +rail, increased time and effort  Transfers Overall transfer level: Needs assistance Equipment used: Ambulation equipment used Transfers: Sit to/from Stand;Stand Pivot Transfers Sit to Stand: Min assist Stand pivot transfers: Min assist            Balance Overall balance assessment: Needs assistance Sitting-balance support: No upper extremity supported;Feet supported Sitting balance-Leahy Scale: Good     Standing balance support: Single extremity supported;During functional activity Standing balance-Leahy Scale: Fair                             ADL either performed or assessed with clinical judgement   ADL Overall ADL's : Needs assistance/impaired Eating/Feeding: Set up;Sitting   Grooming: Minimal assistance;Standing   Upper Body Bathing: Set up;Sitting        Upper Body Dressing : Minimal assistance;Sitting   Lower Body Dressing: Moderate assistance;Sit to/from stand;Cueing for safety;Cueing for sequencing   Toilet Transfer: Moderate assistance;Ambulation;Minimal assistance   Toileting- Clothing Manipulation and Hygiene: Minimal assistance;Sit to/from stand;Cueing for safety;Cueing for sequencing;Moderate assistance               Vision Patient Visual Report: No change from baseline       Perception     Praxis      Pertinent Vitals/Pain Pain Assessment: No/denies pain     Hand Dominance Right   Extremity/Trunk Assessment Upper Extremity Assessment Upper Extremity Assessment: Generalized weakness       Cervical / Trunk Assessment Cervical / Trunk Assessment: Kyphotic   Communication Communication Communication: HOH   Cognition Arousal/Alertness: Awake/alert Behavior During Therapy: WFL for tasks assessed/performed Overall Cognitive Status: Within Functional Limits for tasks assessed                                     General Comments   pt has a cane in which she uses in an unsafe manner. Would benefit froma walker            Home Living Family/patient expects to be discharged to:: Private residence Living Arrangements: Children Available Help at Discharge: Family;Available 24 hours/day Type of Home: House Home Access: Stairs to enter [employer] of Steps: 2 Entrance Stairs-Rails: Left Home Layout: One  level     Bathroom Shower/Tub: Tub/shower unit;Walk-in shower   Bathroom Toilet: Standard     Home Equipment: Cane - single point          Prior Functioning/Environment Level of Independence: Independent with assistive device(s)        Comments: ocassional use of cane        OT Problem List: Decreased strength;Decreased activity tolerance;Impaired balance (sitting and/or standing);Decreased knowledge of use of DME or AE;Decreased safety awareness      OT  Treatment/Interventions: Self-care/ADL training;Patient/family education;DME and/or AE instruction    OT Goals(Current goals can be found in the care plan section) Acute Rehab OT Goals Patient Stated Goal: home OT Goal Formulation: With patient Time For Goal Achievement: 02/12/17 Potential to Achieve Goals: Good  OT Frequency: Min 2X/week   Barriers to D/C:            Co-evaluation              AM-PAC PT "6 Clicks" Daily Activity     Outcome Measure Help from another person eating meals?: None Help from another person taking care of personal grooming?: A Little Help from another person toileting, which includes using toliet, bedpan, or urinal?: A Lot Help from another person bathing (including washing, rinsing, drying)?: A Lot Help from another person to put on and taking off regular upper body clothing?: A Little Help from another person to put on and taking off regular lower body clothing?: A Lot 6 Click Score: 16   End of Session Equipment Utilized During Treatment: Rolling walker Nurse Communication: Mobility status  Activity Tolerance: Patient tolerated treatment well Patient left: in chair;with call bell/phone within reach;with family/visitor present  OT Visit Diagnosis: Unsteadiness on feet (R26.81);Muscle weakness (generalized) (M62.81)                Time: 1240-1305 OT Time Calculation (min): 25 min Charges:  OT General Charges $OT Visit: 1 Visit OT Evaluation $OT Eval Moderate Complexity: 1 Mod OT Treatments $Self Care/Home Management : 8-22 mins G-Codes:     Kari Baars, OT [phone]  Payton Mccallum D 02/05/2017, 1:39 PM

## 2017-02-05 NOTE — Discharge Summary (Signed)
Physician Discharge Summary  Lori [name] HFW:[id number] DOB: 1918-08-07 DOA: 02/03/2017  PCP: Lori Kern, DO  Admit date: 02/03/2017 Discharge date: 02/05/2017  Time spent: 30 minutes  Recommendations for Outpatient Follow-up:  1. Follow up outpatient CBC/BMP 2. Follow up with cardiology for palpitations (considering bb?)  3. Follow up with CT surgery regarding fractured sternum 4. Continue calcium/vit D (sounds like she's already had bisphosphonate therapy) for compression fx 5. Follow up blood cx  Discharge Diagnoses:  Principal Problem:   CHF (congestive heart failure)/Unspecified Active Problems:   Essential hypertension, benign   Hypothyroidism   Community acquired pneumonia/Rt LL   Vertebral fracture, osteoporotic/T5   Sternal fracture   Discharge Condition: stable  Diet recommendation: heart healthy  Filed Weights   02/04/17 0016 02/04/17 0529 02/05/17 0534  Weight: 40.5 kg (89 lb 4.6 oz) 40.2 kg (88 lb 10 oz) 40.3 kg (88 lb 13.5 oz)    History of present illness:   Hospital Course:  1) CHF Exacerbation: elevated proBNP, CT findings with concern for pulm edema.  LEE this morning improved.  [ ]  lasix 20 mg PO daily, discharge with prn lasix [ ]  echo -> EF 50% with hypokinesis, grade 2 diaystolic dysfunction.  MR with possible flail of anterior leaflet.              - cardiology c/s pending, recommended only medical management given age (no clear symptoms related to it) [ ]  serial troponins, mildly elevated.  EKGs unchanged, notable for sinus tach, q's in inferior leads.  [ ]  normal TSH    2)Possible PNA- radiologically CTA chest suggest pneumonia Dc on cefdinir/azithro  3) Palpitations: she notes palpitations and there's intermittent tachycardia on tele.  Cards planning to follow in clinic and determine whether beta blocker necessary.  4)Sternal Fracture ANDT5 fracture- patient is not very symptomatic at this time,consider orthospine consultif patient  becomes more symptomatically, vitamin D, calcium carbonate and fortical nose spray  [ ]  discussed with CT surgery the sternal fracture, they'll arrange outpatient follow up  Procedures:  Echo (see official report) - EF 50%, mild diffuse hypokinesis.  Mod diastolic dysfunction. Normal RV size and systolic function.  Severe LAE.  Mod to severe MR with possible flail segment of anterior leaflet. (i.e. Studies not automatically included, echos, thoracentesis, etc; not x-rays)  Bilateral LE [place] without evidence of DVT  Consultations:  cardiology  Discharge Exam: Vitals:   02/05/17 0532 02/05/17 1426  BP: 131/89 132/70  Pulse: 97 75  Resp: 18   Temp: 98.2 F (36.8 C) 98 F (36.7 C)  SpO2: 98% 96%    General: No acute distress.  Pleasant 83 yo woman. Cardiovascular: Heart sounds show Lori [name] regular rate, and rhythm. No gallops or rubs. No murmurs. No JVD. Lungs: Clear to auscultation bilaterally with good air movement. No rales, rhonchi or wheezes. Abdomen: Soft, nontender, nondistended with normal active bowel sounds. No masses. No hepatosplenomegaly. Neurological: Alert and oriented. Moves all extremities 4 with equal strength. Cranial nerves II through XII grossly intact. Skin: Warm and dry. No rashes or lesions. Extremities: No clubbing or cyanosis. No edema. Pedal pulses 2+. Psychiatric: Mood and affect are normal. Insight and judgment are appropriate.  Discharge Instructions   Discharge Instructions    Call MD for:  difficulty breathing, headache or visual disturbances    Complete by:  As directed    Call MD for:  extreme fatigue    Complete by:  As directed    Call MD for:  hives    Complete by:  As directed    Call MD for:  persistant dizziness or light-headedness    Complete by:  As directed    Call MD for:  persistant nausea and vomiting    Complete by:  As directed    Call MD for:  severe uncontrolled pain    Complete by:  As directed    Call MD for:  temperature  >100.4    Complete by:  As directed    Diet - low sodium heart healthy    Complete by:  As directed    Diet - low sodium heart healthy    Complete by:  As directed    Discharge instructions    Complete by:  As directed    You were seen for shortness of breath.  We treated you for pneumonia and Lori [name] heart failure exacerbation with antibiotics and lasix.  We will send you home with antibiotics and lasix to use only as needed.  You were seen by cardiology and can follow up with them as an outpatient.  You had new compression fractures.  Continue your vitamin D, you should get 1200 mg calcium daily in your diet (if you don't, start calcium supplements).  For the fracture of your sternum, please follow up with CT surgery, they'll repeat imaging.   Increase activity slowly    Complete by:  As directed    Increase activity slowly    Complete by:  As directed      Current Discharge Medication List    START taking these medications   Details  Amino Acids-Protein Hydrolys (FEEDING SUPPLEMENT, PRO-STAT SUGAR FREE 64,) LIQD Take 30 mLs by mouth 3 (three) times daily. Qty: 900 mL, Refills: 0    azithromycin (ZITHROMAX Z-PAK) 250 MG tablet Take 1 tablet (250 mg total) by mouth daily. Qty: 3 tablet, Refills: 0    cefdinir (OMNICEF) 300 MG capsule Take 1 capsule (300 mg total) by mouth daily. Qty: 3 capsule, Refills: 0    furosemide (LASIX) 20 MG tablet Take 1 tablet (20 mg total) by mouth daily as needed. For swelling or shortness of breath Qty: 30 tablet, Refills: 0      CONTINUE these medications which have NOT CHANGED   Details  acetaminophen (TYLENOL) 500 MG tablet Take 500 mg by mouth as needed.    Cholecalciferol (VITAMIN D PO) Take 1 capsule by mouth daily.     levothyroxine (SYNTHROID, LEVOTHROID) 50 MCG tablet TAKE 2 TABLETS ON MONDAY AND FRIDAY. TAKE 1 TABLET ALL OTHER DAYS Qty: 144 tablet, Refills: 1    lisinopril (PRINIVIL,ZESTRIL) 20 MG tablet TAKE 1 TABLET (20 MG TOTAL) BY MOUTH  DAILY. Qty: 90 tablet, Refills: 1    Multiple Vitamins-Minerals (EYE VITAMINS PO) Take 1 tablet by mouth 2 (two) times daily.     Riboflavin (VITAMIN B-2 PO) Take 1 tablet by mouth daily.     vitamin C (ASCORBIC ACID) 500 MG tablet Take 500 mg by mouth daily.    triamcinolone cream (KENALOG) 0.1 % Apply 1 application topically 2 (two) times daily. Qty: 30 g, Refills: 0       Allergies  Allergen Reactions  . Bactrim [Sulfamethoxazole-Trimethoprim] Itching    Blisters   . Clindamycin/Lincomycin Other (See Comments)    GI upset  . Macrobid [Nitrofurantoin Macrocrystal] Itching  . Penicillins Itching    Has patient had Nishaan Stanke PCN reaction causing immediate rash, facial/tongue/throat swelling, SOB or lightheadedness with hypotension Unknown Has patient had Austen Wygant  PCN reaction causing severe rash involving mucus membranes or skin necrosis: Unknown Has patient had Illiana Losurdo PCN reaction that required hospitalization: Unknown Has patient had Icelynn Onken PCN reaction occurring within the last 10 years:Unknown If all of the above answers are "NO", then may proceed with Cephalosporin use.   . Sulfa Antibiotics Itching  . Zofran [Ondansetron Hcl] Hives   Follow-up Information    Lori Kern, DO Follow up.   Specialty:  Family Medicine Contact information: [place] [address] [phone]        Minus Breeding, MD Follow up.   Specialty:  Cardiology Contact information: [address] [place] [address] [phone]            The results of significant diagnostics from this hospitalization (including imaging, microbiology, ancillary and laboratory) are listed below for reference.    Significant Diagnostic Studies: Dg Chest 2 View  Result Date: 02/01/2017 CLINICAL DATA:  Short of breath EXAM: CHEST  2 VIEW COMPARISON:  10/09/2016 FINDINGS: The heart is moderately enlarged. Lungs are hyperaerated. Bibasilar scarring is not significantly changed. Normal  pulmonary vascularity. Aortic knob remains prominent. No pneumothorax. No pleural effusion. Stable thoracic spine compared with prior lateral radiographs. IMPRESSION: No active cardiopulmonary disease. Electronically Signed   By: Marybelle Killings M.D.   On: 02/01/2017 15:29   Ct Angio Chest Pe W/cm &/or Wo Cm  Result Date: 02/03/2017 CLINICAL DATA:  Shortness of breath for 1 week, elevated D-dimer. History of colon cancer, endometrial cancer, positive tuberculosis test. EXAM: CT ANGIOGRAPHY CHEST WITH CONTRAST TECHNIQUE: Multidetector CT imaging of the chest was performed using the standard protocol during bolus administration of intravenous contrast. Multiplanar CT image reconstructions and MIPs were obtained to evaluate the vascular anatomy. CONTRAST:  75 cc Isovue 370 COMPARISON:  Chest radiograph February 01, 2017 and chest radiograph Oct 09, 2016 FINDINGS: CARDIOVASCULAR: Adequate contrast opacification of the pulmonary artery's. Main pulmonary artery is borderline enlarged at 3 cm. No pulmonary arterial filling defects to the level of the subsegmental branches. The heart is moderately enlarged. No pericardial effusion. Mild pulmonary vascular congestion. Mild coronary artery calcifications. No pericardial effusion. Thoracic aorta is normal course and caliber, mild calcific atherosclerosis. MEDIASTINUM/NODES: No lymphadenopathy by CT size criteria. LUNGS/PLEURA: Tracheobronchial tree is patent, no pneumothorax. Small RIGHT lower lobe consolidation. Bibasilar atelectasis. Tiny calcified granuloma LEFT lung apex. Heterogeneous lung attenuation. No pleural effusion. UPPER ABDOMEN: Included view of the abdomen is unremarkable. MUSCULOSKELETAL: Acute to subacute appearing displaced sternal fracture, new from May 2018. New subacute T5 severe compression fracture. Old mild T9, moderate T10 compression fractures. Severe RIGHT shoulder osteoarthrosis. Old RIGHT posterior rib fractures. Broad levoscoliosis. Review of  the MIP images confirms the above findings. IMPRESSION: 1. No acute pulmonary embolism. 2. Small RIGHT lower lobe consolidation concerning for pneumonia. 3. Moderate cardiomegaly and vascular congestion. Heterogeneous lung attenuation seen with pulmonary edema/small airway disease. 4. New acute to subacute appearing displaced sternal fracture. New, subacute severe T5 compression fracture. Aortic Atherosclerosis (ICD10-I70.0). Electronically Signed   By: Elon Alas M.D.   On: 02/03/2017 18:05    Microbiology: Recent Results (from the past 240 hour(s))  Culture, blood (Routine X 2) w Reflex to ID Panel     Status: None (Preliminary result)   Collection Time: 02/03/17  7:30 PM  Result Value Ref Range Status   Specimen Description BLOOD RIGHT HAND  Final   Special Requests   Final    BOTTLES DRAWN AEROBIC AND ANAEROBIC Blood Culture  adequate volume   Culture   Final    NO GROWTH 1 DAY Performed at [facility], [place] [address] [place], [place] [address]    Report Status PENDING  Incomplete  Culture, blood (Routine X 2) w Reflex to ID Panel     Status: None (Preliminary result)   Collection Time: 02/03/17  7:40 PM  Result Value Ref Range Status   Specimen Description BLOOD RIGHT ANTECUBITAL  Final   Special Requests   Final    BOTTLES DRAWN AEROBIC AND ANAEROBIC Blood Culture adequate volume   Culture   Final    NO GROWTH 1 DAY Performed at [facility], [place] [address] [place], [place] [address]    Report Status PENDING  Incomplete     Labs: Basic Metabolic Panel:  Recent Labs Lab 02/01/17 1234 02/03/17 1551 02/04/17 0455 02/05/17 0448  NA 137 138 138 135  K 3.5 3.4* 3.4* 4.1  CL 98 99* 98* 98*  CO2 31 29 29 28   GLUCOSE 108* 110* 88 92  BUN 11 13 10 11   CREATININE 0.53 0.59 0.64 0.55  CALCIUM 8.5 8.5* 8.1* 8.2*   Liver Function Tests: No results for input(s): AST, ALT, ALKPHOS, BILITOT, PROT, ALBUMIN in the last 168 hours. No results for input(s):  LIPASE, AMYLASE in the last 168 hours. No results for input(s): AMMONIA in the last 168 hours. CBC:  Recent Labs Lab 02/01/17 1234 02/03/17 1551 02/04/17 0455 02/05/17 0448  WBC 7.5 6.0 6.9 7.0  NEUTROABS  --  4.4  --   --   HGB 11.4* 11.2* 10.5* 10.4*  HCT 34.6* 34.6* 32.2* 30.8*  MCV 94.2 92.8 90.4 91.9  PLT 410.0* 424* 393 420*   Cardiac Enzymes:  Recent Labs Lab 02/04/17 1402 02/04/17 1750 02/04/17 2349  TROPONINI 0.04* 0.05* 0.04*   BNP: BNP (last 3 results)  Recent Labs  02/03/17 1551  BNP 391.6*    ProBNP (last 3 results) No results for input(s): PROBNP in the last 8760 hours.  CBG: No results for input(s): GLUCAP in the last 168 hours.     Signed:  Fayrene Helper MD.  [facility] 02/05/2017, 2:46 PM

## 2017-02-05 NOTE — Consult Note (Signed)
Cardiology Consultation:   Patient ID: Lori [name]; [id number]; September 07, 1918   Admit date: 02/03/2017 Date of Consult: 02/05/2017  Primary Care Provider: Lucretia Kern, DO Primary Cardiologist: New Primary Electrophysiologist:  NA   Patient Profile:   Lori [name] is a 83 y.o. female with a hx of CVA, macular degeneration, HTN, hypothyroid, colon cancer and endometrial cancer who is being seen today for the evaluation of abnormal echo with mod to severe MR at the request of Dr. Cephus Slater.   History of Present Illness:   Lori [name]  a hx of CVA, macular degeneration, HTN, colon cancer and endometrial cancer admitted to [facility] 02/03/17 with SOB and elevated DDimer at PCP office and sent to ER.  No prior echo noted in system.   CTA was done in ER no PE, but small Rt lung LL consolidation possible PNA.  Moderate cardiomegaly and vascular congestion  Heterogeneous lung attenuation seen with pulmonary edema/small airway disease.  Also new acute to subacute appearing displaced sternal fracture, new subacute severe T5 compression fx.   Pt given IV lasix 20 mg in ER and then 20 mg po daily .  Also started on abx.  Today doing well and Echo was done with  EF 50%, mild focal basal hypertrophy of the septum. Diffuse hypokinesis,  G2DD, Mitral valve with mildly calcified annulus, mildly calcified leaflets possible small flail segment of the ant. Leaflet with posteriorly directed MR.  Moderate to severe.  LA severely dilated. ? TEE   Venous dopplers neg for DVT.    EKG:  The EKG was personally reviewed and demonstrates:  SR with LAFB and freq PVCs  And PACs. Telemetry:  Telemetry was personally reviewed and demonstrates:  SR to ST to 140 , also WCT 3-4 beats,   LABS troponin 0.04 to 0.05  BNP 391 on admit.  TSH 3.818  K+ 3.4 on admit now 4.1 Cr 0.55, Hgb 10.4 and WBC 7  Currently no chest pain, Her SOB had increased over the last few weeks, she would prop up with pillow behind her and pillow in  front.  Currently feeling better and she would like to go home.  She lives with her daughter but does her own ADLs, her daughter works during the day.  No edema. She feels racing HR at times.  "Just one of those things"     Past Medical History:  Diagnosis Date  . Colon cancer ([place]) 2009   s/p colon resection  . Endometrial cancer ([place]) 1989   endometrial s/p hysterectomy, chemo and radiation  . Hearing loss   . History of blood transfusion   . Hypertension   . Macular degeneration   . Migraines   . Pneumonia   . Positive TB test   . Stroke [facility]) 2004   ? TIA, brief episode of speech issues - evaluate [place]    Past Surgical History:  Procedure Laterality Date  . ABDOMINAL HYSTERECTOMY  1989  . APPENDECTOMY    . COLON RESECTION  08/27/2007  . GALLBLADDER SURGERY  1970  . SUBDURAL HEMATOMA EVACUATION VIA CRANIOTOMY  2002  . TONSILLECTOMY AND ADENOIDECTOMY  1943     Home Medications:  Prior to Admission medications   Medication Sig Start Date End Date Taking? Authorizing Provider  acetaminophen (TYLENOL) 500 MG tablet Take 500 mg by mouth as needed.   Yes [provider]  Cholecalciferol (VITAMIN D PO) Take 1 capsule by mouth daily.    Yes [provider]  levothyroxine (SYNTHROID, LEVOTHROID) 50 MCG tablet TAKE 2 TABLETS ON MONDAY AND FRIDAY. TAKE 1 TABLET ALL OTHER DAYS 11/26/16  Yes Colin Benton R, DO  lisinopril (PRINIVIL,ZESTRIL) 20 MG tablet TAKE 1 TABLET (20 MG TOTAL) BY MOUTH DAILY. 12/13/16  Yes Lucretia Kern, DO  Multiple Vitamins-Minerals (EYE VITAMINS PO) Take 1 tablet by mouth 2 (two) times daily.    Yes [provider]  Riboflavin (VITAMIN B-2 PO) Take 1 tablet by mouth daily.    Yes [provider]  vitamin C (ASCORBIC ACID) 500 MG tablet Take 500 mg by mouth daily.   Yes [provider]  triamcinolone cream (KENALOG) 0.1 % Apply 1 application topically 2 (two) times daily. Patient not taking: Reported on 02/03/2017 11/08/16    Lucretia Kern, DO    Inpatient Medications: Scheduled Meds: . azithromycin  500 mg Oral q1800  . calcitonin (salmon)  1 spray Alternating Nares Daily  . calcium carbonate  1 tablet Oral BID  . cholecalciferol  1,000 Units Oral Daily  . furosemide  20 mg Oral Daily  . guaiFENesin  600 mg Oral BID  . heparin  5,000 Units Subcutaneous Q8H  . levothyroxine  50 mcg Oral QAC breakfast  . lisinopril  20 mg Oral Daily  . multivitamin   Oral BID  . senna  1 tablet Oral BID  . sodium chloride flush  3 mL Intravenous Q12H  . vitamin C  500 mg Oral Daily  . Vitamin D (Ergocalciferol)  50,000 Units Oral Q7 days   Continuous Infusions: . sodium chloride    . cefTRIAXone (ROCEPHIN)  IV Stopped (02/04/17 2035)   PRN Meds: sodium chloride, acetaminophen **OR** acetaminophen, albuterol, polyethylene glycol, sodium chloride flush, traZODone  Allergies:    Allergies  Allergen Reactions  . Bactrim [Sulfamethoxazole-Trimethoprim] Itching    Blisters   . Clindamycin/Lincomycin Other (See Comments)    GI upset  . Macrobid [Nitrofurantoin Macrocrystal] Itching  . Penicillins Itching    Has patient had a PCN reaction causing immediate rash, facial/tongue/throat swelling, SOB or lightheadedness with hypotension Unknown Has patient had a PCN reaction causing severe rash involving mucus membranes or skin necrosis: Unknown Has patient had a PCN reaction that required hospitalization: Unknown Has patient had a PCN reaction occurring within the last 10 years:Unknown If all of the above answers are "NO", then may proceed with Cephalosporin use.   . Sulfa Antibiotics Itching  . Zofran [Ondansetron Hcl] Hives    Social History:   Social History   Social History  . Marital status: Widowed    Spouse name: N/A  . Number of children: 2  . Years of education: N/A   Occupational History  . retired    Social History Main Topics  . Smoking status: Never Smoker  . Smokeless tobacco: Never Used  .  Alcohol use Yes     Comment: occ glass of wine   . Drug use: No  . Sexual activity: Not on file   Other Topics Concern  . 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 Arthritis Mother   . Heart disease Father   . Hyperlipidemia  Father   . Hypertension Father   . Diabetes Sister   . Crohn's disease Daughter      ROS:  Please see the history of present illness.  ROS  General:no colds or fevers, no weight changes Skin:no rashes or ulcers HEENT:no blurred vision, losing her eye sight to macular degeneration, no congestion CV:see HPI PUL:see HPI GI:no diarrhea constipation or melena, no indigestion GU:no hematuria, no dysuria MS:no joint pain, no claudication Neuro:no syncope, no lightheadedness Endo:no diabetes, + thyroid disease      Physical Exam/Data:   Vitals:   02/04/17 1317 02/05/17 0114 02/05/17 0532 02/05/17 0534  BP: 117/69 115/87 131/89   Pulse: 68 95 97   Resp: 16 18 18    Temp: 98.2 F (36.8 C) 98.8 F (37.1 C) 98.2 F (36.8 C)   TempSrc: Oral Oral Oral   SpO2: 96% 95% 98%   Weight:    88 lb 13.5 oz (40.3 kg)  Height:        Intake/Output Summary (Last 24 hours) at 02/05/17 1156 Last data filed at 02/05/17 0600  Gross per 24 hour  Intake              170 ml  Output                0 ml  Net              170 ml   Filed Weights   02/04/17 0016 02/04/17 0529 02/05/17 0534  Weight: 89 lb 4.6 oz (40.5 kg) 88 lb 10 oz (40.2 kg) 88 lb 13.5 oz (40.3 kg)   Body mass index is 17.35 kg/m.  General:  Well nourished, well developed, in no acute distress HEENT: normal Lymph: no adenopathy Neck: no JVD Endocrine:  No thryomegaly Vascular: No carotid bruits; 1+ pedal pulses bil. Cardiac:  normal S1, S2; RRR;  2/6 murmur  no gallup or rub Lungs:  clear to auscultation bilaterally, no wheezing, rhonchi few fine rales in bases Abd: soft, nontender, no hepatomegaly  Ext: no edema Musculoskeletal:  No deformities, BUE and BLE strength normal and equal Skin: warm and dry  Neuro:  Alert and oriented X 3 MAE follows commands + facial symmetry Psych:  Normal affect    Relevant CV Studies: Echo 02/04/17  Study Conclusions  - Left ventricle: The cavity size was normal. There was mild focal   basal hypertrophy of the septum. The estimated ejection fraction   was 50%. Diffuse hypokinesis. Features are consistent with a   pseudonormal left ventricular filling pattern, with concomitant   abnormal relaxation and increased filling pressure (grade 2   diastolic dysfunction). E/medial e&' > 15 suggesting LVEDP > 20   mmHg. - Aortic valve: There was no stenosis. - Mitral valve: Mildly calcified annulus. Mildly calcified leaflets   . Possible small flail segment of the anterior leaflet with   posteriorly-directed mitral regurgitation. There was moderate to   severe regurgitation. - Left atrium: The atrium was severely dilated. - Right ventricle: The cavity size was normal. Systolic function   was normal. - Tricuspid valve: Peak RV-RA gradient (S): 20 mm Hg. - Pulmonary arteries: PA peak pressure: 28 mm Hg (S). - Systemic veins: IVC measured 1.9 cm with < 50% respirophasic   variation, suggesting RA pressure 8 mmHg.  Impressions:  - Normal LV size with mild focal basal septal hypertrophy. EF 50%,   mild diffuse hypokinesis. Moderate diastolic dysfunction. Normal   RV size and systolic function. Severe LAE. Moderate  to severe   mitral regurgitation with possible flail segment of anterior   leaflet (posteriorly directed jet). Would consider TEE to further   evaluate.  Laboratory Data:  Chemistry Recent Labs Lab 02/03/17 1551 02/04/17 0455 02/05/17 0448  NA 138 138 135  K 3.4* 3.4* 4.1  CL 99* 98* 98*   CO2 29 29 28   GLUCOSE 110* 88 92  BUN 13 10 11   CREATININE 0.59 0.64 0.55  CALCIUM 8.5* 8.1* 8.2*  GFRNONAA >60 >60 >60  GFRAA >60 >60 >60  ANIONGAP 10 11 9     No results for input(s): PROT, ALBUMIN, AST, ALT, ALKPHOS, BILITOT in the last 168 hours. Hematology Recent Labs Lab 02/03/17 1551 02/04/17 0455 02/05/17 0448  WBC 6.0 6.9 7.0  RBC 3.73* 3.56* 3.35*  HGB 11.2* 10.5* 10.4*  HCT 34.6* 32.2* 30.8*  MCV 92.8 90.4 91.9  MCH 30.0 29.5 31.0  MCHC 32.4 32.6 33.8  RDW 14.3 14.1 14.4  PLT 424* 393 420*   Cardiac Enzymes Recent Labs Lab 02/04/17 1402 02/04/17 1750 02/04/17 2349  TROPONINI 0.04* 0.05* 0.04*    Recent Labs Lab 02/03/17 1703  TROPIPOC 0.04    BNP Recent Labs Lab 02/03/17 1551  BNP 391.6*    DDimer  Recent Labs Lab 02/01/17 1234  DDIMER 3.49*    Radiology/Studies:  Dg Chest 2 View  Result Date: 02/01/2017 CLINICAL DATA:  Short of breath EXAM: CHEST  2 VIEW COMPARISON:  10/09/2016 FINDINGS: The heart is moderately enlarged. Lungs are hyperaerated. Bibasilar scarring is not significantly changed. Normal pulmonary vascularity. Aortic knob remains prominent. No pneumothorax. No pleural effusion. Stable thoracic spine compared with prior lateral radiographs. IMPRESSION: No active cardiopulmonary disease. Electronically Signed   By: Marybelle Killings M.D.   On: 02/01/2017 15:29   Ct Angio Chest Pe W/cm &/or Wo Cm  Result Date: 02/03/2017 CLINICAL DATA:  Shortness of breath for 1 week, elevated D-dimer. History of colon cancer, endometrial cancer, positive tuberculosis test. EXAM: CT ANGIOGRAPHY CHEST WITH CONTRAST TECHNIQUE: Multidetector CT imaging of the chest was performed using the standard protocol during bolus administration of intravenous contrast. Multiplanar CT image reconstructions and MIPs were obtained to evaluate the vascular anatomy. CONTRAST:  75 cc Isovue 370 COMPARISON:  Chest radiograph February 01, 2017 and chest radiograph Oct 09, 2016  FINDINGS: CARDIOVASCULAR: Adequate contrast opacification of the pulmonary artery's. Main pulmonary artery is borderline enlarged at 3 cm. No pulmonary arterial filling defects to the level of the subsegmental branches. The heart is moderately enlarged. No pericardial effusion. Mild pulmonary vascular congestion. Mild coronary artery calcifications. No pericardial effusion. Thoracic aorta is normal course and caliber, mild calcific atherosclerosis. MEDIASTINUM/NODES: No lymphadenopathy by CT size criteria. LUNGS/PLEURA: Tracheobronchial tree is patent, no pneumothorax. Small RIGHT lower lobe consolidation. Bibasilar atelectasis. Tiny calcified granuloma LEFT lung apex. Heterogeneous lung attenuation. No pleural effusion. UPPER ABDOMEN: Included view of the abdomen is unremarkable. MUSCULOSKELETAL: Acute to subacute appearing displaced sternal fracture, new from May 2018. New subacute T5 severe compression fracture. Old mild T9, moderate T10 compression fractures. Severe RIGHT shoulder osteoarthrosis. Old RIGHT posterior rib fractures. Broad levoscoliosis. Review of the MIP images confirms the above findings. IMPRESSION: 1. No acute pulmonary embolism. 2. Small RIGHT lower lobe consolidation concerning for pneumonia. 3. Moderate cardiomegaly and vascular congestion. Heterogeneous lung attenuation seen with pulmonary edema/small airway disease. 4. New acute to subacute appearing displaced sternal fracture. New, subacute severe T5 compression fracture. Aortic Atherosclerosis (ICD10-I70.0). Electronically Signed   By: Thana Farr.D.  On: 02/03/2017 18:05    Assessment and Plan:   1.  SOB on admit with PNA and now with mod to severe MR.  Agree with lasix.  2.   HTN  Controlled on ACE   3.   Mod to severe MR.   4.   Episodic tachycardia ? Add low dose BB vs dilt   For questions or updates, please contact [place] Please consult [email or web address] for contact info under Cardiology/STEMI.    Signed, Cecilie Kicks, NP  02/05/2017 11:56 AM   History and all data above reviewed.  Patient examined.  I agree with the findings as above.  The patient exam reveals COR:  Regular with ectopy.  Systolic ejection murmur at the apex, non holosystolic, heard in the axilla  ,  Lungs: Clear  ,  Abd: Positive bowel sounds, no rebound no guarding, Ext No edema  .  All available labs, radiology testing, previous records reviewed. Agree with documented assessment and plan. MR:  I have reviewed the echo images myself.  This is moderately severe.  I would suggest however, that this needs to be managed only medically given her advanced age.  She has no clear symptoms related to this.  I do not think that change in therapy is indicated.  Low dose Lasix would be reasonable as needed for SOB or edema.  I would not be inclined to make this a daily discharge med.  Arrhythmia:  She does have palpitations.  We can follow her in the clinic and decide in the future whether she needs a beta blocker.  I discussed this with the patient and her daughter and we agree to try to be conservative to start with and that her symptoms are not severe enough at this point to justify daily beta blocker.      Jeneen Rinks Leslea Vowles  12:59 PM  02/05/2017

## 2017-02-05 NOTE — Consult Note (Addendum)
   [facility] CM Inpatient Consult   02/05/2017  Lori [name] 1919/04/04 [id number]   Referral received for [facility] Care Management services.   Spoke with Lori [name] and daughter, Lori [name], to discuss [place] Management program services.  Lori [name] is agreeable and [facility] Care Management written consent obtained. [facility] Care Management packet and contact information.  Explained [facility] Care Management services will not interfere or replace services provided by home health.   Lori [name] lives with her daughter, Lori [name]. Denies having concerns with medications or with transportation. However, both daughter and patient endorse that disease and symptom management for CHF will be appreciated.  Lori [name] lives with daughter Lori [name] [phone].  Gets prescriptions filled at [facility] on [address].  Confirmed Primary Care MD is Lori [name].  Daughter states she intends on making a follow up appointment with Lori [name].  Explained that [place] Management will not interfere or replace services provided by home health.     Marthenia Rolling, MSN-Ed, RN,BSN [facility] Liaison [phone]

## 2017-02-05 NOTE — Progress Notes (Signed)
Initial Nutrition Assessment  DOCUMENTATION CODES:   Severe malnutrition in context of chronic illness  INTERVENTION:  Pro-stat TID, each supplement provides 100 calories and 15 grams protein   Promoted adequate calorie and protein consumption  Recommend liberalization of diet given pt's age and malnutrition status  NUTRITION DIAGNOSIS:   Malnutrition (severe) related to chronic illness (CHF, unspecified) as evidenced by severe depletion of body fat, severe depletion of muscle mass, percent weight loss (22% in 6 months).  GOAL:   Patient will meet greater than or equal to 90% of their needs  MONITOR:   I & O's, Weight trends, PO intake, Supplement acceptance  REASON FOR ASSESSMENT:   Malnutrition Screening Tool    ASSESSMENT:   Pt with PMH of HTN, hypothyroidism, macular degeneration, and hx of colon (2009 s/p colon resection) and endometrial (1989 s/p hysterectomy, chemo, and radiation) cancer presents with SOB, possible CHF exacerbation or PNA  Spoke with pt and pt's daughter at bedside. Pt fairly SOB after using the bathroom at time of visit. Pt lives with daughter and daughter prepares most of patients meals.   Pt's daughter reports poor intake for pt. Reporting she consumes 2 eggs and 1 piece of toast for breakfast, 1/2 of a sandwich (which she may not finish) for lunch, and maybe one more small snack throughout the day. Pt's daughter reports she does not like nutritional supplements and reports she is less likely to consume sweets than she once did. No meal completion percentages have been recorded this admission given pt's NPO status. Pt's diet just advanced to heart healthy. Discussed possible ways to increase protein and calories in pt's at home diet to daughter.    Pt's daughter reports recent weight loss for pt. Pt states "I would like to put some weight back on". Reported UBW for pt was 106 lbs in May. Per chart review pt has continuously trended downwards in weight  over the past ~6 months. Per chart review pt has lost 25 lbs in the past 6 months, 22%, significant for time frame.  Labs reviewed Medications reviewed; calcium carbonate, vitamin D, Lasix, Senokot, vitamin C, PRN Miralax and Zofran   Nutrition focused physical exam completed. Findings include severe fat depletion, severe muscle depletion and no edema.   Diet Order:  Diet Heart Room service appropriate? Yes; Fluid consistency: Thin Diet NPO time specified  Skin:  Reviewed, no issues  Last BM:  02/04/17  Height:   Ht Readings from Last 1 Encounters:  02/04/17 5' (1.524 m)    Weight:   Wt Readings from Last 1 Encounters:  02/05/17 88 lb 13.5 oz (40.3 kg)    Ideal Body Weight:  45.5 kg  BMI:  Body mass index is 17.35 kg/m.  Estimated Nutritional Needs:   Kcal:  1210-1410  Protein:  55-70 grams  Fluid:  >/= 1.2 L/d  EDUCATION NEEDS:   Education needs addressed  Parks Ranger, MS, RDN, LDN 02/05/2017 1:18 PM

## 2017-02-06 ENCOUNTER — Telehealth: Payer: Self-pay | Admitting: Cardiology

## 2017-02-06 ENCOUNTER — Other Ambulatory Visit: Payer: Self-pay

## 2017-02-06 ENCOUNTER — Telehealth: Payer: Self-pay

## 2017-02-06 NOTE — Telephone Encounter (Signed)
Admit date: 02/03/2017 Discharge date: 02/05/2017 Time spent: 30 minutes Recommendations for Outpatient Follow-up:  1. Follow up outpatient CBC/BMP 2. Follow up with cardiology for palpitations (considering bb?)  3. Follow up with CT surgery regarding fractured sternum 4. Continue calcium/vit D (sounds like she's already had bisphosphonate therapy) for compression fx 5. Follow up blood cx Discharge Diagnoses:  Principal Problem:   CHF (congestive heart failure)/Unspecified Active Problems:   Essential hypertension, benign   Hypothyroidism   Community acquired pneumonia/Rt LL   Vertebral fracture, osteoporotic/T5   Sternal fracture Discharge Condition: stable Diet recommendation: heart healthy  Spoke with pt's daughter. She states that pt is still the same as when d/c'd. She has had increased abdominal discomfort with some n/v/d that they are attributing to the abx treatment. Pt has not been eating much at all so has been taking medication on an almost empty stomach. Advised daughter to have pt eat something carb based and then take medication about 41mins later. Also advised to add probiotic and yogurt to daily diet to help with GI symptoms. Advised can place nutritional supplement in freezer and then mix with yogurt and fruit in blender to make more palatable. Daughter states that she will try this. She would like her mother to be seen sooner rather than later.   Appt scheduled with Dr. [name] for 02/07/17, daughter aware.    Transition Care Management Follow-up Telephone Call   Date discharged? 02/05/17   How have you been since you were released from the hospital? Not well, now having n/v/d from abx therapy   Do you understand why you were in the hospital? yes   Do you understand the discharge instructions? yes   Where were you discharged to? home   Items Reviewed:  Medications reviewed: yes  Allergies reviewed: yes  Dietary changes reviewed: yes  Referrals reviewed:  yes   Functional Questionnaire:   Activities of Daily Living (ADLs):   She states they are independent in the following: feeding, grooming States they require assistance with the following: ambulation, bathing and hygiene, continence, toileting and dressing   Any transportation issues/concerns?: no   Any patient concerns? yes   Confirmed importance and date/time of follow-up visits scheduled yes  Provider Appointment booked with Dr. [name] 02/07/17  Confirmed with patient if condition begins to worsen call PCP or go to the ER.  Patient was given the office number and encouraged to call back with question or concerns.  : yes

## 2017-02-06 NOTE — Patient Outreach (Signed)
[place] [facility]) Care Management  02/06/2017  Lori [name] 1918-07-15 [id number]   RNCM received call from clients daughter, Lori [name], who reports client is dry heaving and having soft bowel movement. She states it is not diarrhea. She reports client took both antibiotics at the same time around 1:30 and states she had eaten. Daughter is concerned about the effects of the antibiotic and states she would not want client to loose more fluid. RNCM encouraged daughter to call primary care to inform. Daughter stated she would contact primary care.  Plan: continue to follow.  [name], RN, MSN, [place] Coordinator Cell: [phone]

## 2017-02-06 NOTE — Patient Outreach (Signed)
[place] [facility]) Care Management  02/06/2017  CECILEE ROSNER [birth year] [id number]  Subjective: client without questions or concerns at this time.  Objective: none  Assessment: 83 year old with recent admission 8/26-9/5 with community acquired pneumonia, heart failure. History of HTN, TIA, hypothyroidism, colon cancer.  RNCM called for transition of care call. Client states she it is hard to hear and understand what RNCM is saying. Per client's request-both daughter, Belenda Cruise, and client on the line.   Medications reviewed-No questions or concerns.   Client has not started weighing self yet. RNCM discussed importance of weighing self. Home health has not contacted client, but daughter reports she has the name and contact number of home health and will call if they do no contact her today.  Upcoming appointments: client did not have cardiology appointment scheduled on after visit summary. RNCM called and confirmed appointment is scheduled for October 9th at 930am. Client had a previous appointment scheduled for November with primary care. RNCM encouraged daughter to call to schedule an appointment sooner within 1-2 weeks. Daughter stated she would call.  Plan: home visit next week.  [name], RN, MSN, [place] Coordinator Cell: [phone]

## 2017-02-07 ENCOUNTER — Encounter: Payer: Self-pay | Admitting: Family Medicine

## 2017-02-07 ENCOUNTER — Ambulatory Visit (INDEPENDENT_AMBULATORY_CARE_PROVIDER_SITE_OTHER): Payer: Medicare Other | Admitting: Family Medicine

## 2017-02-07 VITALS — BP 108/48 | HR 93 | Temp 97.9°F | Ht 60.0 in

## 2017-02-07 DIAGNOSIS — R0600 Dyspnea, unspecified: Secondary | ICD-10-CM

## 2017-02-07 DIAGNOSIS — S2220XA Unspecified fracture of sternum, initial encounter for closed fracture: Secondary | ICD-10-CM | POA: Diagnosis not present

## 2017-02-07 DIAGNOSIS — J189 Pneumonia, unspecified organism: Secondary | ICD-10-CM

## 2017-02-07 DIAGNOSIS — R627 Adult failure to thrive: Secondary | ICD-10-CM | POA: Diagnosis not present

## 2017-02-07 DIAGNOSIS — R634 Abnormal weight loss: Secondary | ICD-10-CM | POA: Diagnosis not present

## 2017-02-07 DIAGNOSIS — I5030 Unspecified diastolic (congestive) heart failure: Secondary | ICD-10-CM | POA: Diagnosis not present

## 2017-02-07 NOTE — Patient Instructions (Signed)
BEFORE YOU LEAVE: -follow up: 3-4 months -advance directive folder/discussion  -We placed a referral for you as discussed for the palliative care consult. It usually takes about 1-2 weeks to process and schedule this referral. If you have not heard from [place] regarding this appointment in 2 weeks please contact our office.  -follow up with cardiology as planned  -Continue the Azithromycin to complete course  -3 healthy meals per day and nutritional supplement as able

## 2017-02-07 NOTE — Progress Notes (Signed)
HPI:  Lori [name] is a pleasant 83 y.o. with a PMH significant for hypothyroidism, ischemic colitis, hypertension, colon ca here for a hospital follow up. See transitional care phone note in Epic. Per review of discharge documents and patient: Hospitalized 9/23 to 02/05/17 Primary admitting complaint(s): dyspnea, elevated d-dimer Primary admitting diagnosis (es) and treatment: CHF, CAP  - pulm edema, elevated BNP, EF 50% with grade 2 diastolic dysfunction, MR  -CT with ? CAP -treated with lasix in hospital, prn home and cardiology follow up outpt -PNA treated with abx in hospital - discharged on cefdinir and azithro -intermittent tachy - is to follow up with cardiology Other significant diagnosis (es) and treatment: HTN, Sternal fx - is to follow up with orthospine if symptomatic Follow up concerns per discharge document: Reports today: swelling in legs is resolved, breathing is somewhat better, she continues to feel tired, somewhat lackluster in motivation to do anything, does not feel like eating, the antibiotics have given her nausea and she has held them today.  Daughter's considering all options. Patient is adamant about being DO NOT RESUSCITATE today. They want to sign a DO NOT RESUSCITATE form. They do wish for a palliative consult and would consider hospice if this is available to them, as she does not wish to have extreme measures, surgery, feeding tube, resuscitation. She has a follow-up with cardiology in about a week. Denies:chest pain, shortness of breath today, fevers, swelling   ROS: See pertinent positives and negatives per HPI.  Past Medical History:  Diagnosis Date  . Colon cancer ([place]) 2009   s/p colon resection  . Endometrial cancer ([place]) 1989   endometrial s/p hysterectomy, chemo and radiation  . Hearing loss   . History of blood transfusion   . Hypertension   . Macular degeneration   . Migraines   . Pneumonia   . Positive TB test   . Stroke [facility]) 2004   ? TIA, brief episode of speech issues - evaluate OH    Past Surgical History:  Procedure Laterality Date  . ABDOMINAL HYSTERECTOMY  1989  . APPENDECTOMY    . COLON RESECTION  08/27/2007  . GALLBLADDER SURGERY  1970  . SUBDURAL HEMATOMA EVACUATION VIA CRANIOTOMY  2002  . TONSILLECTOMY AND ADENOIDECTOMY  1943    Family History  Problem Relation Age of Onset  . Arthritis Mother   . Heart disease Father   . Hyperlipidemia Father   . Hypertension Father   . Diabetes Sister   . Crohn's disease Daughter     Social History   Social History  . Marital status: Widowed    Spouse name: N/A  . Number of children: 2  . Years of education: N/A   Occupational History  . retired    Social History Main Topics  . Smoking status: Never Smoker  . Smokeless tobacco: Never Used  . Alcohol use Yes     Comment: occ glass of wine   . Drug use: No  . Sexual activity: Not Asked   Other Topics Concern  . 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  acetaminophen (TYLENOL) 500 MG tablet, Take 500 mg by mouth as needed., Disp: , Rfl:  .  Amino Acids-Protein Hydrolys (FEEDING SUPPLEMENT, PRO-STAT SUGAR FREE 64,) LIQD, Take 30 mLs by mouth 3 (three) times daily., Disp: 900 mL, Rfl: 0 .  azithromycin (ZITHROMAX Z-PAK) 250 MG tablet, Take 1 tablet (250 mg total) by mouth daily., Disp: 3 tablet, Rfl: 0 .  Cholecalciferol (VITAMIN D PO), Take 1 capsule by mouth daily. , Disp: , Rfl:  .  furosemide (LASIX) 20 MG tablet, Take 1 tablet (20 mg total) by mouth daily as needed. For swelling or shortness of breath, Disp: 30 tablet, Rfl: 0 .  levothyroxine (SYNTHROID, LEVOTHROID) 50 MCG tablet, TAKE 2 TABLETS ON MONDAY AND FRIDAY. TAKE 1 TABLET ALL OTHER DAYS, Disp: 144 tablet, Rfl: 1 .  lisinopril  (PRINIVIL,ZESTRIL) 20 MG tablet, TAKE 1 TABLET (20 MG TOTAL) BY MOUTH DAILY., Disp: 90 tablet, Rfl: 1 .  Multiple Vitamins-Minerals (EYE VITAMINS PO), Take 1 tablet by mouth 2 (two) times daily. , Disp: , Rfl:  .  Riboflavin (VITAMIN B-2 PO), Take 1 tablet by mouth daily. , Disp: , Rfl:  .  triamcinolone cream (KENALOG) 0.1 %, Apply 1 application topically 2 (two) times daily., Disp: 30 g, Rfl: 0 .  vitamin C (ASCORBIC ACID) 500 MG tablet, Take 500 mg by mouth daily., Disp: , Rfl:   EXAM:  Vitals:   02/07/17 1513  BP: (!) 108/48  Pulse: 93  Temp: 97.9 F (36.6 C)  SpO2: 97%    There is no height or weight on file to calculate BMI.  GENERAL: vitals reviewed and listed above, alert, oriented, appears well hydrated and in no acute distress, frail  HEENT: atraumatic, conjunttiva clear, no obvious abnormalities on inspection of external nose and ears  NECK: no obvious masses on inspection  LUNGS: clear to auscultation bilaterally, no wheezes, rales or rhonchi, good air movement  CV: HRRR, no peripheral edema  MS: in wheelchair  PSYCH: pleasant and cooperative, no obvious depression or anxiety  ASSESSMENT AND PLAN:  Discussed the following assessment and plan:  Weight loss - Plan: Amb Referral to Palliative Care  FTT (failure to thrive) in adult - Plan: Amb Referral to Palliative Care  Dyspnea, unspecified type - Plan: Amb Referral to Palliative Care  Diastolic congestive heart failure, unspecified HF chronicity ([place]) - Plan: Amb Referral to Palliative Care  Community acquired pneumonia, unspecified laterality  Closed fracture of sternum, unspecified portion of sternum, initial encounter  -She is in no pain with her fractures, no treatment for now other than vitamin D and encouraged healthy diet and nutritional supplement -Breathing seems to be improved, has follow-up with cardiology -Hold the [facility], this may be upsetting her stomach - continue azithromycin -discussed  various care options - sitter at home, alf vs snf, etc -advanced directives discussion with our health coach and DO NOT RESUSCITATE form signed today, they took a most form to discuss further and review with palliative care provider -Palliative care consult placed -Patient advised to return or notify a doctor immediately if symptoms worsen or persist or new concerns arise.  Patient Instructions  BEFORE YOU LEAVE: -follow up: 3-4 months -advance directive folder/discussion  -We placed a referral for you as discussed for the palliative care consult. It usually takes about 1-2 weeks to process and schedule this referral. If you have not heard from [place] regarding this appointment in 2 weeks please contact our office.  -follow up with cardiology as planned  -Continue the Azithromycin to complete course  -3 healthy meals per day and nutritional supplement as able  Colin Benton R., DO

## 2017-02-08 ENCOUNTER — Telehealth: Payer: Self-pay

## 2017-02-08 NOTE — Telephone Encounter (Signed)
Darlina Guys @ [facility] is calling to advise that they will be sending out a nurse tomorrow to start pt's [place] orders.  Dr. [name] - FYI. Thanks!

## 2017-02-09 DIAGNOSIS — I110 Hypertensive heart disease with heart failure: Secondary | ICD-10-CM | POA: Diagnosis not present

## 2017-02-09 DIAGNOSIS — M8008XD Age-related osteoporosis with current pathological fracture, vertebra(e), subsequent encounter for fracture with routine healing: Secondary | ICD-10-CM | POA: Diagnosis not present

## 2017-02-09 DIAGNOSIS — Z9181 History of falling: Secondary | ICD-10-CM | POA: Diagnosis not present

## 2017-02-09 DIAGNOSIS — I509 Heart failure, unspecified: Secondary | ICD-10-CM | POA: Diagnosis not present

## 2017-02-09 DIAGNOSIS — M8000XD Age-related osteoporosis with current pathological fracture, unspecified site, subsequent encounter for fracture with routine healing: Secondary | ICD-10-CM | POA: Diagnosis not present

## 2017-02-09 DIAGNOSIS — Z8701 Personal history of pneumonia (recurrent): Secondary | ICD-10-CM | POA: Diagnosis not present

## 2017-02-09 LAB — CULTURE, BLOOD (ROUTINE X 2)
CULTURE: NO GROWTH
Culture: NO GROWTH
Special Requests: ADEQUATE
Special Requests: ADEQUATE

## 2017-02-11 ENCOUNTER — Telehealth: Payer: Self-pay | Admitting: *Deleted

## 2017-02-11 NOTE — Telephone Encounter (Signed)
Lori [name] called from [facility] called requesting a verbal order for management of CHF-2 times a week for 3 weeks and 1 time a week for 5 weeks.  Home health aide to help with bathing and dressing 2 times a week for 8 weeks.  Please call Lori [name] at [phone].

## 2017-02-12 DIAGNOSIS — I110 Hypertensive heart disease with heart failure: Secondary | ICD-10-CM | POA: Diagnosis not present

## 2017-02-12 DIAGNOSIS — M8008XD Age-related osteoporosis with current pathological fracture, vertebra(e), subsequent encounter for fracture with routine healing: Secondary | ICD-10-CM | POA: Diagnosis not present

## 2017-02-12 DIAGNOSIS — Z9181 History of falling: Secondary | ICD-10-CM | POA: Diagnosis not present

## 2017-02-12 DIAGNOSIS — M8000XD Age-related osteoporosis with current pathological fracture, unspecified site, subsequent encounter for fracture with routine healing: Secondary | ICD-10-CM | POA: Diagnosis not present

## 2017-02-12 DIAGNOSIS — Z8701 Personal history of pneumonia (recurrent): Secondary | ICD-10-CM | POA: Diagnosis not present

## 2017-02-12 DIAGNOSIS — I509 Heart failure, unspecified: Secondary | ICD-10-CM | POA: Diagnosis not present

## 2017-02-12 NOTE — Telephone Encounter (Signed)
I called Est[facility] Bam[facility] and left a detailed message on the voicemail with the verbal order for below as approved by Dr [name].

## 2017-02-12 NOTE — Telephone Encounter (Signed)
Okay to send orders 2 asked to sign.

## 2017-02-13 DIAGNOSIS — Z9181 History of falling: Secondary | ICD-10-CM | POA: Diagnosis not present

## 2017-02-13 DIAGNOSIS — M8008XD Age-related osteoporosis with current pathological fracture, vertebra(e), subsequent encounter for fracture with routine healing: Secondary | ICD-10-CM | POA: Diagnosis not present

## 2017-02-13 DIAGNOSIS — I110 Hypertensive heart disease with heart failure: Secondary | ICD-10-CM | POA: Diagnosis not present

## 2017-02-13 DIAGNOSIS — Z8701 Personal history of pneumonia (recurrent): Secondary | ICD-10-CM | POA: Diagnosis not present

## 2017-02-13 DIAGNOSIS — M8000XD Age-related osteoporosis with current pathological fracture, unspecified site, subsequent encounter for fracture with routine healing: Secondary | ICD-10-CM | POA: Diagnosis not present

## 2017-02-13 DIAGNOSIS — I509 Heart failure, unspecified: Secondary | ICD-10-CM | POA: Diagnosis not present

## 2017-02-14 ENCOUNTER — Other Ambulatory Visit: Payer: Self-pay

## 2017-02-14 DIAGNOSIS — Z961 Presence of intraocular lens: Secondary | ICD-10-CM | POA: Diagnosis not present

## 2017-02-14 DIAGNOSIS — H209 Unspecified iridocyclitis: Secondary | ICD-10-CM | POA: Diagnosis not present

## 2017-02-14 DIAGNOSIS — H353124 Nonexudative age-related macular degeneration, left eye, advanced atrophic with subfoveal involvement: Secondary | ICD-10-CM | POA: Diagnosis not present

## 2017-02-14 DIAGNOSIS — H353112 Nonexudative age-related macular degeneration, right eye, intermediate dry stage: Secondary | ICD-10-CM | POA: Diagnosis not present

## 2017-02-14 DIAGNOSIS — H15101 Unspecified episcleritis, right eye: Secondary | ICD-10-CM | POA: Diagnosis not present

## 2017-02-14 NOTE — Patient Outreach (Signed)
[facility]) Care Management   02/14/2017  Lori [name] [birth year] [id number]  Lori [name] is an 83 y.o. female  Subjective: client reports feeling better, but still feels weak.  Objective:  BP (!) 98/50   Pulse 86   Ht 1.524 m (5')   Wt 85 lb 6.4 oz (38.7 kg)   SpO2 97%   BMI 16.68 kg/m .  ROS  Physical Exam Respirations even unlabored. No edema noted to extremities.  Encounter Medications:   Outpatient Encounter Prescriptions as of 02/14/2017  Medication Sig Note  . acetaminophen (TYLENOL) 500 MG tablet Take 500 mg by mouth as needed.   . Cholecalciferol (VITAMIN D PO) Take 1 capsule by mouth daily.    Marland Kitchen levothyroxine (SYNTHROID, LEVOTHROID) 50 MCG tablet TAKE 2 TABLETS ON MONDAY AND FRIDAY. TAKE 1 TABLET ALL OTHER DAYS   . lisinopril (PRINIVIL,ZESTRIL) 20 MG tablet TAKE 1 TABLET (20 MG TOTAL) BY MOUTH DAILY.   . Multiple Vitamins-Minerals (EYE VITAMINS PO) Take 1 tablet by mouth 2 (two) times daily.  03/09/2016: AREDS 2   . Riboflavin (VITAMIN B-2 PO) Take 1 tablet by mouth daily.    Marland Kitchen triamcinolone cream (KENALOG) 0.1 % Apply 1 application topically 2 (two) times daily.   . Amino Acids-Protein Hydrolys (FEEDING SUPPLEMENT, PRO-STAT SUGAR FREE 64,) LIQD Take 30 mLs by mouth 3 (three) times daily. (Patient not taking: Reported on 02/14/2017)   . furosemide (LASIX) 20 MG tablet Take 1 tablet (20 mg total) by mouth daily as needed. For swelling or shortness of breath (Patient not taking: Reported on 02/14/2017)   . vitamin C (ASCORBIC ACID) 500 MG tablet Take 500 mg by mouth daily.    No facility-administered encounter medications on file as of 02/14/2017.     Functional Status:   In your present state of health, do you have any difficulty performing the following activities: 02/14/2017 02/04/2017  Hearing? Y Y  Comment - -  Vision? N N  Difficulty concentrating or making decisions? Tempie Donning  Walking or climbing stairs? Y Y  Dressing or bathing? - N  Doing  errands, shopping? - Y  Preparing Food and eating ? N -  Using the Toilet? N -  In the past six months, have you accidently leaked urine? N -  Comment - -  Do you have problems with loss of bowel control? N -  Managing your Medications? N -  Comment daughter assist -  Managing your Finances? Y -  Housekeeping or managing your Housekeeping? Y -  Some recent data might be hidden    Fall/Depression Screening:    Fall Risk  02/14/2017 03/09/2016 01/12/2016  Falls in the past year? Yes No No  Comment - - Emmi Telephone Survey: data to providers prior to load  Number falls in past yr: 1 - -  Injury with Fall? Yes - -  Risk for fall due to : History of fall(s) - -  Follow up Falls prevention discussed;Education provided - -   PHQ 2/9 Scores 02/14/2017 03/09/2016 08/09/2014 01/30/2013 01/30/2013  PHQ - 2 Score 0 0 0 0 0    Assessment:  83 year old with recent admission to hospital for pneumonia/heart failure. Client 's daughter, Lori [name] present during visit. Active with home health [facility] for nursing and physical therapy. Ambulates slowly-steady with cane.  Present during visit was client's daughter Lori [name]. RNCM spoke with Lori [name] reports client has been living with her for the past 5 years and states  things have worked out well. Lori [name] reports assisted living is an option, but not currently seeking this option.    Client reports she is feeling better since admission to hospital, but client reports being week. Heart failure is a new diagnosis. Client/daughter is agreeable to education/follow up regarding heart failure.  History of weight loss. Daughter reports client has lost approximately 27 pounds since April. She reports her current weight is 85 pounds. Client states she would not like to loose more weight and is receptive to discussing strategies to increase healthy weight.  Plan: transition of care call next week.  [facility] CM Care Plan Problem One     Most Recent Value   Care Plan Problem One  at risk for readmission  Role Documenting the Problem One  Care Management [place] for Problem One  Active  [facility] Long Term Goal   client will not be readmitted within the next 31 days.  [facility] Long Term Goal Start Date  02/06/17  Interventions for Problem One Long Term Goal  home visit completed, provided [facility] calender organizer and explained how to use, RNCM discussed basic of heart failure and how fluid builds up in system, signs/symptoms of exacrerbation discussed.  [facility] CM Short Term Goal #1   daughter will call primary care to schedule a sooner appointment within the next 1-2 weeks.  [facility] CM Short Term Goal #1 Start Date  02/06/17  Interventions for Short Term Goal #1  discussed upcoming appointments, discussed the importance of following up with all providers.  [facility] CM Short Term Goal #2   daughter will verbalize contact with home health within the next week.  [facility] CM Short Term Goal #2 Start Date  02/06/17  [facility] CM Short Term Goal #2 Met Date  02/14/17    [facility] CM Care Plan Problem Two     Most Recent Value  Care Plan Problem Two  weight loss as evidence by client/daughter report.  Role Documenting the Problem Two  Care Management Coordinator  Care Plan for Problem Two  Active  Interventions for Problem Two Long Term Goal   discussed importance of good nutrition  [facility] Long Term Goal  within the next 60 days, client report increase weight gain and/or maintain current weight.  [facility]N Long Term Goal Start Date  02/14/17  [facility] CM Short Term Goal #1   will add at least one snack/day to current 3 meal a day regimen..  [facility] CM Short Term Goal #1 Start Date  02/14/17  Interventions for Short Term Goal #2   discussed eating smaller more frequent meals/day, discussed adding healthy high calorie food to diet.      [name], RN, MSN, [place] Coordinator Cell: [phone]

## 2017-02-15 DIAGNOSIS — Z9181 History of falling: Secondary | ICD-10-CM | POA: Diagnosis not present

## 2017-02-15 DIAGNOSIS — Z8701 Personal history of pneumonia (recurrent): Secondary | ICD-10-CM | POA: Diagnosis not present

## 2017-02-15 DIAGNOSIS — I110 Hypertensive heart disease with heart failure: Secondary | ICD-10-CM | POA: Diagnosis not present

## 2017-02-15 DIAGNOSIS — M8000XD Age-related osteoporosis with current pathological fracture, unspecified site, subsequent encounter for fracture with routine healing: Secondary | ICD-10-CM | POA: Diagnosis not present

## 2017-02-15 DIAGNOSIS — I509 Heart failure, unspecified: Secondary | ICD-10-CM | POA: Diagnosis not present

## 2017-02-15 DIAGNOSIS — M8008XD Age-related osteoporosis with current pathological fracture, vertebra(e), subsequent encounter for fracture with routine healing: Secondary | ICD-10-CM | POA: Diagnosis not present

## 2017-02-18 DIAGNOSIS — I509 Heart failure, unspecified: Secondary | ICD-10-CM | POA: Diagnosis not present

## 2017-02-18 DIAGNOSIS — Z8701 Personal history of pneumonia (recurrent): Secondary | ICD-10-CM | POA: Diagnosis not present

## 2017-02-18 DIAGNOSIS — I110 Hypertensive heart disease with heart failure: Secondary | ICD-10-CM | POA: Diagnosis not present

## 2017-02-18 DIAGNOSIS — M8008XD Age-related osteoporosis with current pathological fracture, vertebra(e), subsequent encounter for fracture with routine healing: Secondary | ICD-10-CM | POA: Diagnosis not present

## 2017-02-18 DIAGNOSIS — M8000XD Age-related osteoporosis with current pathological fracture, unspecified site, subsequent encounter for fracture with routine healing: Secondary | ICD-10-CM | POA: Diagnosis not present

## 2017-02-18 DIAGNOSIS — Z9181 History of falling: Secondary | ICD-10-CM | POA: Diagnosis not present

## 2017-02-19 ENCOUNTER — Ambulatory Visit: Payer: Medicare Other | Admitting: Physician Assistant

## 2017-02-19 ENCOUNTER — Ambulatory Visit (INDEPENDENT_AMBULATORY_CARE_PROVIDER_SITE_OTHER): Payer: Medicare Other | Admitting: Physician Assistant

## 2017-02-19 ENCOUNTER — Encounter: Payer: Self-pay | Admitting: Physician Assistant

## 2017-02-19 VITALS — BP 118/60 | HR 86 | Ht 62.0 in | Wt 88.0 lb

## 2017-02-19 DIAGNOSIS — I340 Nonrheumatic mitral (valve) insufficiency: Secondary | ICD-10-CM | POA: Diagnosis not present

## 2017-02-19 DIAGNOSIS — I10 Essential (primary) hypertension: Secondary | ICD-10-CM | POA: Diagnosis not present

## 2017-02-19 DIAGNOSIS — E46 Unspecified protein-calorie malnutrition: Secondary | ICD-10-CM | POA: Diagnosis not present

## 2017-02-19 DIAGNOSIS — D649 Anemia, unspecified: Secondary | ICD-10-CM | POA: Diagnosis not present

## 2017-02-19 DIAGNOSIS — E039 Hypothyroidism, unspecified: Secondary | ICD-10-CM

## 2017-02-19 DIAGNOSIS — Z0289 Encounter for other administrative examinations: Secondary | ICD-10-CM

## 2017-02-19 DIAGNOSIS — R0600 Dyspnea, unspecified: Secondary | ICD-10-CM

## 2017-02-19 DIAGNOSIS — Z79899 Other long term (current) drug therapy: Secondary | ICD-10-CM

## 2017-02-19 NOTE — Progress Notes (Signed)
Cardiology Office Note    Date:  02/19/2017   ID:  Lori [name], DOB Dec 30, 1918, MRN [id number]  PCP:  Lucretia Kern, DO  Cardiologist:  Dr. Percival Spanish  Chief Complaint  Patient presents with  . Follow-up    seen for Dr. Percival Spanish.     History of Present Illness:  Lori [name] is a 83 y.o. female with PMH of CVA, macular degeneration, HTN, hypothyroidism, colon cancer and endometrial cancer. She was recently admitted to [facility] on 02/03/2017 with shortness of breath and elevated d-dimer at PCPs office and sent to the ED. Venous Doppler was negative for DVT. CTA of the chest showed no PE but a small right lower lobe consolidation suggesting possible pneumonia. Moderate cardiomegaly with vascular congestion as well. Also new acute to subacute appearing displaced sternal fracture and new subacute severe T5 compression fracture. She was given 20 mg IV Lasix in the ED and placed on 20 mg daily of Lasix. Echocardiogram obtained on 02/04/2014 showed EF 50%, diffuse hypokinesis, grade 2 DD, mild focal basal hypertrophy of the septum, moderate to severe MR with possible small flail segment of the anterior leaflet, severely dilated LA. Given her advanced age, it was decided to manage the moderate to severe MR medically. She also has paroxysmal atrial tachycardia. TSH normal. It was felt her symptom was not severe enough at this time to justify daily beta blocker. She was discharged on PRN lasix.   She is accompanied by her daughter today. She has dyspnea with exertion when she ambulated for short distances. Otherwise, she is breathing okay. The symptom has progressively worsened in the past several months. On physical exam, she is euvolemic without significant lower extremity edema, orthopnea or PND. Her lung is clear. I think she will always have some degree of dyspnea, likely related to moderate to severe MR. Her daughter also describes occasional cough that woke her up from sleep. This is  likely related to backward pressure from the mitral regurgitation into the pulmonary vasculature. Otherwise she is maintaining sinus rhythm. The plan is to hold off on adding beta blocker unless she has increased palpitation. She still feels the pounding sensation in her chest occasionally, but otherwise she had no associated symptom with this.   Past Medical History:  Diagnosis Date  . Colon cancer ([place]) 2009   s/p colon resection  . Endometrial cancer ([place]) 1989   endometrial s/p hysterectomy, chemo and radiation  . Hearing loss   . History of blood transfusion   . Hypertension   . Macular degeneration   . Migraines   . Pneumonia   . Positive TB test   . Stroke [facility]) 2004   ? TIA, brief episode of speech issues - evaluate OH    Past Surgical History:  Procedure Laterality Date  . ABDOMINAL HYSTERECTOMY  1989  . APPENDECTOMY    . COLON RESECTION  08/27/2007  . GALLBLADDER SURGERY  1970  . SUBDURAL HEMATOMA EVACUATION VIA CRANIOTOMY  2002  . TONSILLECTOMY AND ADENOIDECTOMY  1943    Current Medications: Outpatient Medications Prior to Visit  Medication Sig Dispense Refill  . acetaminophen (TYLENOL) 500 MG tablet Take 500 mg by mouth as needed.    . Cholecalciferol (VITAMIN D PO) Take 1 capsule by mouth daily.     . furosemide (LASIX) 20 MG tablet Take 1 tablet (20 mg total) by mouth daily as needed. For swelling or shortness of breath 30 tablet 0  . levothyroxine (SYNTHROID, LEVOTHROID)  50 MCG tablet TAKE 2 TABLETS ON MONDAY AND FRIDAY. TAKE 1 TABLET ALL OTHER DAYS 144 tablet 1  . lisinopril (PRINIVIL,ZESTRIL) 20 MG tablet TAKE 1 TABLET (20 MG TOTAL) BY MOUTH DAILY. 90 tablet 1  . Multiple Vitamins-Minerals (EYE VITAMINS PO) Take 1 tablet by mouth 2 (two) times daily.     . Riboflavin (VITAMIN B-2 PO) Take 1 tablet by mouth daily.     Marland Kitchen triamcinolone cream (KENALOG) 0.1 % Apply 1 application topically 2 (two) times daily. 30 g 0  . Amino Acids-Protein Hydrolys (FEEDING  SUPPLEMENT, PRO-STAT SUGAR FREE 64,) LIQD Take 30 mLs by mouth 3 (three) times daily. (Patient not taking: Reported on 02/14/2017) 900 mL 0  . vitamin C (ASCORBIC ACID) 500 MG tablet Take 500 mg by mouth daily.     No facility-administered medications prior to visit.      Allergies:   Bactrim [sulfamethoxazole-trimethoprim]; Clindamycin/lincomycin; Macrobid [nitrofurantoin macrocrystal]; Penicillins; Sulfa antibiotics; and Zofran [ondansetron hcl]   Social History   Social History  . Marital status: Widowed    Spouse name: N/A  . Number of children: 2  . Years of education: N/A   Occupational History  . retired    Social History Main Topics  . Smoking status: Never Smoker  . Smokeless tobacco: Never Used  . Alcohol use Yes     Comment: occ glass of wine   . Drug use: No  . Sexual activity: Not Asked   Other Topics Concern  . 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   ROS:   Please see the history of present illness.    ROS All other systems reviewed and are negative.   PHYSICAL EXAM:   VS:  BP 118/60   Pulse 86   Ht 5\' 2"  (1.575 m)   Wt 88 lb (39.9 kg)   SpO2 96%   BMI 16.10 kg/m    GEN: Thin and cachectic HEENT: normal  Neck: no JVD, carotid bruits, or masses Cardiac: RRR; no murmurs, rubs, or gallops,no edema  Respiratory:  clear to auscultation bilaterally, normal work of breathing GI: soft, nontender, nondistended, + BS MS: no deformity or atrophy  Skin: warm and dry, no rash Neuro:  Alert and Oriented x 3, Strength and sensation are intact Psych: euthymic mood, full  affect  Wt Readings from Last 3 Encounters:  02/19/17 88 lb (39.9 kg)  02/14/17 85 lb 6.4 oz (38.7 kg)  02/05/17 88 lb 13.5 oz (40.3 kg)      Studies/Labs Reviewed:   EKG:  EKG is not ordered today.   Recent Labs: 02/03/2017: B Natriuretic Peptide 391.6 02/04/2017: TSH 3.818 02/05/2017: BUN 11; Creatinine, Ser 0.55; Hemoglobin 10.4; Platelets 420; Potassium 4.1; Sodium 135   Lipid Panel    Component Value Date/Time   CHOL 155 09/03/2016 1104   TRIG 88.0 09/03/2016 1104   HDL 91.10 09/03/2016 1104   CHOLHDL 2 09/03/2016 1104   VLDL 17.6 09/03/2016 1104   LDLCALC 46 09/03/2016 1104    Additional studies/ records that  were reviewed today include:   Echo 02/04/2017 LV EF: 50%  Study Conclusions  - Left ventricle: The cavity size was normal. There was mild focal   basal hypertrophy of the septum. The estimated ejection fraction   was 50%. Diffuse hypokinesis. Features are consistent with a   pseudonormal left ventricular filling pattern, with concomitant   abnormal relaxation and increased filling pressure (grade 2   diastolic dysfunction). E/medial e&' > 15 suggesting LVEDP > 20   mmHg. - Aortic valve: There was no stenosis. - Mitral valve: Mildly calcified annulus. Mildly calcified leaflets   . Possible small flail segment of the anterior leaflet with   posteriorly-directed mitral regurgitation. There was moderate to   severe regurgitation. - Left atrium: The atrium was severely dilated. - Right ventricle: The cavity size was normal. Systolic function   was normal. - Tricuspid valve: Peak RV-RA gradient (S): 20 mm Hg. - Pulmonary arteries: PA peak pressure: 28 mm Hg (S). - Systemic veins: IVC measured 1.9 cm with < 50% respirophasic   variation, suggesting RA pressure 8 mmHg.  Impressions:  - Normal LV size with mild focal basal septal hypertrophy. EF 50%,   mild diffuse hypokinesis. Moderate diastolic dysfunction. Normal   RV size and systolic function.  Severe LAE. Moderate to severe   mitral regurgitation with possible flail segment of anterior   leaflet (posteriorly directed jet). Would consider TEE to further   evaluate.   ASSESSMENT:    1. Moderate to severe mitral regurgitation   2. Encounter for long-term (current) use of medications   3. Anemia, unspecified type   4. Dyspnea, unspecified type   5. Essential hypertension   6. Hypothyroidism, unspecified type   7. Malnutrition, unspecified type ([place])      PLAN:  In order of problems listed above:  1. Dyspnea on exertion: Likely related to her advanced age and also worsening MR. Given advanced age, plan is conservative management. She is euvolemic on physical exam. She will continue as needed dose of Lasix. We will obtain basic metabolic panel today. Will also check CBC and vitamin B-12 as well given history of anemia.  2. Moderate to severe MR: Not a candidate for any advanced intervention due to her age.  3. Hypertension: Blood pressure stable, continue lisinopril. She still has occasional palpitation sensation, however will hold off on beta blocker unless more symptomatic.  4. Hypothyroidism: On Synthroid.  5. Protein malnutrition: She is very cachectic and weighs only 88 pounds.    Medication Adjustments/Labs and Tests Ordered: Current medicines are reviewed at length with the patient today.  Concerns regarding medicines are outlined above.  Medication changes, Labs and Tests ordered today are listed in the Patient Instructions below. Patient Instructions  Heart Failure Prevention Plan: 1. Avoid salt 2. Limit fluid intake to less than 1.5 liters per day.  3. Weigh yourself every morning and call cardiology if weight increase by more than 2 lbs overnight or 5 lbs in a single week  Your baseline weight as of 10/9 is 88 lbs   Medication Instructions:   No changes  Labwork:   BMET, CBC, and B-12 levels today  Testing/Procedures:  none  Follow-Up:  With  Dr. Percival Spanish in 3 months  Any Other Special Instructions Will Be Listed Below (If Applicable).     If you need a refill on your cardiac medications before your next appointment, please call your pharmacy.      Hilbert Corrigan, [place]  02/19/2017 9:42 PM  [facility] [address], [place], [place]  [address] Phone: [phone]; Fax: [phone]

## 2017-02-19 NOTE — Patient Instructions (Addendum)
Heart Failure Prevention Plan: 1. Avoid salt 2. Limit fluid intake to less than 1.5 liters per day.  3. Weigh yourself every morning and call cardiology if weight increase by more than 2 lbs overnight or 5 lbs in a single week  Your baseline weight as of 10/9 is 88 lbs   Medication Instructions:   No changes  Labwork:   BMET, CBC, and B-12 levels today  Testing/Procedures:  none  Follow-Up:  With Dr. Percival Spanish in 3 months  Any Other Special Instructions Will Be Listed Below (If Applicable).     If you need a refill on your cardiac medications before your next appointment, please call your pharmacy.

## 2017-02-20 ENCOUNTER — Other Ambulatory Visit: Payer: Self-pay

## 2017-02-20 LAB — CBC
HEMATOCRIT: 34.1 % (ref 34.0–46.6)
Hemoglobin: 11.1 g/dL (ref 11.1–15.9)
MCH: 29.8 pg (ref 26.6–33.0)
MCHC: 32.6 g/dL (ref 31.5–35.7)
MCV: 91 fL (ref 79–97)
Platelets: 399 10*3/uL — ABNORMAL HIGH (ref 150–379)
RBC: 3.73 x10E6/uL — AB (ref 3.77–5.28)
RDW: 15.3 % (ref 12.3–15.4)
WBC: 5.5 10*3/uL (ref 3.4–10.8)

## 2017-02-20 LAB — VITAMIN B12: Vitamin B-12: 577 pg/mL (ref 232–1245)

## 2017-02-20 LAB — BASIC METABOLIC PANEL
BUN/Creatinine Ratio: 23 (ref 12–28)
BUN: 13 mg/dL (ref 10–36)
CALCIUM: 9 mg/dL (ref 8.7–10.3)
CHLORIDE: 100 mmol/L (ref 96–106)
CO2: 27 mmol/L (ref 20–29)
Creatinine, Ser: 0.57 mg/dL (ref 0.57–1.00)
GFR calc Af Amer: 90 mL/min/{1.73_m2} (ref >59)
GFR, EST NON AFRICAN AMERICAN: 78 mL/min/{1.73_m2} (ref >59)
Glucose: 91 mg/dL (ref 65–99)
POTASSIUM: 6.2 mmol/L — AB (ref 3.5–5.2)
Sodium: 139 mmol/L (ref 134–144)

## 2017-02-20 NOTE — Patient Outreach (Signed)
[name]) Care Management  02/20/2017  Lori [name] Lori [name] [id number]   Subjective: none  Objective: none  Assessment: 83 year old with recent admission to hospital for pneumonia/heart failure. History of HTN, TIA, hypothyroidism, colon cancer. Active with [facility] health [facility] for nursing and physical therapy.  RNCM called for transition of care. No answer. HIPPA compliant message left.  Plan: continue call to follow weekly for transition of care.  [name], RN, MSN, [place] Coordinator Cell: [phone]

## 2017-02-21 ENCOUNTER — Telehealth: Payer: Self-pay | Admitting: *Deleted

## 2017-02-21 DIAGNOSIS — I509 Heart failure, unspecified: Secondary | ICD-10-CM | POA: Diagnosis not present

## 2017-02-21 DIAGNOSIS — M8000XD Age-related osteoporosis with current pathological fracture, unspecified site, subsequent encounter for fracture with routine healing: Secondary | ICD-10-CM | POA: Diagnosis not present

## 2017-02-21 DIAGNOSIS — M8008XD Age-related osteoporosis with current pathological fracture, vertebra(e), subsequent encounter for fracture with routine healing: Secondary | ICD-10-CM | POA: Diagnosis not present

## 2017-02-21 DIAGNOSIS — I110 Hypertensive heart disease with heart failure: Secondary | ICD-10-CM | POA: Diagnosis not present

## 2017-02-21 DIAGNOSIS — Z9181 History of falling: Secondary | ICD-10-CM | POA: Diagnosis not present

## 2017-02-21 DIAGNOSIS — Z8701 Personal history of pneumonia (recurrent): Secondary | ICD-10-CM | POA: Diagnosis not present

## 2017-02-21 NOTE — Telephone Encounter (Signed)
S/w Levada Dy from [facility], gave verbal for BMET draw, she voiced understanding, RBV, will draw Monday on return visit and have results faxed to our office.

## 2017-02-21 NOTE — Telephone Encounter (Signed)
Follow up     Levada Dy from [facility] is calling to find out if they need to draw blood for pt.

## 2017-02-21 NOTE — Telephone Encounter (Signed)
Had discussed w Isaac Laud. Preference would be to recheck today or tomorrow, if not feasible recheck w/in next few days. No recent med changes, so high K+ could be d/t hemolysis, however this was not noted by technician.  Called and spoke to [place], daughter of patient. She voiced understanding of recommendations. Pt has home care nurses who may be able to draw this at home - she will check with them and have them give [place] a call if so, so that orders can be communicated.

## 2017-02-21 NOTE — Telephone Encounter (Signed)
-----   Message from [place], [place] sent at 02/20/2017  5:04 PM EDT ----- Potassium high, unclear reason, lab did not mention any hemolysis. Patient has been on lisinopril for years. Recommend recheck BMET

## 2017-02-22 ENCOUNTER — Telehealth: Payer: Self-pay | Admitting: Family Medicine

## 2017-02-22 NOTE — Telephone Encounter (Signed)
[facility] - Client [place] [facility] Patient Name: Lori [name] DOB: 08-10-1918 Initial Comment Mother having severe pain under bottom ribs on left side. Recently in hospital with mild to moderate heart failure. Nurse Assessment Nurse: Jimmye Norman, RN, Olin Hauser Date/Time Eilene Ghazi Time): 02/22/2017 4:21:30 PM Confirm and document reason for call. If symptomatic, describe symptoms. ---caller states mom is having left side under the rib severe pain started at 20 to 30 min ago, no radiation, stabbing, no sob, no fever. Dx with Heart Failure last month. has a sternal fx at T 5 Does the patient have any new or worsening symptoms? ---Yes Will a triage be completed? ---Yes Related visit to physician within the last 2 weeks? ---Yes Does the PT have any chronic conditions? (i.e. diabetes, asthma, etc.) ---Yes List chronic conditions. ---Heart Failure, HTN Is this a behavioral health or substance abuse call? ---NoGuidelines Guideline Title Affirmed Question Affirmed Notes Chest Pain [1] Chest pain lasts > 5 minutes AND [2] history of heart disease (i.e., heart attack, bypass surgery, angina, angioplasty, CHF; not just a heart murmur) Final Disposition User Call EMS 911 Now Jimmye Norman, RN, Olin Hauser Comments daughter is a poor historian, having to run back and forward to ask mom questions. Now [employer] of back has changed to to lower rib area under left breast. daughter is going to ask mom if she will go. states she probably will not want to. I told her I would call back in 5 minutes. Odd call. Mom is a DNR Caller Disagree/Comply Disagree Caller Understands Yes PreDisposition InappropriateToAsk Call Id: [id number]

## 2017-02-25 DIAGNOSIS — Z8701 Personal history of pneumonia (recurrent): Secondary | ICD-10-CM | POA: Diagnosis not present

## 2017-02-25 DIAGNOSIS — I10 Essential (primary) hypertension: Secondary | ICD-10-CM | POA: Diagnosis not present

## 2017-02-25 DIAGNOSIS — M8008XD Age-related osteoporosis with current pathological fracture, vertebra(e), subsequent encounter for fracture with routine healing: Secondary | ICD-10-CM | POA: Diagnosis not present

## 2017-02-25 DIAGNOSIS — M8000XD Age-related osteoporosis with current pathological fracture, unspecified site, subsequent encounter for fracture with routine healing: Secondary | ICD-10-CM | POA: Diagnosis not present

## 2017-02-25 DIAGNOSIS — I509 Heart failure, unspecified: Secondary | ICD-10-CM | POA: Diagnosis not present

## 2017-02-25 DIAGNOSIS — I110 Hypertensive heart disease with heart failure: Secondary | ICD-10-CM | POA: Diagnosis not present

## 2017-02-25 DIAGNOSIS — Z9181 History of falling: Secondary | ICD-10-CM | POA: Diagnosis not present

## 2017-02-25 NOTE — Telephone Encounter (Signed)
See previous recommendation
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
## 2017-02-26 ENCOUNTER — Other Ambulatory Visit: Payer: Self-pay

## 2017-02-26 ENCOUNTER — Other Ambulatory Visit: Payer: Self-pay | Admitting: Cardiothoracic Surgery

## 2017-02-26 DIAGNOSIS — S2220XD Unspecified fracture of sternum, subsequent encounter for fracture with routine healing: Secondary | ICD-10-CM

## 2017-02-26 DIAGNOSIS — M8008XD Age-related osteoporosis with current pathological fracture, vertebra(e), subsequent encounter for fracture with routine healing: Secondary | ICD-10-CM | POA: Diagnosis not present

## 2017-02-26 DIAGNOSIS — I110 Hypertensive heart disease with heart failure: Secondary | ICD-10-CM | POA: Diagnosis not present

## 2017-02-26 DIAGNOSIS — Z9181 History of falling: Secondary | ICD-10-CM | POA: Diagnosis not present

## 2017-02-26 DIAGNOSIS — I509 Heart failure, unspecified: Secondary | ICD-10-CM | POA: Diagnosis not present

## 2017-02-26 DIAGNOSIS — M8000XD Age-related osteoporosis with current pathological fracture, unspecified site, subsequent encounter for fracture with routine healing: Secondary | ICD-10-CM | POA: Diagnosis not present

## 2017-02-26 DIAGNOSIS — Z8701 Personal history of pneumonia (recurrent): Secondary | ICD-10-CM | POA: Diagnosis not present

## 2017-02-26 NOTE — Patient Outreach (Signed)
[facility]) Care Management  02/26/2017  JOHNATHON MITTAL 03/21/1919 [id number]  Subjective: Daughter reports client is making good progress with the physical therapist.  Objective: none  Assessment: 83 year old with recent admission to hospital for pneumonia/heart failure. History of HTN, TIA, hypothyroidism, colon cancer. Active with home health [facility] nursing and physical therapy.  RNCM called for transition of care call. Spoke with daughter [name] (primary caregiver). [name] reports client has been to her cardiologist appointment. No changes made. She states Ms. Vaillancourt has an appointment with the cardiothoracic surgeon on tomorrow for a follow up.  Client continues to monitor weights. Ms. Lister reports prn Lasix given x2 since discharge for increase shortness of breath and increase swelling. She reports the last prn lasix was given last week, the day before client's cardiology appointment.  No issues or concerns noted at this time.  Plan: follow up next week.  [name], RN, MSN, [place] Coordinator Cell: [phone]

## 2017-02-27 ENCOUNTER — Institutional Professional Consult (permissible substitution) (INDEPENDENT_AMBULATORY_CARE_PROVIDER_SITE_OTHER): Payer: Medicare Other | Admitting: Cardiothoracic Surgery

## 2017-02-27 ENCOUNTER — Encounter: Payer: Self-pay | Admitting: Cardiothoracic Surgery

## 2017-02-27 ENCOUNTER — Ambulatory Visit
Admission: RE | Admit: 2017-02-27 | Discharge: 2017-02-27 | Disposition: A | Discharge status: Home / Self Care | Payer: Medicare Other | Source: Ambulatory Visit | Attending: Cardiothoracic Surgery | Admitting: Cardiothoracic Surgery

## 2017-02-27 VITALS — BP 121/80 | HR 89 | Ht 60.0 in | Wt 89.0 lb

## 2017-02-27 DIAGNOSIS — S2220XD Unspecified fracture of sternum, subsequent encounter for fracture with routine healing: Secondary | ICD-10-CM

## 2017-02-27 DIAGNOSIS — S2222XD Fracture of body of sternum, subsequent encounter for fracture with routine healing: Secondary | ICD-10-CM

## 2017-02-27 DIAGNOSIS — I509 Heart failure, unspecified: Secondary | ICD-10-CM | POA: Diagnosis not present

## 2017-02-27 NOTE — Progress Notes (Signed)
PCP is Lucretia Kern, DO Referring Provider is Elodia Florence.,*  Chief Complaint  Patient presents with  . New Patient (Initial Visit)    displaced sternal fracture, cta chest 02/03/2017    HPI: The patient is a 83 year old frail female who presents for evaluation of a sternal mid body fracture with slight displacement. The patient was hospitalized in late September for pneumonia, heart failure, and CT scan of the chest was performed showing the slight anterior displaced  sternal body fracture. Fortunately the patient has had minimal pain if any. Today she presents with chest x-ray which shows some improvement in the displaced fracture. The patient has severe thoracic kyphosis with compression fractures of T5 and a lumbar vertebral bodies well. She recalls no significant falls or injuries to her chest.  On exam she has a pectus deformity of her sternum but no instability or point tenderness. I recommended that the patient supplement her diet with calcium tablets-either Tums or Os-Cal twice a day for 2 weeks. The sternal fracture should heal fine with time and no intervention.  Past Medical History:  Diagnosis Date  . Colon cancer ([place]) 2009   s/p colon resection  . Endometrial cancer ([place]) 1989   endometrial s/p hysterectomy, chemo and radiation  . Hearing loss   . History of blood transfusion   . Hypertension   . Macular degeneration   . Migraines   . Pneumonia   . Positive TB test   . Stroke [facility]) 2004   ? TIA, brief episode of speech issues - evaluate OH    Past Surgical History:  Procedure Laterality Date  . ABDOMINAL HYSTERECTOMY  1989  . APPENDECTOMY    . COLON RESECTION  08/27/2007  . GALLBLADDER SURGERY  1970  . SUBDURAL HEMATOMA EVACUATION VIA CRANIOTOMY  2002  . TONSILLECTOMY AND ADENOIDECTOMY  1943    Family History  Problem Relation Age of Onset  . Arthritis Mother   . Heart disease Father   . Hyperlipidemia Father   . Hypertension Father   .  Diabetes Sister   . Crohn's disease Daughter     Social History Social History  Substance Use Topics  . Smoking status: Never Smoker  . Smokeless tobacco: Never Used  . Alcohol use Yes     Comment: occ glass of wine     Current Outpatient Prescriptions  Medication Sig Dispense Refill  . acetaminophen (TYLENOL) 500 MG tablet Take 500 mg by mouth as needed.    . Cholecalciferol (VITAMIN D PO) Take 1 capsule by mouth daily.     . furosemide (LASIX) 20 MG tablet Take 1 tablet (20 mg total) by mouth daily as needed. For swelling or shortness of breath 30 tablet 0  . levothyroxine (SYNTHROID, LEVOTHROID) 50 MCG tablet TAKE 2 TABLETS ON MONDAY AND FRIDAY. TAKE 1 TABLET ALL OTHER DAYS 144 tablet 1  . lisinopril (PRINIVIL,ZESTRIL) 20 MG tablet TAKE 1 TABLET (20 MG TOTAL) BY MOUTH DAILY. 90 tablet 1  . Multiple Vitamins-Minerals (EYE VITAMINS PO) Take 1 tablet by mouth 2 (two) times daily.     . Riboflavin (VITAMIN B-2 PO) Take 1 tablet by mouth daily.     Marland Kitchen triamcinolone cream (KENALOG) 0.1 % Apply 1 application topically 2 (two) times daily. 30 g 0   No current facility-administered medications for this visit.     Allergies  Allergen Reactions  . Bactrim [Sulfamethoxazole-Trimethoprim] Itching    Blisters   . Clindamycin/Lincomycin Other (See Comments)  GI upset  . Macrobid [Nitrofurantoin Macrocrystal] Itching  . Penicillins Itching    Has patient had a PCN reaction causing immediate rash, facial/tongue/throat swelling, SOB or lightheadedness with hypotension Unknown Has patient had a PCN reaction causing severe rash involving mucus membranes or skin necrosis: Unknown Has patient had a PCN reaction that required hospitalization: Unknown Has patient had a PCN reaction occurring within the last 10 years:Unknown If all of the above answers are "NO", then may proceed with Cephalosporin use.   . Sulfa Antibiotics Itching  . Zofran [Ondansetron Hcl] Hives    Review of Systems  She  presents for evaluation sternal fracture found as an incidental finding on a CT scan when she was hospitalized in late September for heart failure and pneumonia  BP 121/80   Pulse 89   Ht 5' (1.524 m)   Wt 89 lb (40.4 kg)   SpO2 94%   BMI 17.38 kg/m  Physical Exam      Exam    General- alert and comfortable, fragile elderly female sitting in a wheelchair   Lungs- clear without rales, wheezes   Chest wall-pectus deformity of the sternum but no instability or point tenderness   Cor-irregular rhythm, possible A. Fib,, no murmur , gallop   Abdomen- soft, non-tender   Extremities - warm, non-tender, minimal edema   Neuro- oriented, appropriate, no focal weakness   Diagnostic Tests: Images ofCT scan from September and chest x-ray performed today both personally reviewed and discussed with patient and daughter  Impression: Slightly displaced fracture the mid body of the sternum which appears to be healing. Patient is asymptomatic of pain.chest x-ray today shows improvement of the displaced fracture  Plan:no intervention needed. Extra calcium in her diet with Tums or Os-Cal tablets recommended for 2-3 weeks. Return as needed   Len Childs, MD [facility] [phone]

## 2017-02-28 DIAGNOSIS — Z8701 Personal history of pneumonia (recurrent): Secondary | ICD-10-CM | POA: Diagnosis not present

## 2017-02-28 DIAGNOSIS — I110 Hypertensive heart disease with heart failure: Secondary | ICD-10-CM | POA: Diagnosis not present

## 2017-02-28 DIAGNOSIS — M8008XD Age-related osteoporosis with current pathological fracture, vertebra(e), subsequent encounter for fracture with routine healing: Secondary | ICD-10-CM | POA: Diagnosis not present

## 2017-02-28 DIAGNOSIS — M8000XD Age-related osteoporosis with current pathological fracture, unspecified site, subsequent encounter for fracture with routine healing: Secondary | ICD-10-CM | POA: Diagnosis not present

## 2017-02-28 DIAGNOSIS — I509 Heart failure, unspecified: Secondary | ICD-10-CM | POA: Diagnosis not present

## 2017-02-28 DIAGNOSIS — Z9181 History of falling: Secondary | ICD-10-CM | POA: Diagnosis not present

## 2017-03-04 ENCOUNTER — Other Ambulatory Visit: Payer: Self-pay

## 2017-03-04 DIAGNOSIS — I509 Heart failure, unspecified: Secondary | ICD-10-CM | POA: Diagnosis not present

## 2017-03-04 DIAGNOSIS — Z8701 Personal history of pneumonia (recurrent): Secondary | ICD-10-CM | POA: Diagnosis not present

## 2017-03-04 DIAGNOSIS — M8000XD Age-related osteoporosis with current pathological fracture, unspecified site, subsequent encounter for fracture with routine healing: Secondary | ICD-10-CM | POA: Diagnosis not present

## 2017-03-04 DIAGNOSIS — M8008XD Age-related osteoporosis with current pathological fracture, vertebra(e), subsequent encounter for fracture with routine healing: Secondary | ICD-10-CM | POA: Diagnosis not present

## 2017-03-04 DIAGNOSIS — Z9181 History of falling: Secondary | ICD-10-CM | POA: Diagnosis not present

## 2017-03-04 DIAGNOSIS — I110 Hypertensive heart disease with heart failure: Secondary | ICD-10-CM | POA: Diagnosis not present

## 2017-03-04 NOTE — Patient Outreach (Signed)
[facility]) Care Management  03/04/2017  NOU CHARD 02/05/1919 [id number]   Subjective: none  Objective: none  Assessment: 83 year old with recent admission to hospital for pneumonia/heart failure. History of HTN, TIA, hypothyroidism, colon cancer.  RNCM called to complete transition of care. No answer. HIPPA compliant message left. Per chart client completed follow up with provider regarding closed fracture of sternum-per chart area healing.  Plan: follow up next week if no return call.  [name], RN, MSN, [place] Coordinator Cell: [phone]

## 2017-03-06 DIAGNOSIS — I509 Heart failure, unspecified: Secondary | ICD-10-CM | POA: Diagnosis not present

## 2017-03-06 DIAGNOSIS — Z9181 History of falling: Secondary | ICD-10-CM | POA: Diagnosis not present

## 2017-03-06 DIAGNOSIS — I110 Hypertensive heart disease with heart failure: Secondary | ICD-10-CM | POA: Diagnosis not present

## 2017-03-06 DIAGNOSIS — M8000XD Age-related osteoporosis with current pathological fracture, unspecified site, subsequent encounter for fracture with routine healing: Secondary | ICD-10-CM | POA: Diagnosis not present

## 2017-03-06 DIAGNOSIS — Z8701 Personal history of pneumonia (recurrent): Secondary | ICD-10-CM | POA: Diagnosis not present

## 2017-03-06 DIAGNOSIS — M8008XD Age-related osteoporosis with current pathological fracture, vertebra(e), subsequent encounter for fracture with routine healing: Secondary | ICD-10-CM | POA: Diagnosis not present

## 2017-03-07 DIAGNOSIS — Z8701 Personal history of pneumonia (recurrent): Secondary | ICD-10-CM | POA: Diagnosis not present

## 2017-03-07 DIAGNOSIS — I509 Heart failure, unspecified: Secondary | ICD-10-CM | POA: Diagnosis not present

## 2017-03-07 DIAGNOSIS — Z9181 History of falling: Secondary | ICD-10-CM | POA: Diagnosis not present

## 2017-03-07 DIAGNOSIS — M8000XD Age-related osteoporosis with current pathological fracture, unspecified site, subsequent encounter for fracture with routine healing: Secondary | ICD-10-CM | POA: Diagnosis not present

## 2017-03-07 DIAGNOSIS — I110 Hypertensive heart disease with heart failure: Secondary | ICD-10-CM | POA: Diagnosis not present

## 2017-03-07 DIAGNOSIS — M8008XD Age-related osteoporosis with current pathological fracture, vertebra(e), subsequent encounter for fracture with routine healing: Secondary | ICD-10-CM | POA: Diagnosis not present

## 2017-03-08 DIAGNOSIS — Z961 Presence of intraocular lens: Secondary | ICD-10-CM | POA: Diagnosis not present

## 2017-03-08 DIAGNOSIS — H5203 Hypermetropia, bilateral: Secondary | ICD-10-CM | POA: Diagnosis not present

## 2017-03-08 DIAGNOSIS — H353133 Nonexudative age-related macular degeneration, bilateral, advanced atrophic without subfoveal involvement: Secondary | ICD-10-CM | POA: Diagnosis not present

## 2017-03-11 DIAGNOSIS — I110 Hypertensive heart disease with heart failure: Secondary | ICD-10-CM | POA: Diagnosis not present

## 2017-03-11 DIAGNOSIS — Z9181 History of falling: Secondary | ICD-10-CM | POA: Diagnosis not present

## 2017-03-11 DIAGNOSIS — M8008XD Age-related osteoporosis with current pathological fracture, vertebra(e), subsequent encounter for fracture with routine healing: Secondary | ICD-10-CM | POA: Diagnosis not present

## 2017-03-11 DIAGNOSIS — M8000XD Age-related osteoporosis with current pathological fracture, unspecified site, subsequent encounter for fracture with routine healing: Secondary | ICD-10-CM | POA: Diagnosis not present

## 2017-03-11 DIAGNOSIS — I509 Heart failure, unspecified: Secondary | ICD-10-CM | POA: Diagnosis not present

## 2017-03-11 DIAGNOSIS — Z8701 Personal history of pneumonia (recurrent): Secondary | ICD-10-CM | POA: Diagnosis not present

## 2017-03-13 ENCOUNTER — Other Ambulatory Visit: Payer: Self-pay

## 2017-03-13 ENCOUNTER — Telehealth: Payer: Self-pay | Admitting: Cardiology

## 2017-03-13 DIAGNOSIS — M8008XD Age-related osteoporosis with current pathological fracture, vertebra(e), subsequent encounter for fracture with routine healing: Secondary | ICD-10-CM | POA: Diagnosis not present

## 2017-03-13 DIAGNOSIS — I110 Hypertensive heart disease with heart failure: Secondary | ICD-10-CM | POA: Diagnosis not present

## 2017-03-13 DIAGNOSIS — M8000XD Age-related osteoporosis with current pathological fracture, unspecified site, subsequent encounter for fracture with routine healing: Secondary | ICD-10-CM | POA: Diagnosis not present

## 2017-03-13 DIAGNOSIS — Z8701 Personal history of pneumonia (recurrent): Secondary | ICD-10-CM | POA: Diagnosis not present

## 2017-03-13 DIAGNOSIS — Z9181 History of falling: Secondary | ICD-10-CM | POA: Diagnosis not present

## 2017-03-13 DIAGNOSIS — I509 Heart failure, unspecified: Secondary | ICD-10-CM | POA: Diagnosis not present

## 2017-03-13 NOTE — Telephone Encounter (Signed)
L m2cb for Lori [name] (daughter) left detailed message to go to the ER.

## 2017-03-13 NOTE — Patient Outreach (Signed)
[facility]) Care Management  03/13/2017  Lori [name] 1919-04-04 [id number]   RNCM called to update [facility] health nurse, Donaciano Eva, who reports that client has told her that she is up a lot at night and client is sleeping during the day. She reports she will reach out to [place], physical therapist. She confirms that she has a visi scheduled with client on tomorrow and will follow up.  RNCM informed of concern regarding potential for dehydration. Ms. [name] reports potassium level was drawn about 1.5 weeks ago and potassium was within normal range.  Plan: continue to follow.  [name], RN, MSN, [place] Coordinator Cell: [phone]

## 2017-03-13 NOTE — Patient Outreach (Signed)
[facility]) Care Management  03/13/2017  Lori [name] 01-Nov-1918 [id number]   Care Coordination: RNCM called left message regarding daughter reports of client with less energy. Also informed that per report client does not sleep through the night and sleeps during the day. Also informed of reported weights and dates  lasix given. [facility] home health nurse scheduled to see client tomorrow and primary care visit scheduled for Nov. 1.  Per office staff, she will give message to the nurse.  Plan: continue to follow.  [name], RN, MSN, [place] Coordinator Cell: [phone]

## 2017-03-13 NOTE — Telephone Encounter (Signed)
New Message    [facility] spoke with patient daughter , daughter is concerned pt is sleeping a lot,  Pt weight was 90.6 , 93.4 , 91.6   Pt c/o Shortness Of Breath: STAT if SOB developed within the last 24 hours or pt is noticeably SOB on the phone  1. Are you currently SOB (can you hear that pt is SOB on the phone)?  Talking to nurse  2. How long have you been experiencing SOB? Today   3. Are you SOB when sitting or when up moving around? Panting per Lacey   4. Are you currently experiencing any other symptoms? They think she may be dehydrated , they are unaware of any swelling , but daughter is giving pt lasik , elevated bp

## 2017-03-13 NOTE — Patient Outreach (Signed)
[facility]) Care Management  03/13/2017  Lori [name] [birth year] [id number]  Subjective: Daughter reports client has had less energy today than usual.  Objective: none  Assessment: 83 year old with recent admission to hospital for pneumonia/heart failure. History of HTN, TIA, hypothyroidism, colon cancer. Active with home health [facility] nursing and physical therapy.  RNCM called to follow up. Lori [name] was asleep. RNCM spoke with client's daughter, Lori [name]. She reports home health physical therapist has been out to see client today, but client is a little more sleepy, with less energy today. She reports physical therapist worked with client a little today and would reports today's visit with client's home health nurse, Lori [name]. Lori [name] is scheduled to see client tomorrow and Client has an appointment with her primary care on Friday, November 2.   RNCM discussed weights-per her daughter: Oct 25th client's weight was 90.6 pounds; Oct 29th weight 93.4 pounds. Per daughter client was given Lasix 20 mg; Oct 31 weight 91.6. Daughter reports client has also been having some increased shortness of breath, "panting some" today. PRN dose of lasix 20mg   given on: October  5, 8,18, 20 , 29  and 31. Client last seen by cardiology on October 9th.  RNCM discussed importance avoiding dehydration.Lori [name] states client's appetite is better and she is drinking about 4-5 8-ounce glasses/day.  Lori [name] also states client is up and down during the night and may be sleeping more during the day. She reports yesterday client had an outing outside the home.   Daughter reports they are in the process of transitioning client to assisted living facility, [facility].  Plan: RNCM will follow up with home health nurse. Update cardiologist. Continue to follow.  Lori Silversmith, RN, MSN, [place] Coordinator Cell: [phone]

## 2017-03-14 DIAGNOSIS — M8008XD Age-related osteoporosis with current pathological fracture, vertebra(e), subsequent encounter for fracture with routine healing: Secondary | ICD-10-CM | POA: Diagnosis not present

## 2017-03-14 DIAGNOSIS — I509 Heart failure, unspecified: Secondary | ICD-10-CM | POA: Diagnosis not present

## 2017-03-14 DIAGNOSIS — I110 Hypertensive heart disease with heart failure: Secondary | ICD-10-CM | POA: Diagnosis not present

## 2017-03-14 DIAGNOSIS — M8000XD Age-related osteoporosis with current pathological fracture, unspecified site, subsequent encounter for fracture with routine healing: Secondary | ICD-10-CM | POA: Diagnosis not present

## 2017-03-14 DIAGNOSIS — Z8701 Personal history of pneumonia (recurrent): Secondary | ICD-10-CM | POA: Diagnosis not present

## 2017-03-14 DIAGNOSIS — Z9181 History of falling: Secondary | ICD-10-CM | POA: Diagnosis not present

## 2017-03-14 NOTE — Telephone Encounter (Signed)
Pt/daughter did not call back or go to the ER

## 2017-03-15 ENCOUNTER — Encounter: Payer: Self-pay | Admitting: Family Medicine

## 2017-03-15 ENCOUNTER — Ambulatory Visit (INDEPENDENT_AMBULATORY_CARE_PROVIDER_SITE_OTHER): Payer: Medicare Other | Admitting: Family Medicine

## 2017-03-15 ENCOUNTER — Telehealth: Payer: Self-pay | Admitting: *Deleted

## 2017-03-15 VITALS — BP 104/60 | HR 90 | Ht 59.0 in | Wt 92.1 lb

## 2017-03-15 DIAGNOSIS — E039 Hypothyroidism, unspecified: Secondary | ICD-10-CM

## 2017-03-15 DIAGNOSIS — Z111 Encounter for screening for respiratory tuberculosis: Secondary | ICD-10-CM | POA: Diagnosis not present

## 2017-03-15 DIAGNOSIS — E440 Moderate protein-calorie malnutrition: Secondary | ICD-10-CM

## 2017-03-15 DIAGNOSIS — I10 Essential (primary) hypertension: Secondary | ICD-10-CM

## 2017-03-15 DIAGNOSIS — Z0000 Encounter for general adult medical examination without abnormal findings: Secondary | ICD-10-CM

## 2017-03-15 DIAGNOSIS — I5040 Unspecified combined systolic (congestive) and diastolic (congestive) heart failure: Secondary | ICD-10-CM | POA: Diagnosis not present

## 2017-03-15 DIAGNOSIS — R627 Adult failure to thrive: Secondary | ICD-10-CM | POA: Diagnosis not present

## 2017-03-15 DIAGNOSIS — I340 Nonrheumatic mitral (valve) insufficiency: Secondary | ICD-10-CM | POA: Diagnosis not present

## 2017-03-15 LAB — TSH: TSH: 11.51 u[IU]/mL — AB (ref 0.35–4.50)

## 2017-03-15 NOTE — Progress Notes (Signed)
Subjective:   Lori [name] is a 83 y.o. female who presents for Medicare Annual (Subsequent) preventive examination.  The Patient was informed that the wellness visit is to identify future health risk and educate and initiate measures that can reduce risk for increased disease through the lifespan.    Annual Wellness Assessment  Reports health as   Preventive Screening -Counseling & Management  Medicare Annual Preventive Care Visit - Subsequent Last OV is today  Describes Health as poor, fair, good or great? Fair Tired today  VS reviewed;   Diet  Eats a lot of vegetables and fruits 3 meals dtr states appetite is better   BMI 18.6  Exercise Falls risk  Dtr states she fell may 29th; no falls since  [facility] following for mild CHF  Also has a PT [facility]    Stressors: not really Not depressed  Few changes over the last few months Arranging move to friend's home in the next couple of week s  Sleep patterns: sleeps well  Dtr states she has been very restless at hs   Pain - did not verbalize    Cardiac Risk Factors Addressed Hyperlipidemia - no issue Diabetes neg Hx CHF taking lasix  Obesity  Advanced Directives completed  Patient Care Team: Lucretia Kern, DO as PCP - General (Family Medicine) Regal, Tamala Fothergill, DPM as Consulting Physician (Podiatry) Almedia Balls, MD as Attending Physician (Orthopedic Surgery) Luretha Rued, RN as [place] Management     Objective:     Vitals: BP 104/60   Pulse 90   Ht 4\' 11"  (1.499 m)   Wt 92 lb 2 oz (41.8 kg)   SpO2 93%   BMI 18.61 kg/m   Body mass index is 18.61 kg/m.   Tobacco History  Smoking Status  . Never Smoker  Smokeless Tobacco  . Never Used     Counseling given: Yes   Past Medical History:  Diagnosis Date  . Colon cancer ([place]) 2009   s/p colon resection  . Endometrial cancer ([place]) 1989   endometrial s/p hysterectomy, chemo and radiation  . Hearing loss    . History of blood transfusion   . Hypertension   . Macular degeneration   . Migraines   . Pneumonia   . Positive TB test   . Stroke [facility]) 2004   ? TIA, brief episode of speech issues - evaluate OH   Past Surgical History:  Procedure Laterality Date  . ABDOMINAL HYSTERECTOMY  1989  . APPENDECTOMY    . COLON RESECTION  08/27/2007  . GALLBLADDER SURGERY  1970  . SUBDURAL HEMATOMA EVACUATION VIA CRANIOTOMY  2002  . TONSILLECTOMY AND ADENOIDECTOMY  1943   Family History  Problem Relation Age of Onset  . Arthritis Mother   . Heart disease Father   . Hyperlipidemia Father   . Hypertension Father   . Diabetes Sister   . Crohn's disease Daughter    History  Sexual Activity  . Sexual activity: Not on file    Outpatient Encounter Prescriptions as of 03/15/2017  Medication Sig  . acetaminophen (TYLENOL) 500 MG tablet Take 500 mg by mouth as needed.  . Cholecalciferol (VITAMIN D PO) Take 1 capsule by mouth daily.   . furosemide (LASIX) 20 MG tablet Take 1 tablet (20 mg total) by mouth daily as needed. For swelling or shortness of breath  . levothyroxine (SYNTHROID, LEVOTHROID) 50 MCG tablet TAKE 2 TABLETS ON MONDAY AND FRIDAY. TAKE  1 TABLET ALL OTHER DAYS  . lisinopril (PRINIVIL,ZESTRIL) 20 MG tablet TAKE 1 TABLET (20 MG TOTAL) BY MOUTH DAILY.  . Multiple Vitamins-Minerals (EYE VITAMINS PO) Take 1 tablet by mouth 2 (two) times daily.   . Riboflavin (VITAMIN B-2 PO) Take 1 tablet by mouth daily.   Marland Kitchen triamcinolone cream (KENALOG) 0.1 % Apply 1 application topically 2 (two) times daily.   No facility-administered encounter medications on file as of 03/15/2017.     Activities of Daily Living In your present state of health, do you have any difficulty performing the following activities: 03/15/2017 02/14/2017  Hearing? Tempie Donning  Vision? Y N  Difficulty concentrating or making decisions? - Y  Walking or climbing stairs? Y Y  Dressing or bathing? N N  Comment transfer bench late entry  added does patient have difficulty no  Doing errands, shopping? Y Y  Comment - late entry added does patient have difficulty doing errands alone yes  Preparing Food and eating ? Y N  Using the Toilet? N N  In the past six months, have you accidently leaked urine? N N  Do you have problems with loss of bowel control? N N  Managing your Medications? Y N  Comment sometimes missing doses daughter assist  Managing your Finances? Tempie Donning  Housekeeping or managing your Housekeeping? Tempie Donning  Some recent data might be hidden    Patient Care Team: Lucretia Kern, DO as PCP - General (Family Medicine) Regal, Tamala Fothergill, DPM as Consulting Physician (Podiatry) Almedia Balls, MD as Attending Physician (Orthopedic Surgery) Luretha Rued, RN as [place] Management    Assessment:     Exercise Activities and Dietary recommendations    Goals    . Exercise 150 minutes per week (moderate activity)          Works on balance;  Showed her some balance exercises to do with the wall for support;  Can use soup cans with exercise upper body Also getting up and down from a chair  All very slow with good support as demonstrated       . patient          Take care of self and enjoy your new home!      Fall Risk Fall Risk  03/15/2017 02/14/2017 03/09/2016 01/12/2016 08/09/2014  Falls in the past year? Yes Yes No No No  Comment - - - [place] Telephone Survey: data to providers prior to load -  Number falls in past yr: 1 1 - - -  Injury with Fall? Yes Yes - - -  Risk for fall due to : - History of fall(s) - - -  Follow up Education provided Falls prevention discussed;Education provided - - -   Depression Screen PHQ 2/9 Scores 03/15/2017 02/14/2017 03/09/2016 08/09/2014  PHQ - 2 Score 0 0 0 0    Denies  Cognitive Function MMSE - Mini Mental State Exam 03/15/2017 03/09/2016  Not completed: Refused (No Data)  Orientation to time 4 -  Orientation to Place 4 -  Registration 3 -  Attention/  Calculation 5 -  Attention/Calculation-comments world backwards -  Recall 2 -  Language- name 2 objects 2 -  Language- repeat 1 -  Language- follow 3 step command 3 -  Language- read & follow direction 1 -  Write a sentence 1 -  Copy design 0 -  Copy design-comments vision a factor -  Total score 26 -     to note;  vision impaired and did not copy the picture Score is 27/29  Spelled world backwards.  Minor issues but is moving into an AL in Friend's home [place] and will be very appropriate there. Was a long term bridge player Socialization will help    Immunization History  Administered Date(s) Administered  . Influenza, High Dose Seasonal PF 03/16/2011, 02/21/2012, 03/10/2015, 02/06/2016, 02/01/2017  . Influenza,inj,Quad PF,6+ Mos 01/30/2013, 02/05/2014  . Pneumococcal Conjugate-13 02/05/2014  . Pneumococcal Polysaccharide-23 05/14/2000, 01/30/2013  . Tdap 01/30/2013   Screening Tests Health Maintenance  Topic Date Due  . DEXA SCAN  08/09/2019 (Originally 06/09/1985)  . TETANUS/TDAP  01/31/2023  . INFLUENZA VACCINE  Completed  . PNA vac Low Risk Adult  Completed      Plan:      PCP Notes   Health Maintenance Up to date Educated regarding shingrix   Abnormal Screens  Weight Is 92; BMI 17  Encouraged to drink shakes and supplement meals   Referrals  [facility] referral in process for CHF PT working on balance   Patient concerns; None voiced   Nurse Concerns; As noted   Next PCP apt today    I have personally reviewed and noted the following in the patient's chart:   . Medical and social history . Use of alcohol, tobacco or illicit drugs  . Current medications and supplements . Functional ability and status . Nutritional status . Physical activity . Advanced directives . List of other physicians . Hospitalizations, surgeries, and ER visits in previous 12 months . Vitals . Screenings to include cognitive, depression, and falls . Referrals and  appointments  In addition, I have reviewed and discussed with patient certain preventive protocols, quality metrics, and best practice recommendations. A written personalized care plan for preventive services as well as general preventive health recommendations were provided to patient.     Colin Benton R., DO  03/15/2017  Lucretia Kern., DO

## 2017-03-15 NOTE — Telephone Encounter (Signed)
Lmecb

## 2017-03-15 NOTE — Progress Notes (Signed)
HPI:  Lori [name] is a very pleasant 83 yo with hypothyroidism, macular degeneration, MR, HTN, hx colon ca, admission to hospital 01/2017 for CAP and CHF, FTT, ischemic colitis, compression fxs here for follow up. Placed a palliative referral at last visit per family request. She had elevated potassium on recent BMET with cardiologist - recheck was advised. Daughter reports the home health nurse rechecked this and the results were sent to the cardiologist and they were told that it was fine on recheck. The daughter has several things she would like to discuss today. The patient has not been taking her lisinopril and her blood pressure has been fine at home in the 100s to 120s over 70s on average. She brings a log with her. They would like to hold off on taking this medication if possible. No dizziness or chest pain or increased shortness of breath. They are watching her weights very carefully and they give the Lasix a few days per week whenever her weight goes up by a pound or 2 or she develops more swelling in her ankles. Her daughter also reports she has not been taking her thyroid medicine for a little over a month and they wonder if she even needs this. Her daughter would like for her to have a TB skin test as she is considering moving to friend's home. They never heard about an appointment with the palliative care team and the daughter would still like to do this as they're considering all options in terms of her care moving forward. They are happy that her appetite has improved and she is eating better. Her weight has come up a little bit gradually secondary to improved appetite. She continues to be quite thin and frail however. She sleeps a lot. No falls recently. She is grateful for her daughter's care. She feels her mood. She has trouble sleeping at night.  Lori [name] today for her preventive visit.  ROS: See pertinent positives and negatives per HPI.  Past Medical History:  Diagnosis Date  . Colon  cancer ([place]) 2009   s/p colon resection  . Endometrial cancer ([place]) 1989   endometrial s/p hysterectomy, chemo and radiation  . Hearing loss   . History of blood transfusion   . Hypertension   . Macular degeneration   . Migraines   . Pneumonia   . Positive TB test   . Stroke [facility]) 2004   ? TIA, brief episode of speech issues - evaluate [place]    Past Surgical History:  Procedure Laterality Date  . ABDOMINAL HYSTERECTOMY  1989  . APPENDECTOMY    . COLON RESECTION  08/27/2007  . GALLBLADDER SURGERY  1970  . SUBDURAL HEMATOMA EVACUATION VIA CRANIOTOMY  2002  . TONSILLECTOMY AND ADENOIDECTOMY  1943    Family History  Problem Relation Age of Onset  . Arthritis Mother   . Heart disease Father   . Hyperlipidemia Father   . Hypertension Father   . Diabetes Sister   . Crohn's disease Daughter     Social History   Social History  . Marital status: Widowed    Spouse name: N/A  . Number of children: 2  . Years of education: N/A   Occupational History  . retired    Social History Main Topics  . Smoking status: Never Smoker  . Smokeless tobacco: Never Used  . Alcohol use Yes     Comment: occ glass of wine- one every 2 months   . Drug use: No  .  Sexual activity: Not on file   Other Topics Concern  . 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  acetaminophen (TYLENOL) 500 MG tablet, Take 500 mg by mouth as needed., Disp: , Rfl:  .  Cholecalciferol (VITAMIN D PO), Take 1 capsule by mouth daily. , Disp: , Rfl:  .  furosemide (LASIX) 20 MG tablet, Take 1 tablet (20 mg total) by mouth daily as needed. For swelling or shortness of breath, Disp: 30 tablet, Rfl: 0 .  levothyroxine (SYNTHROID, LEVOTHROID) 50 MCG tablet, TAKE 2 TABLETS ON MONDAY AND FRIDAY. TAKE 1 TABLET ALL  OTHER DAYS, Disp: 144 tablet, Rfl: 1 .  lisinopril (PRINIVIL,ZESTRIL) 20 MG tablet, TAKE 1 TABLET (20 MG TOTAL) BY MOUTH DAILY., Disp: 90 tablet, Rfl: 1 .  Multiple Vitamins-Minerals (EYE VITAMINS PO), Take 1 tablet by mouth 2 (two) times daily. , Disp: , Rfl:  .  Riboflavin (VITAMIN B-2 PO), Take 1 tablet by mouth daily. , Disp: , Rfl:  .  triamcinolone cream (KENALOG) 0.1 %, Apply 1 application topically 2 (two) times daily., Disp: 30 g, Rfl: 0  EXAM:  Vitals:   03/15/17 1004  BP: 104/60  Pulse: 90  SpO2: 93%    Body mass index is 18.61 kg/m.  GENERAL: vitals reviewed and listed above, alert, oriented, appears well hydrated and in no acute distress  HEENT: atraumatic, conjunttiva clear, no obvious abnormalities on inspection of external nose and ears  NECK: no obvious masses on inspection  LUNGS: clear to auscultation bilaterally, no wheezes, rales or rhonchi, good air movement  CV: HRRR, some edema in the feet, her legs look good  MS: moves all extremities without noticeable abnormality  PSYCH: pleasant and cooperative, no obvious depression or anxiety  ASSESSMENT AND PLAN:  Discussed the following assessment and plan:  Hypertension, unspecified type  Combined systolic and diastolic congestive heart failure, unspecified HF chronicity (HCC)  Moderate to severe mitral regurgitation  Hypothyroidism, unspecified type - Plan: TSH  FTT (failure to thrive) in adult  Moderate protein-calorie malnutrition ([place])  -Since she hasn't been taking the lisinopril, will take this off her medication list, did advise that they let her cardiologist know  -We will check a TSH today to see if she still needs the thyroid medication  -I glad that her appetite is improving, she still appears quite thin and frail, discussed healthy nutrition  -TB skin test today  -We'll have my assistant check on the palliative referral  - per chart this referral was placed and approved sometime ago   -Patient advised to return or notify a doctor immediately if symptoms worsen or persist or new concerns arise.  Patient Instructions   BEFORE YOU LEAVE: -Wendie Simmer, check on palliative referral -TB skin test -lab for thyroid check -follow up: 3-4 months (30 minutes)  Please let us know if you have not heard about the palliative care appointment in the next 1-2 weeks  Do not take the lisinopril any longer unless told to do so by her cardiologist - let cardiologist know you are not taking it.  We have ordered a thyroid check at this visit. It can take up to 1-2 weeks for results and processing.  IF results require follow up or explanation, we will call you with instructions. Clinically stable results will be released to your [facility]. If you have not heard from [place] or cannot find your results in [facility] in 2 weeks please contact our office at [phone].  If you are not yet signed up for [facility], please consider signing up.    Ms. Dowler , Thank you for taking time to come for your Medicare Wellness Visit. I appreciate your ongoing commitment to your health goals. Please review the following plan we discussed and let me know if I can assist you in the future.   Shingrix is a vaccine for the prevention of Shingles in Adults 50 and older.  If you are on Medicare, you can request a prescription from your doctor to be filled at a pharmacy.  Please check with your benefits regarding applicable copays or out of pocket expenses.  The Shingrix is given in 2 vaccines approx 8 weeks apart. You must receive the 2nd dose prior to 6 months from receipt of the first.   Get audio books from the [facility] [employer] Low vision specialist  Refer: [facility] [place] Served: [place], [place], [place], [employer], [place], [place], [place], [place], [place], [place], [place], [place], [place], [place], [place], [place] Phone: [phone] [address] in [place]; [phone] Toll  Free: [phone] Fax: [phone] Email: [email or web address] Physical Address [address] [place], [place] [address]    These are the goals we discussed: Goals    . Exercise 150 minutes per week (moderate activity)          Works on balance;  Showed her some balance exercises to do with the wall for support;  Can use soup cans with exercise upper body Also getting up and down from a chair  All very slow with good support as demonstrated          This is a list of the screening recommended for you and due dates:  Health Maintenance  Topic Date Due  . DEXA scan (bone density measurement)  08/09/2019*  . Tetanus Vaccine  01/31/2023  . Flu Shot  Completed  . Pneumonia vaccines  Completed  *Topic was postponed. The date shown is not the original due date.   Health Maintenance, Female Adopting a healthy lifestyle and getting preventive care can go a long way to promote health and wellness. Talk with your health care provider about what schedule of regular examinations is right for you. This is a good chance for you to check in with your provider about disease prevention and staying healthy. In between checkups, there are plenty of things you can do on your own. Experts have done a lot of research about which lifestyle changes and preventive measures are most likely to keep you healthy. Ask your health care provider for more information. Weight and diet Eat a healthy diet  Be sure to include plenty of vegetables, fruits, low-fat dairy products, and lean protein.  Do not eat a lot of foods high in solid fats, added sugars, or salt.  Get regular exercise. This is one of the most important things you can do for your health. ? Most adults should exercise for at least 150 minutes each week. The exercise should increase your heart rate and make you sweat (moderate-intensity exercise). ? Most adults should also do strengthening exercises at least twice a  week. This is in addition to the moderate-intensity exercise.  Maintain a healthy weight  Body mass  index (BMI) is a measurement that can be used to identify possible weight problems. It estimates body fat based on height and weight. Your health care provider can help determine your BMI and help you achieve or maintain a healthy weight.  For females 56 years of age and older: ? A BMI below 18.5 is considered underweight. ? A BMI of 18.5 to 24.9 is normal. ? A BMI of 25 to 29.9 is considered overweight. ? A BMI of 30 and above is considered obese.  Watch levels of cholesterol and blood lipids  You should start having your blood tested for lipids and cholesterol at 81 years of age, then have this test every 5 years.  You may need to have your cholesterol levels checked more often if: ? Your lipid or cholesterol levels are high. ? You are older than 81 years of age. ? You are at high risk for heart disease.  Cancer screening Lung Cancer  Lung cancer screening is recommended for adults 38-30 years old who are at high risk for lung cancer because of a history of smoking.  A yearly low-dose CT scan of the lungs is recommended for people who: ? Currently smoke. ? Have quit within the past 15 years. ? Have at least a 30-pack-year history of smoking. A pack year is smoking an average of one pack of cigarettes a day for 1 year.  Yearly screening should continue until it has been 15 years since you quit.  Yearly screening should stop if you develop a health problem that would prevent you from having lung cancer treatment.  Breast Cancer  Practice breast self-awareness. This means understanding how your breasts normally appear and feel.  It also means doing regular breast self-exams. Let your health care provider know about any changes, no matter how small.  If you are in your 20s or 30s, you should have a clinical breast exam (CBE) by a health care provider every 1-3 years as part of a  regular health exam.  If you are 51 or older, have a CBE every year. Also consider having a breast X-ray (mammogram) every year.  If you have a family history of breast cancer, talk to your health care provider about genetic screening.  If you are at high risk for breast cancer, talk to your health care provider about having an MRI and a mammogram every year.  Breast cancer gene (BRCA) assessment is recommended for women who have family members with BRCA-related cancers. BRCA-related cancers include: ? Breast. ? Ovarian. ? Tubal. ? Peritoneal cancers.  Results of the assessment will determine the need for genetic counseling and BRCA1 and BRCA2 testing.  Cervical Cancer Your health care provider may recommend that you be screened regularly for cancer of the pelvic organs (ovaries, uterus, and vagina). This screening involves a pelvic examination, including checking for microscopic changes to the surface of your cervix (Pap test). You may be encouraged to have this screening done every 3 years, beginning at age 76.  For women ages 75-65, health care providers may recommend pelvic exams and Pap testing every 3 years, or they may recommend the Pap and pelvic exam, combined with testing for human papilloma virus (HPV), every 5 years. Some types of HPV increase your risk of cervical cancer. Testing for HPV may also be done on women of any age with unclear Pap test results.  Other health care providers may not recommend any screening for nonpregnant women who are considered low risk for pelvic cancer  and who do not have symptoms. Ask your health care provider if a screening pelvic exam is right for you.  If you have had past treatment for cervical cancer or a condition that could lead to cancer, you need Pap tests and screening for cancer for at least 20 years after your treatment. If Pap tests have been discontinued, your risk factors (such as having a new sexual partner) need to be reassessed to  determine if screening should resume. Some women have medical problems that increase the chance of getting cervical cancer. In these cases, your health care provider may recommend more frequent screening and Pap tests.  Colorectal Cancer  This type of cancer can be detected and often prevented.  Routine colorectal cancer screening usually begins at 81 years of age and continues through 81 years of age.  Your health care provider may recommend screening at an earlier age if you have risk factors for colon cancer.  Your health care provider may also recommend using home test kits to check for hidden blood in the stool.  A small camera at the end of a tube can be used to examine your colon directly (sigmoidoscopy or colonoscopy). This is done to check for the earliest forms of colorectal cancer.  Routine screening usually begins at age 56.  Direct examination of the colon should be repeated every 5-10 years through 81 years of age. However, you may need to be screened more often if early forms of precancerous polyps or small growths are found.  Skin Cancer  Check your skin from head to toe regularly.  Tell your health care provider about any new moles or changes in moles, especially if there is a change in a mole's shape or color.  Also tell your health care provider if you have a mole that is larger than the size of a pencil eraser.  Always use sunscreen. Apply sunscreen liberally and repeatedly throughout the day.  Protect yourself by wearing long sleeves, pants, a wide-brimmed hat, and sunglasses whenever you are outside.  Heart disease, diabetes, and high blood pressure  High blood pressure causes heart disease and increases the risk of stroke. High blood pressure is more likely to develop in: ? People who have blood pressure in the high end of the normal range (130-139/85-89 mm Hg). ? People who are overweight or obese. ? People who are African American.  If you are 18-39 years  of age, have your blood pressure checked every 3-5 years. If you are 51 years of age or older, have your blood pressure checked every year. You should have your blood pressure measured twice-once when you are at a hospital or clinic, and once when you are not at a hospital or clinic. Record the average of the two measurements. To check your blood pressure when you are not at a hospital or clinic, you can use: ? An automated blood pressure machine at a pharmacy. ? A home blood pressure monitor.  If you are between 40 years and 27 years old, ask your health care provider if you should take aspirin to prevent strokes.  Have regular diabetes screenings. This involves taking a blood sample to check your fasting blood sugar level. ? If you are at a normal weight and have a low risk for diabetes, have this test once every three years after 81 years of age. ? If you are overweight and have a high risk for diabetes, consider being tested at a younger age or more often. Preventing  infection Hepatitis B  If you have a higher risk for hepatitis B, you should be screened for this virus. You are considered at high risk for hepatitis B if: ? You were born in a country where hepatitis B is common. Ask your health care provider which countries are considered high risk. ? Your parents were born in a high-risk country, and you have not been immunized against hepatitis B (hepatitis B vaccine). ? You have HIV or AIDS. ? You use needles to inject street drugs. ? You live with someone who has hepatitis B. ? You have had sex with someone who has hepatitis B. ? You get hemodialysis treatment. ? You take certain medicines for conditions, including cancer, organ transplantation, and autoimmune conditions.  Hepatitis C  Blood testing is recommended for: ? Everyone born from [birth year] through 1963. ? Anyone with known risk factors for hepatitis C.  Sexually transmitted infections (STIs)  You should be screened for  sexually transmitted infections (STIs) including gonorrhea and chlamydia if: ? You are sexually active and are younger than 81 years of age. ? You are older than 81 years of age and your health care provider tells you that you are at risk for this type of infection. ? Your sexual activity has changed since you were last screened and you are at an increased risk for chlamydia or gonorrhea. Ask your health care provider if you are at risk.  If you do not have HIV, but are at risk, it may be recommended that you take a prescription medicine daily to prevent HIV infection. This is called pre-exposure prophylaxis (PrEP). You are considered at risk if: ? You are sexually active and do not regularly use condoms or know the HIV status of your partner(s). ? You take drugs by injection. ? You are sexually active with a partner who has HIV.  Talk with your health care provider about whether you are at high risk of being infected with HIV. If you choose to begin PrEP, you should first be tested for HIV. You should then be tested every 3 months for as long as you are taking PrEP. Pregnancy  If you are premenopausal and you may become pregnant, ask your health care provider about preconception counseling.  If you may become pregnant, take 400 to 800 micrograms (mcg) of folic acid every day.  If you want to prevent pregnancy, talk to your health care provider about birth control (contraception). Osteoporosis and menopause  Osteoporosis is a disease in which the bones lose minerals and strength with aging. This can result in serious bone fractures. Your risk for osteoporosis can be identified using a bone density scan.  If you are 41 years of age or older, or if you are at risk for osteoporosis and fractures, ask your health care provider if you should be screened.  Ask your health care provider whether you should take a calcium or vitamin D supplement to lower your risk for osteoporosis.  Menopause may  have certain physical symptoms and risks.  Hormone replacement therapy may reduce some of these symptoms and risks. Talk to your health care provider about whether hormone replacement therapy is right for you. Follow these instructions at home:  Schedule regular health, dental, and eye exams.  Stay current with your immunizations.  Do not use any tobacco products including cigarettes, chewing tobacco, or electronic cigarettes.  If you are pregnant, do not drink alcohol.  If you are breastfeeding, limit how much and how often you drink  alcohol.  Limit alcohol intake to no more than 1 drink per day for nonpregnant women. One drink equals 12 ounces of beer, 5 ounces of wine, or 1 ounces of hard liquor.  Do not use street drugs.  Do not share needles.  Ask your health care provider for help if you need support or information about quitting drugs.  Tell your health care provider if you often feel depressed.  Tell your health care provider if you have ever been abused or do not feel safe at home. This information is not intended to replace advice given to you by your health care provider. Make sure you discuss any questions you have with your health care provider. Document Released: 11/13/2010 Document Revised: 10/06/2015 Document Reviewed: 02/01/2015 Elsevier Interactive Patient Education  2018 [place] in [place] Falls can cause injuries and can affect people from all age groups. There are many simple things that you can do to make your home safe and to help prevent falls. What can I do on the outside of my home?  Regularly repair the edges of walkways and driveways and fix any cracks.  Remove high doorway thresholds.  Trim any shrubbery on the main path into your home.  Use bright outdoor lighting.  Clear walkways of debris and clutter, including tools and rocks.  Regularly check that handrails are securely fastened and in good repair. Both sides  of any steps should have handrails.  Install guardrails along the edges of any raised decks or porches.  Have leaves, snow, and ice cleared regularly.  Use sand or salt on walkways during winter months.  In the garage, clean up any spills right away, including grease or oil spills. What can I do in the bathroom?  Use night lights.  Install grab bars by the toilet and in the tub and shower. Do not use towel bars as grab bars.  Use non-skid mats or decals on the floor of the tub or shower.  If you need to sit down while you are in the shower, use a plastic, non-slip stool.  Keep the floor dry. Immediately clean up any water that spills on the floor.  Remove soap buildup in the tub or shower on a regular basis.  Attach bath mats securely with double-sided non-slip rug tape.  Remove throw rugs and other tripping hazards from the floor. What can I do in the bedroom?  Use night lights.  Make sure that a bedside light is easy to reach.  Do not use oversized bedding that drapes onto the floor.  Have a firm chair that has side arms to use for getting dressed.  Remove throw rugs and other tripping hazards from the floor. What can I do in the kitchen?  Clean up any spills right away.  Avoid walking on wet floors.  Place frequently used items in easy-to-reach places.  If you need to reach for something above you, use a sturdy step stool that has a grab bar.  Keep electrical cables out of the way.  Do not use floor polish or wax that makes floors slippery. If you have to use wax, make sure that it is non-skid floor wax.  Remove throw rugs and other tripping hazards from the floor. What can I do in the stairways?  Do not leave any items on the stairs.  Make sure that there are handrails on both sides of the stairs. Fix handrails that are broken or loose. Make sure that handrails  are as long as the stairways.  Check any carpeting to make sure that it is firmly attached to  the stairs. Fix any carpet that is loose or worn.  Avoid having throw rugs at the top or bottom of stairways, or secure the rugs with carpet tape to prevent them from moving.  Make sure that you have a light switch at the top of the stairs and the bottom of the stairs. If you do not have them, have them installed. What are some other fall prevention tips?  Wear closed-toe shoes that fit well and support your feet. Wear shoes that have rubber soles or low heels.  When you use a stepladder, make sure that it is completely opened and that the sides are firmly locked. Have someone hold the ladder while you are using it. Do not climb a closed stepladder.  Add color or contrast paint or tape to grab bars and handrails in your home. Place contrasting color strips on the first and last steps.  Use mobility aids as needed, such as canes, walkers, scooters, and crutches.  Turn on lights if it is dark. Replace any light bulbs that burn out.  Set up furniture so that there are clear paths. Keep the furniture in the same spot.  Fix any uneven floor surfaces.  Choose a carpet design that does not hide the edge of steps of a stairway.  Be aware of any and all pets.  Review your medicines with your healthcare provider. Some medicines can cause dizziness or changes in blood pressure, which increase your risk of falling. Talk with your health care provider about other ways that you can decrease your risk of falls. This may include working with a physical therapist or trainer to improve your strength, balance, and endurance. This information is not intended to replace advice given to you by your health care provider. Make sure you discuss any questions you have with your health care provider. Document Released: 04/20/2002 Document Revised: 09/27/2015 Document Reviewed: 06/04/2014 Elsevier Interactive Patient Education  2017 [place] DO

## 2017-03-15 NOTE — Addendum Note (Signed)
Addended by: Lahoma Crocker A on: 03/15/2017 01:00 PM   Modules accepted: Orders

## 2017-03-15 NOTE — Progress Notes (Signed)
Note created in error.

## 2017-03-15 NOTE — Telephone Encounter (Signed)
I called [facility]osp[facility] in regards to the referral that was placed for Palliative Care on 9/27 as the pt has not heard from anyone.  I left a message with Dewaine Oats and she stated she will send the pts information to the Palliative Care team and the pt was informed of this at her visit today.

## 2017-03-15 NOTE — Patient Instructions (Addendum)
BEFORE YOU LEAVE: Lori [name], check on palliative referral -TB skin test -lab for thyroid check -follow up: 3-4 months (30 minutes)  Please let us know if you have not heard about the palliative care appointment in the next 1-2 weeks  Do not take the lisinopril any longer unless told to do so by her cardiologist - let cardiologist know you are not taking it.  We have ordered a thyroid check at this visit. It can take up to 1-2 weeks for results and processing. IF results require follow up or explanation, we will call you with instructions. Clinically stable results will be released to your [facility]. If you have not heard from [place] or cannot find your results in [facility] in 2 weeks please contact our office at [phone].  If you are not yet signed up for [facility], please consider signing up.    Lori [name] , Thank you for taking time to come for your Medicare Wellness Visit. I appreciate your ongoing commitment to your health goals. Please review the following plan we discussed and let me know if I can assist you in the future.   Shingrix is a vaccine for the prevention of Shingles in Adults 50 and older.  If you are on Medicare, you can request a prescription from your doctor to be filled at a pharmacy.  Please check with your benefits regarding applicable copays or out of pocket expenses.  The Shingrix is given in 2 vaccines approx 8 weeks apart. You must receive the 2nd dose prior to 6 months from receipt of the first.   Get audio books from the [employer] [facility] [employer] Low vision specialist  Refer: [facility] [place] Served: [place], [place], [place], [place], [place], [place], [place], [place], [place], [place], [place], [place], [place], [place], [place] Phone: [phone] in [place]; [phone] Toll Free: [phone] Fax: [phone] Email: [email or web address] Physical Address [address], [place],  [place] [address]    These are the goals we discussed: Goals    . Exercise 150 minutes per week (moderate activity)          Works on balance;  Showed her some balance exercises to do with the wall for support;  Can use soup cans with exercise upper body Also getting up and down from a chair  All very slow with good support as demonstrated          This is a list of the screening recommended for you and due dates:  Health Maintenance  Topic Date Due  . DEXA scan (bone density measurement)  08/09/2019*  . Tetanus Vaccine  01/31/2023  . Flu Shot  Completed  . Pneumonia vaccines  Completed  *Topic was postponed. The date shown is not the original due date.   Health Maintenance, Female Adopting a healthy lifestyle and getting preventive care can go a long way to promote health and wellness. Talk with your health care provider about what schedule of regular examinations is right for you. This is a good chance for you to check in with your provider about disease prevention and staying healthy. In between checkups, there are plenty of things you can do on your own. Experts have done a lot of research about which lifestyle changes and preventive measures are most likely to keep you healthy. Ask your health care provider for more information. Weight and diet Eat a healthy diet  Be sure to include plenty of vegetables, fruits, low-fat dairy products, and lean protein.  Do not eat a lot of foods high in solid fats, added sugars, or salt.  Get regular exercise. This is one of the most important things you can do for your health. ? Most adults should exercise for at least 150 minutes each week. The exercise should increase your heart rate and make you sweat (moderate-intensity exercise). ? Most adults should also do strengthening exercises at least twice a week. This is in addition to the moderate-intensity exercise.  Maintain a healthy weight  Body mass index (BMI) is a measurement that can be  used to identify possible weight problems. It estimates body fat based on height and weight. Your health care provider can help determine your BMI and help you achieve or maintain a healthy weight.  For females 51 years of age and older: ? A BMI below 18.5 is considered underweight. ? A BMI of 18.5 to 24.9 is normal. ? A BMI of 25 to 29.9 is considered overweight. ? A BMI of 30 and above is considered obese.  Watch levels of cholesterol and blood lipids  You should start having your blood tested for lipids and cholesterol at 81 years of age, then have this test every 5 years.  You may need to have your cholesterol levels checked more often if: ? Your lipid or cholesterol levels are high. ? You are older than 81 years of age. ? You are at high risk for heart disease.  Cancer screening Lung Cancer  Lung cancer screening is recommended for adults 89-47 years old who are at high risk for lung cancer because of a history of smoking.  A yearly low-dose CT scan of the lungs is recommended for people who: ? Currently smoke. ? Have quit within the past 15 years. ? Have at least a 30-pack-year history of smoking. A pack year is smoking an average of one pack of cigarettes a day for 1 year.  Yearly screening should continue until it has been 15 years since you quit.  Yearly screening should stop if you develop a health problem that would prevent you from having lung cancer treatment.  Breast Cancer  Practice breast self-awareness. This means understanding how your breasts normally appear and feel.  It also means doing regular breast self-exams. Let your health care provider know about any changes, no matter how small.  If you are in your 20s or 30s, you should have a clinical breast exam (CBE) by a health care provider every 1-3 years as part of a regular health exam.  If you are 61 or older, have a CBE every year. Also consider having a breast X-ray (mammogram) every year.  If you have  a family history of breast cancer, talk to your health care provider about genetic screening.  If you are at high risk for breast cancer, talk to your health care provider about having an MRI and a mammogram every year.  Breast cancer gene (BRCA) assessment is recommended for women who have family members with BRCA-related cancers. BRCA-related cancers include: ? Breast. ? Ovarian. ? Tubal. ? Peritoneal cancers.  Results of the assessment will determine the need for genetic counseling and BRCA1 and BRCA2 testing.  Cervical Cancer Your health care provider may recommend that you be screened regularly for cancer of the pelvic organs (ovaries, uterus, and vagina). This screening involves a pelvic examination, including checking for microscopic changes to the surface of your cervix (Pap test). You may be encouraged to have this screening done every 3 years, beginning at  age 17.  For women ages 36-65, health care providers may recommend pelvic exams and Pap testing every 3 years, or they may recommend the Pap and pelvic exam, combined with testing for human papilloma virus (HPV), every 5 years. Some types of HPV increase your risk of cervical cancer. Testing for HPV may also be done on women of any age with unclear Pap test results.  Other health care providers may not recommend any screening for nonpregnant women who are considered low risk for pelvic cancer and who do not have symptoms. Ask your health care provider if a screening pelvic exam is right for you.  If you have had past treatment for cervical cancer or a condition that could lead to cancer, you need Pap tests and screening for cancer for at least 20 years after your treatment. If Pap tests have been discontinued, your risk factors (such as having a new sexual partner) need to be reassessed to determine if screening should resume. Some women have medical problems that increase the chance of getting cervical cancer. In these cases, your  health care provider may recommend more frequent screening and Pap tests.  Colorectal Cancer  This type of cancer can be detected and often prevented.  Routine colorectal cancer screening usually begins at 81 years of age and continues through 81 years of age.  Your health care provider may recommend screening at an earlier age if you have risk factors for colon cancer.  Your health care provider may also recommend using home test kits to check for hidden blood in the stool.  A small camera at the end of a tube can be used to examine your colon directly (sigmoidoscopy or colonoscopy). This is done to check for the earliest forms of colorectal cancer.  Routine screening usually begins at age 58.  Direct examination of the colon should be repeated every 5-10 years through 81 years of age. However, you may need to be screened more often if early forms of precancerous polyps or small growths are found.  Skin Cancer  Check your skin from head to toe regularly.  Tell your health care provider about any new moles or changes in moles, especially if there is a change in a mole's shape or color.  Also tell your health care provider if you have a mole that is larger than the size of a pencil eraser.  Always use sunscreen. Apply sunscreen liberally and repeatedly throughout the day.  Protect yourself by wearing long sleeves, pants, a wide-brimmed hat, and sunglasses whenever you are outside.  Heart disease, diabetes, and high blood pressure  High blood pressure causes heart disease and increases the risk of stroke. High blood pressure is more likely to develop in: ? People who have blood pressure in the high end of the normal range (130-139/85-89 mm Hg). ? People who are overweight or obese. ? People who are African American.  If you are 48-45 years of age, have your blood pressure checked every 3-5 years. If you are 13 years of age or older, have your blood pressure checked every year. You  should have your blood pressure measured twice-once when you are at a hospital or clinic, and once when you are not at a hospital or clinic. Record the average of the two measurements. To check your blood pressure when you are not at a hospital or clinic, you can use: ? An automated blood pressure machine at a pharmacy. ? A home blood pressure monitor.  If you are  between 61 years and 61 years old, ask your health care provider if you should take aspirin to prevent strokes.  Have regular diabetes screenings. This involves taking a blood sample to check your fasting blood sugar level. ? If you are at a normal weight and have a low risk for diabetes, have this test once every three years after 81 years of age. ? If you are overweight and have a high risk for diabetes, consider being tested at a younger age or more often. Preventing infection Hepatitis B  If you have a higher risk for hepatitis B, you should be screened for this virus. You are considered at high risk for hepatitis B if: ? You were born in a country where hepatitis B is common. Ask your health care provider which countries are considered high risk. ? Your parents were born in a high-risk country, and you have not been immunized against hepatitis B (hepatitis B vaccine). ? You have HIV or AIDS. ? You use needles to inject street drugs. ? You live with someone who has hepatitis B. ? You have had sex with someone who has hepatitis B. ? You get hemodialysis treatment. ? You take certain medicines for conditions, including cancer, organ transplantation, and autoimmune conditions.  Hepatitis C  Blood testing is recommended for: ? Everyone born from [birth year] through 1963. ? Anyone with known risk factors for hepatitis C.  Sexually transmitted infections (STIs)  You should be screened for sexually transmitted infections (STIs) including gonorrhea and chlamydia if: ? You are sexually active and are younger than 81 years of age. ? You  are older than 81 years of age and your health care provider tells you that you are at risk for this type of infection. ? Your sexual activity has changed since you were last screened and you are at an increased risk for chlamydia or gonorrhea. Ask your health care provider if you are at risk.  If you do not have HIV, but are at risk, it may be recommended that you take a prescription medicine daily to prevent HIV infection. This is called pre-exposure prophylaxis (PrEP). You are considered at risk if: ? You are sexually active and do not regularly use condoms or know the HIV status of your partner(s). ? You take drugs by injection. ? You are sexually active with a partner who has HIV.  Talk with your health care provider about whether you are at high risk of being infected with HIV. If you choose to begin PrEP, you should first be tested for HIV. You should then be tested every 3 months for as long as you are taking PrEP. Pregnancy  If you are premenopausal and you may become pregnant, ask your health care provider about preconception counseling.  If you may become pregnant, take 400 to 800 micrograms (mcg) of folic acid every day.  If you want to prevent pregnancy, talk to your health care provider about birth control (contraception). Osteoporosis and menopause  Osteoporosis is a disease in which the bones lose minerals and strength with aging. This can result in serious bone fractures. Your risk for osteoporosis can be identified using a bone density scan.  If you are 42 years of age or older, or if you are at risk for osteoporosis and fractures, ask your health care provider if you should be screened.  Ask your health care provider whether you should take a calcium or vitamin D supplement to lower your risk for osteoporosis.  Menopause may have  certain physical symptoms and risks.  Hormone replacement therapy may reduce some of these symptoms and risks. Talk to your health care  provider about whether hormone replacement therapy is right for you. Follow these instructions at home:  Schedule regular health, dental, and eye exams.  Stay current with your immunizations.  Do not use any tobacco products including cigarettes, chewing tobacco, or electronic cigarettes.  If you are pregnant, do not drink alcohol.  If you are breastfeeding, limit how much and how often you drink alcohol.  Limit alcohol intake to no more than 1 drink per day for nonpregnant women. One drink equals 12 ounces of beer, 5 ounces of wine, or 1 ounces of hard liquor.  Do not use street drugs.  Do not share needles.  Ask your health care provider for help if you need support or information about quitting drugs.  Tell your health care provider if you often feel depressed.  Tell your health care provider if you have ever been abused or do not feel safe at home. This information is not intended to replace advice given to you by your health care provider. Make sure you discuss any questions you have with your health care provider. Document Released: 11/13/2010 Document Revised: 10/06/2015 Document Reviewed: 02/01/2015 Elsevier Interactive Patient Education  2018 [place] in the Home Falls can cause injuries and can affect people from all age groups. There are many simple things that you can do to make your home safe and to help prevent falls. What can I do on the outside of my home?  Regularly repair the edges of walkways and driveways and fix any cracks.  Remove high doorway thresholds.  Trim any shrubbery on the main path into your home.  Use bright outdoor lighting.  Clear walkways of debris and clutter, including tools and rocks.  Regularly check that handrails are securely fastened and in good repair. Both sides of any steps should have handrails.  Install guardrails along the edges of any raised decks or porches.  Have leaves, snow, and ice cleared  regularly.  Use sand or salt on walkways during winter months.  In the garage, clean up any spills right away, including grease or oil spills. What can I do in the bathroom?  Use night lights.  Install grab bars by the toilet and in the tub and shower. Do not use towel bars as grab bars.  Use non-skid mats or decals on the floor of the tub or shower.  If you need to sit down while you are in the shower, use a plastic, non-slip stool.  Keep the floor dry. Immediately clean up any water that spills on the floor.  Remove soap buildup in the tub or shower on a regular basis.  Attach bath mats securely with double-sided non-slip rug tape.  Remove throw rugs and other tripping hazards from the floor. What can I do in the bedroom?  Use night lights.  Make sure that a bedside light is easy to reach.  Do not use oversized bedding that drapes onto the floor.  Have a firm chair that has side arms to use for getting dressed.  Remove throw rugs and other tripping hazards from the floor. What can I do in the kitchen?  Clean up any spills right away.  Avoid walking on wet floors.  Place frequently used items in easy-to-reach places.  If you need to reach for something above you, use a sturdy step stool that has  a grab bar.  Keep electrical cables out of the way.  Do not use floor polish or wax that makes floors slippery. If you have to use wax, make sure that it is non-skid floor wax.  Remove throw rugs and other tripping hazards from the floor. What can I do in the stairways?  Do not leave any items on the stairs.  Make sure that there are handrails on both sides of the stairs. Fix handrails that are broken or loose. Make sure that handrails are as long as the stairways.  Check any carpeting to make sure that it is firmly attached to the stairs. Fix any carpet that is loose or worn.  Avoid having throw rugs at the top or bottom of stairways, or secure the rugs with carpet  tape to prevent them from moving.  Make sure that you have a light switch at the top of the stairs and the bottom of the stairs. If you do not have them, have them installed. What are some other fall prevention tips?  Wear closed-toe shoes that fit well and support your feet. Wear shoes that have rubber soles or low heels.  When you use a stepladder, make sure that it is completely opened and that the sides are firmly locked. Have someone hold the ladder while you are using it. Do not climb a closed stepladder.  Add color or contrast paint or tape to grab bars and handrails in your home. Place contrasting color strips on the first and last steps.  Use mobility aids as needed, such as canes, walkers, scooters, and crutches.  Turn on lights if it is dark. Replace any light bulbs that burn out.  Set up furniture so that there are clear paths. Keep the furniture in the same spot.  Fix any uneven floor surfaces.  Choose a carpet design that does not hide the edge of steps of a stairway.  Be aware of any and all pets.  Review your medicines with your healthcare provider. Some medicines can cause dizziness or changes in blood pressure, which increase your risk of falling. Talk with your health care provider about other ways that you can decrease your risk of falls. This may include working with a physical therapist or trainer to improve your strength, balance, and endurance. This information is not intended to replace advice given to you by your health care provider. Make sure you discuss any questions you have with your health care provider. Document Released: 04/20/2002 Document Revised: 09/27/2015 Document Reviewed: 06/04/2014 Elsevier Interactive Patient Education  2017 [employer].

## 2017-03-15 NOTE — Telephone Encounter (Signed)
Returned call to [facility] nurse left detailed message on confidential VM that we have made several attempts to contact pt/daughter.

## 2017-03-18 ENCOUNTER — Encounter: Payer: Self-pay | Admitting: *Deleted

## 2017-03-18 LAB — TB SKIN TEST
INDURATION: 0 mm
TB Skin Test: NEGATIVE

## 2017-03-19 ENCOUNTER — Encounter (HOSPITAL_COMMUNITY): Payer: Self-pay

## 2017-03-19 ENCOUNTER — Observation Stay (HOSPITAL_COMMUNITY)
Admission: EM | Admit: 2017-03-19 | Discharge: 2017-03-20 | Disposition: A | Discharge status: Home / Self Care | Payer: Medicare Other | Attending: Internal Medicine | Admitting: Internal Medicine

## 2017-03-19 ENCOUNTER — Other Ambulatory Visit: Payer: Self-pay

## 2017-03-19 ENCOUNTER — Emergency Department (HOSPITAL_COMMUNITY): Payer: Medicare Other

## 2017-03-19 DIAGNOSIS — I700 Atherosclerosis of aorta: Secondary | ICD-10-CM | POA: Diagnosis not present

## 2017-03-19 DIAGNOSIS — M6281 Muscle weakness (generalized): Secondary | ICD-10-CM | POA: Diagnosis not present

## 2017-03-19 DIAGNOSIS — S2220XA Unspecified fracture of sternum, initial encounter for closed fracture: Secondary | ICD-10-CM | POA: Diagnosis not present

## 2017-03-19 DIAGNOSIS — Z79890 Hormone replacement therapy (postmenopausal): Secondary | ICD-10-CM | POA: Insufficient documentation

## 2017-03-19 DIAGNOSIS — Z79899 Other long term (current) drug therapy: Secondary | ICD-10-CM | POA: Diagnosis not present

## 2017-03-19 DIAGNOSIS — Z90710 Acquired absence of both cervix and uterus: Secondary | ICD-10-CM | POA: Diagnosis not present

## 2017-03-19 DIAGNOSIS — Z9049 Acquired absence of other specified parts of digestive tract: Secondary | ICD-10-CM | POA: Diagnosis not present

## 2017-03-19 DIAGNOSIS — Z888 Allergy status to other drugs, medicaments and biological substances status: Secondary | ICD-10-CM | POA: Insufficient documentation

## 2017-03-19 DIAGNOSIS — Z881 Allergy status to other antibiotic agents status: Secondary | ICD-10-CM | POA: Insufficient documentation

## 2017-03-19 DIAGNOSIS — Z8349 Family history of other endocrine, nutritional and metabolic diseases: Secondary | ICD-10-CM | POA: Insufficient documentation

## 2017-03-19 DIAGNOSIS — Z66 Do not resuscitate: Secondary | ICD-10-CM | POA: Diagnosis not present

## 2017-03-19 DIAGNOSIS — W19XXXA Unspecified fall, initial encounter: Secondary | ICD-10-CM | POA: Insufficient documentation

## 2017-03-19 DIAGNOSIS — R269 Unspecified abnormalities of gait and mobility: Secondary | ICD-10-CM | POA: Diagnosis not present

## 2017-03-19 DIAGNOSIS — Z882 Allergy status to sulfonamides status: Secondary | ICD-10-CM | POA: Diagnosis not present

## 2017-03-19 DIAGNOSIS — R0902 Hypoxemia: Secondary | ICD-10-CM

## 2017-03-19 DIAGNOSIS — Z8673 Personal history of transient ischemic attack (TIA), and cerebral infarction without residual deficits: Secondary | ICD-10-CM | POA: Diagnosis not present

## 2017-03-19 DIAGNOSIS — I10 Essential (primary) hypertension: Secondary | ICD-10-CM

## 2017-03-19 DIAGNOSIS — Z9889 Other specified postprocedural states: Secondary | ICD-10-CM | POA: Insufficient documentation

## 2017-03-19 DIAGNOSIS — I509 Heart failure, unspecified: Secondary | ICD-10-CM

## 2017-03-19 DIAGNOSIS — R1084 Generalized abdominal pain: Secondary | ICD-10-CM

## 2017-03-19 DIAGNOSIS — Z8261 Family history of arthritis: Secondary | ICD-10-CM | POA: Insufficient documentation

## 2017-03-19 DIAGNOSIS — I340 Nonrheumatic mitral (valve) insufficiency: Secondary | ICD-10-CM | POA: Insufficient documentation

## 2017-03-19 DIAGNOSIS — Z8542 Personal history of malignant neoplasm of other parts of uterus: Secondary | ICD-10-CM | POA: Diagnosis not present

## 2017-03-19 DIAGNOSIS — R0689 Other abnormalities of breathing: Secondary | ICD-10-CM | POA: Diagnosis not present

## 2017-03-19 DIAGNOSIS — I110 Hypertensive heart disease with heart failure: Secondary | ICD-10-CM | POA: Diagnosis not present

## 2017-03-19 DIAGNOSIS — Z880 Allergy status to penicillin: Secondary | ICD-10-CM | POA: Diagnosis not present

## 2017-03-19 DIAGNOSIS — E039 Hypothyroidism, unspecified: Secondary | ICD-10-CM

## 2017-03-19 DIAGNOSIS — Z8249 Family history of ischemic heart disease and other diseases of the circulatory system: Secondary | ICD-10-CM | POA: Insufficient documentation

## 2017-03-19 DIAGNOSIS — J9601 Acute respiratory failure with hypoxia: Secondary | ICD-10-CM | POA: Diagnosis not present

## 2017-03-19 DIAGNOSIS — Z8379 Family history of other diseases of the digestive system: Secondary | ICD-10-CM | POA: Insufficient documentation

## 2017-03-19 DIAGNOSIS — M4854XA Collapsed vertebra, not elsewhere classified, thoracic region, initial encounter for fracture: Secondary | ICD-10-CM | POA: Insufficient documentation

## 2017-03-19 DIAGNOSIS — Z833 Family history of diabetes mellitus: Secondary | ICD-10-CM | POA: Insufficient documentation

## 2017-03-19 DIAGNOSIS — I5033 Acute on chronic diastolic (congestive) heart failure: Secondary | ICD-10-CM | POA: Diagnosis not present

## 2017-03-19 DIAGNOSIS — R0602 Shortness of breath: Secondary | ICD-10-CM | POA: Diagnosis not present

## 2017-03-19 DIAGNOSIS — R069 Unspecified abnormalities of breathing: Secondary | ICD-10-CM | POA: Diagnosis not present

## 2017-03-19 DIAGNOSIS — R2681 Unsteadiness on feet: Secondary | ICD-10-CM | POA: Insufficient documentation

## 2017-03-19 DIAGNOSIS — Z85038 Personal history of other malignant neoplasm of large intestine: Secondary | ICD-10-CM | POA: Diagnosis not present

## 2017-03-19 LAB — I-STAT TROPONIN, ED: TROPONIN I, POC: 0.06 ng/mL (ref 0.00–0.08)

## 2017-03-19 LAB — COMPREHENSIVE METABOLIC PANEL
ALK PHOS: 114 U/L (ref 38–126)
ALT: 28 U/L (ref 14–54)
AST: 44 U/L — ABNORMAL HIGH (ref 15–41)
Albumin: 3 g/dL — ABNORMAL LOW (ref 3.5–5.0)
Anion gap: 11 (ref 5–15)
BILIRUBIN TOTAL: 0.5 mg/dL (ref 0.3–1.2)
BUN: 14 mg/dL (ref 6–20)
CALCIUM: 8.7 mg/dL — AB (ref 8.9–10.3)
CO2: 24 mmol/L (ref 22–32)
CREATININE: 0.63 mg/dL (ref 0.44–1.00)
Chloride: 102 mmol/L (ref 101–111)
Glucose, Bld: 113 mg/dL — ABNORMAL HIGH (ref 65–99)
Potassium: 3.7 mmol/L (ref 3.5–5.1)
Sodium: 137 mmol/L (ref 135–145)
TOTAL PROTEIN: 6.8 g/dL (ref 6.5–8.1)

## 2017-03-19 LAB — CBC
HCT: 38.3 % (ref 36.0–46.0)
Hemoglobin: 12.3 g/dL (ref 12.0–15.0)
MCH: 31.3 pg (ref 26.0–34.0)
MCHC: 32.1 g/dL (ref 30.0–36.0)
MCV: 97.5 fL (ref 78.0–100.0)
Platelets: 398 10*3/uL (ref 150–400)
RBC: 3.93 MIL/uL (ref 3.87–5.11)
RDW: 18.1 % — ABNORMAL HIGH (ref 11.5–15.5)
WBC: 6.2 10*3/uL (ref 4.0–10.5)

## 2017-03-19 LAB — C-REACTIVE PROTEIN: CRP: 0.9 mg/dL (ref <1.0)

## 2017-03-19 LAB — SEDIMENTATION RATE: SED RATE: 38 mm/h — AB (ref 0–22)

## 2017-03-19 LAB — LACTIC ACID, PLASMA: Lactic Acid, Venous: 1.1 mmol/L (ref 0.5–1.9)

## 2017-03-19 LAB — T4, FREE: Free T4: 1.04 ng/dL (ref 0.61–1.12)

## 2017-03-19 LAB — BRAIN NATRIURETIC PEPTIDE: B NATRIURETIC PEPTIDE 5: 635.6 pg/mL — AB (ref 0.0–100.0)

## 2017-03-19 MED ORDER — LEVOTHYROXINE SODIUM 50 MCG PO TABS
50.0000 ug | ORAL_TABLET | Freq: Every day | ORAL | Status: DC
  Administered 2017-03-20: 50 ug via ORAL
  Filled 2017-03-19: qty 1

## 2017-03-19 MED ORDER — LISINOPRIL 20 MG PO TABS
20.0000 mg | ORAL_TABLET | Freq: Every day | ORAL | Status: DC
  Administered 2017-03-19: 20 mg via ORAL
  Filled 2017-03-19: qty 1

## 2017-03-19 MED ORDER — ALBUTEROL SULFATE (2.5 MG/3ML) 0.083% IN NEBU
5.0000 mg | INHALATION_SOLUTION | Freq: Once | RESPIRATORY_TRACT | Status: DC
  Filled 2017-03-19: qty 6

## 2017-03-19 MED ORDER — POTASSIUM CHLORIDE CRYS ER 20 MEQ PO TBCR
20.0000 meq | EXTENDED_RELEASE_TABLET | Freq: Two times a day (BID) | ORAL | Status: DC
  Administered 2017-03-19 – 2017-03-20 (×3): 20 meq via ORAL
  Filled 2017-03-19 (×3): qty 1

## 2017-03-19 MED ORDER — ONDANSETRON HCL 4 MG/2ML IJ SOLN: 4.0000 mg | Freq: Four times a day (QID) | INTRAMUSCULAR | Status: DC | PRN

## 2017-03-19 MED ORDER — SODIUM CHLORIDE 0.9% FLUSH: 3.0000 mL | INTRAVENOUS | Status: DC | PRN

## 2017-03-19 MED ORDER — FUROSEMIDE 10 MG/ML IJ SOLN
40.0000 mg | Freq: Two times a day (BID) | INTRAMUSCULAR | Status: DC
  Administered 2017-03-19 – 2017-03-20 (×2): 40 mg via INTRAVENOUS
  Filled 2017-03-19 (×2): qty 4

## 2017-03-19 MED ORDER — FUROSEMIDE 10 MG/ML IJ SOLN
20.0000 mg | INTRAMUSCULAR | Status: AC
  Administered 2017-03-19: 20 mg via INTRAVENOUS
  Filled 2017-03-19: qty 2

## 2017-03-19 MED ORDER — ENOXAPARIN SODIUM 30 MG/0.3ML ~~LOC~~ SOLN
30.0000 mg | SUBCUTANEOUS | Status: DC
  Administered 2017-03-19: 30 mg via SUBCUTANEOUS
  Filled 2017-03-19 (×2): qty 0.3

## 2017-03-19 MED ORDER — ACETAMINOPHEN 325 MG PO TABS
650.0000 mg | ORAL_TABLET | ORAL | Status: DC | PRN
  Administered 2017-03-19: 650 mg via ORAL
  Filled 2017-03-19: qty 2

## 2017-03-19 MED ORDER — SODIUM CHLORIDE 0.9% FLUSH
3.0000 mL | Freq: Two times a day (BID) | INTRAVENOUS | Status: DC
  Administered 2017-03-19 (×2): 3 mL via INTRAVENOUS

## 2017-03-19 MED ORDER — SODIUM CHLORIDE 0.9 % IV SOLN: 250.0000 mL | INTRAVENOUS | Status: DC | PRN

## 2017-03-19 NOTE — ED Notes (Signed)
Placed PureWick per Jarrett Soho (RN)

## 2017-03-19 NOTE — Progress Notes (Signed)
Pt admitted with daughter at bedside no distress. BSC with walking

## 2017-03-19 NOTE — ED Triage Notes (Signed)
Pt coming from home BIB [facility] called out by daughter. Daughter stating she was very SOB when she had woken the pt up. Pt has also lost a lot of weight over the last 6 months. Ems stated pt was 92% spo2 place a pt on 6 L of oxygen pt was 98 spo2 after that. Pt states she can breathe now pt was place on 2L and is still at 98%. Pt is axox3

## 2017-03-19 NOTE — H&P (Signed)
History and Physical    Lori [name] KMM:[id number] DOB: 12-30-1918 DOA: 03/19/2017   PCP: Lucretia Kern, DO   Attending physician: Marily Memos  Patient coming from/Resides with: Private residence/with daughter  Chief Complaint: Shortness of breath  HPI: Lori [name] is a 83 y.o. female with medical history significant for long-standing hypertension, hypothyroidism, and new diagnosis of diastolic heart failure diagnosed in September 2018.  Patient was in her usual state of health when she awakened around 6 AM with significant shortness of breath and was unable to speak due to her dyspnea.  EMS was called to the home.  Patient was placed on 6 L oxygen with O2 sats of 92%.  Upon arrival to the ER patient was hypertensive with a BP of 147/103, tachycardic and tachypneic but she was afebrile.  O2 saturations had improved to 98% on 2 L oxygen.  Chest x-ray revealed congestive heart failure.  BNP was elevated at 636, point-of-care troponin was normal.  Renal function normal.  No leukocytosis.  Patient has been given a dose of Lasix 20 mg IV x1 in the ER.  At home she typically takes Lasix 20 mg p.o. daily as needed for symptoms or weight gain.  ED Course:  Vital Signs: BP (!) 136/93   Pulse 74   Temp 97.6 F (36.4 C)   Resp (!) 25   Ht 5' (1.524 m)   Wt 42.2 kg (93 lb)   SpO2 94%   BMI 18.16 kg/m  Chest x-ray: As above Lab data: Sodium 137, potassium 3.7, chloride 102, CO2 24, glucose 113, BUN 14, creatinine 0.63, AST 44, BNP 636, poc troponin 0 0.06, white count 6200 differential not obtained, hemoglobin 12.3, platelets 398,000 Medications and treatments: Lasix 20 mg IV x1   Review of Systems:  In addition to the HPI above,  No Fever-chills, myalgias or other constitutional symptoms No Headache, changes with Vision or hearing, new weakness, tingling, numbness in any extremity, dizziness, dysarthria or word finding difficulty, gait disturbance or imbalance, tremors or seizure  activity No problems swallowing food or Liquids, indigestion/reflux, choking or coughing while eating, abdominal pain with or after eating No Chest pain, Cough or Shortness of Breath, palpitations, orthopnea or DOE No Abdominal pain, N/V, melena,hematochezia, dark tarry stools, constipation No dysuria, malodorous urine, hematuria or flank pain No new skin rashes, lesions, masses or bruises, No new joint pains, aches, swelling or redness No recent unintentional weight gain or loss No polyuria, polydypsia or polyphagia   Past Medical History:  Diagnosis Date  . Colon cancer ([place]) 2009   s/p colon resection  . Endometrial cancer ([place]) 1989   endometrial s/p hysterectomy, chemo and radiation  . Hearing loss   . History of blood transfusion   . Hypertension   . Macular degeneration   . Migraines   . Pneumonia   . Positive TB test   . Stroke [facility] 2004   ? TIA, brief episode of speech issues - evaluate OH    Past Surgical History:  Procedure Laterality Date  . ABDOMINAL HYSTERECTOMY  1989  . APPENDECTOMY    . COLON RESECTION  08/27/2007  . GALLBLADDER SURGERY  1970  . SUBDURAL HEMATOMA EVACUATION VIA CRANIOTOMY  2002  . TONSILLECTOMY AND ADENOIDECTOMY  1943    Social History   Socioeconomic History  . Marital status: Widowed    Spouse name: Not on file  . Number of children: 2  . Years of education: Not on file  . Highest  education level: Not on file  Social Needs  . Financial resource strain: Not on file  . Food insecurity - worry: Not on file  . Food insecurity - inability: Not on file  . Transportation needs - medical: Not on file  . Transportation needs - non-medical: Not on file  Occupational History  . Occupation: retired  Tobacco Use  . Smoking status: Never Smoker  . Smokeless tobacco: Never Used  Substance and Sexual Activity  . Alcohol use: Yes    Comment: occ glass of wine- one every 2 months   . Drug use: No  . Sexual activity: Not on file  Other  Topics Concern  . 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 Bactrim [Sulfamethoxazole-Trimethoprim] Itching    Blisters   . Clindamycin/Lincomycin Other (See Comments)    GI upset  . Macrobid [Nitrofurantoin Macrocrystal] Itching  . Penicillins Itching    Has patient had a PCN reaction causing immediate rash, facial/tongue/throat swelling, SOB or lightheadedness with hypotension Unknown Has patient had a PCN reaction causing severe rash involving mucus membranes or skin necrosis: Unknown Has patient had a PCN reaction that required hospitalization: Unknown Has patient had a PCN reaction occurring within the last 10 years:Unknown If all of the above answers are "NO", then may proceed with Cephalosporin use.   . Sulfa Antibiotics Itching  . Zofran [Ondansetron Hcl] Hives    Family History  Problem Relation Age of Onset  . Arthritis Mother   . Heart disease Father   . Hyperlipidemia Father   . Hypertension Father   . Diabetes Sister   . Crohn's disease Daughter    Family history reviewed and not pertinent   Prior to Admission medications   Medication Sig Start Date End Date Taking? Authorizing Provider  acetaminophen (TYLENOL) 500 MG tablet Take 500 mg by mouth as needed.    [provider]  Cholecalciferol (VITAMIN D PO) Take 1 capsule by mouth daily.     [provider]  furosemide (LASIX) 20 MG tablet Take 1 tablet (20 mg total) by mouth daily as needed. For swelling or shortness of breath 02/05/17   Elodia Florence., MD  levothyroxine (SYNTHROID, LEVOTHROID) 50 MCG tablet TAKE 2 TABLETS ON MONDAY AND FRIDAY. TAKE 1 TABLET ALL OTHER DAYS 11/26/16   Lucretia Kern, DO  lisinopril (PRINIVIL,ZESTRIL)  20 MG tablet TAKE 1 TABLET (20 MG TOTAL) BY MOUTH DAILY. 12/13/16   Lucretia Kern, DO  Multiple Vitamins-Minerals (EYE VITAMINS PO) Take 1 tablet by mouth 2 (two) times daily.     [provider]  Riboflavin (VITAMIN B-2 PO) Take 1 tablet by mouth daily.     [provider]  triamcinolone cream (KENALOG) 0.1 % Apply 1 application topically 2 (two) times daily. 11/08/16   Lucretia Kern, DO    Physical Exam: Vitals:   03/19/17 0900 03/19/17 0915 03/19/17 0930 03/19/17 1000  BP: 122/78 (!) 136/94 (!) 154/89 (!) 136/93  Pulse: 99  (!) 105 74  Resp: 19 (!) 28 (!) 22 (!) 25  Temp:      SpO2: 98% 95% 97% 94%  Weight:      Height:          Constitutional: NAD, anxious, comfortable, very frail appearing elderly female patient Eyes: PERRL, lids and conjunctivae normal ENMT: Mucous membranes are moist. Posterior pharynx clear of any exudate or lesions.age-appropriate dentition.  Neck: normal, supple, no masses, no thyromegaly Respiratory: Diffuse bilateral crackles on posterior auscultation.  Tachypnea at rest as well as increased work of breathing at rest has resolved but with attempts to reposition in the bed patient develops increased work of breathing.  2 L oxygen with O2 sats 96-98% Cardiovascular: Regular rate and rhythm, no murmurs / rubs / gallops. No extremity edema. 2+ pedal pulses. No carotid bruits.  Abdomen: no tenderness, no masses palpated. No hepatosplenomegaly. Bowel sounds positive.  Genitourinary: PureWick in place with urine noted in canister Musculoskeletal: no clubbing / cyanosis. No joint deformity upper and lower extremities. Good ROM, no contractures. Normal muscle tone.  Skin: no rashes, lesions, ulcers. No induration Neurologic: CN 2-12 grossly intact. Sensation intact, DTR normal. Strength 5/5 x all 4 extremities.  Psychiatric: Alert and oriented x 3 seems to have some short-term memory deficits.  Somewhat focused on increased urinary output despite both  my and her daughter's explanation that this is expected given the medication she is receiving to assist with her shortness of breath and heart failure symptoms.    Labs on Admission: I have personally reviewed following labs and imaging studies  CBC: Recent Labs  Lab 03/19/17 0802  WBC 6.2  HGB 12.3  HCT 38.3  MCV 97.5  PLT 741   Basic Metabolic Panel: Recent Labs  Lab 03/19/17 0802  NA 137  K 3.7  CL 102  CO2 24  GLUCOSE 113*  BUN 14  CREATININE 0.63  CALCIUM 8.7*   GFR: Estimated Creatinine Clearance: 27.4 mL/min (by C-G formula based on SCr of 0.63 mg/dL). Liver Function Tests: Recent Labs  Lab 03/19/17 0802  AST 44*  ALT 28  ALKPHOS 114  BILITOT 0.5  PROT 6.8  ALBUMIN 3.0*   No results for input(s): LIPASE, AMYLASE in the last 168 hours. No results for input(s): AMMONIA in the last 168 hours. Coagulation Profile: No results for input(s): INR, PROTIME in the last 168 hours. Cardiac Enzymes: No results for input(s): CKTOTAL, CKMB, CKMBINDEX, TROPONINI in the last 168 hours. BNP (last 3 results) No results for input(s): PROBNP in the last 8760 hours. HbA1C: No results for input(s): HGBA1C in the last 72 hours. CBG: No results for input(s): GLUCAP in the last 168 hours. Lipid Profile: No results for input(s): CHOL, HDL, LDLCALC, TRIG, CHOLHDL, LDLDIRECT in the last 72 hours. Thyroid Function Tests: No results for input(s): TSH, T4TOTAL, FREET4, T3FREE, THYROIDAB in the last 72 hours. Anemia Panel: No results for input(s): VITAMINB12, FOLATE, FERRITIN, TIBC, IRON, RETICCTPCT in the last 72 hours. Urine analysis:    Component Value Date/Time   BILIRUBINUR n 03/16/2013 1504   PROTEINUR n 03/16/2013 1504   UROBILINOGEN 0.2 03/16/2013 1504   NITRITE n 03/16/2013 1504   LEUKOCYTESUR large (3+) 03/16/2013 1504   Sepsis Labs: _0 (procalcitonin:4,lacticidven:4) )No results found for this or any previous visit (from the past 240 hour(s)).    Radiological Exams on Admission: Dg Chest 2 View  Result Date: 03/19/2017 CLINICAL DATA:  Shortness of Breath EXAM: CHEST  2 VIEW COMPARISON:  02/27/2017 FINDINGS: Cardiac shadow remains enlarged. Vascular congestion is noted with significant interstitial edema increased from the prior exam consistent with worsening CHF. Small effusions are noted  bilaterally. Some left basilar atelectasis is seen increased from the prior exam. No bony abnormality is noted. IMPRESSION: Worsening CHF Electronically Signed   By: Inez Catalina M.D.   On: 03/19/2017 07:56    EKG: (Independently reviewed) sinus tachycardia with a ventricular rate of 114 bpm, QTC 471 ms, voltage criteria met for LVH, chronically elevated T waves in V3 and V4 likely secondary to early repolarization  Assessment/Plan Principal Problem:   Acute respiratory insufficiency 2/2 Acute on chronic diastolic heart failure with associated moderate to severe mitral regurgitation -Patient presents with acute onset of dyspnea with chest x-ray consistent with CHF and elevated BNP -Continue supportive care with oxygen -Lasix 40 mg IV every 12 hours with supplemental potassium -Daily weights, strict I/O -Continue lisinopril -No indication for beta-blocker -Echocardiogram completed last month: EF 50% with focal basal hypertrophy of the septum and diffuse hypokinesis, grade 2 diastolic dysfunction, left atrial dilatation, moderate to severe mitral regurgitation  Active Problems:   Hypertension -Current blood pressure not well controlled -Continue ACE inhibitor    Hypothyroidism, adult -Outpatient TSH 11.51 -Check free T4 and T3 -Daughter admits that since patient has returned from recent hospital admission there has been inconsistent dosing with her Synthroid -Continue current Synthroid 50 mcg daily      DVT prophylaxis: Lovenox Code Status: DNR Family Communication: Daughter Disposition Plan: Home Consults called:  None    Ariellah Faust L. ANP-BC [facility] Pager [phone]   If 7PM-7AM, please contact night-coverage [email or web address] Password [email or web address]  03/19/2017, 10:30 AM

## 2017-03-19 NOTE — ED Provider Notes (Signed)
[facility] EMERGENCY DEPARTMENT Provider Note   CSN: [id number] Arrival date & time: 03/19/17  9798     History   Chief Complaint Chief Complaint  Patient presents with  . Shortness of Breath    HPI Lori [name] is a 83 y.o. female.  83yo F w/ PMH including CVA, colon CA, CHF, HTN who p/w shortness of breath.  Daughter states that for the past few days the patient has seemed a Denny Lave more short of breath and this morning woke up with severe shortness of breath.  She had an admission to the hospital a few months ago for CHF exacerbation.  She has Lasix written for as needed, daughter has given it to her 3 times this week.  She has had a weight loss over the past 6 months.  No cough/cold symptoms, vomiting, diarrhea, fevers, or chest pain.   The history is provided by the patient and a relative.  Shortness of Breath     Past Medical History:  Diagnosis Date  . Colon cancer [place]) 2009   s/p colon resection  . Endometrial cancer ([place]) 1989   endometrial s/p hysterectomy, chemo and radiation  . Hearing loss   . History of blood transfusion   . Hypertension   . Macular degeneration   . Migraines   . Pneumonia   . Positive TB test   . Stroke [facility]) 2004   ? TIA, brief episode of speech issues - evaluate OH    Patient Active Problem List   Diagnosis Date Noted  . Moderate to severe mitral regurgitation 02/19/2017  . Community acquired pneumonia/Rt LL 02/03/2017  . CHF (congestive heart failure)/Unspecified 02/03/2017  . Vertebral fracture, osteoporotic/T5 02/03/2017  . Sternal fracture 02/03/2017  . Hyperglycemia 10/10/2016  . Back pain 10/09/2016  . Change in bowel habits 07/24/2013  . History of colon cancer 07/24/2013  . History of endometrial cancer 07/24/2013  . Loss of weight 07/24/2013  . Cystitis 03/16/2013  . Essential hypertension, benign 11/24/2012  . Hx of TIA (transient ischemic attack)  11/24/2012  . Hypothyroidism 11/24/2012     Past Surgical History:  Procedure Laterality Date  . ABDOMINAL HYSTERECTOMY  1989  . APPENDECTOMY    . COLON RESECTION  08/27/2007  . GALLBLADDER SURGERY  1970  . SUBDURAL HEMATOMA EVACUATION VIA CRANIOTOMY  2002  . TONSILLECTOMY AND ADENOIDECTOMY  1943    OB History    No data available       Home Medications    Prior to Admission medications   Medication Sig Start Date End Date Taking? Authorizing Provider  acetaminophen (TYLENOL) 500 MG tablet Take 500 mg by mouth as needed.    [provider]  Cholecalciferol (VITAMIN D PO) Take 1 capsule by mouth daily.     [provider]  furosemide (LASIX) 20 MG tablet Take 1 tablet (20 mg total) by mouth daily as needed. For swelling or shortness of breath 02/05/17   Elodia Florence., MD  levothyroxine (SYNTHROID, LEVOTHROID) 50 MCG tablet TAKE 2 TABLETS ON MONDAY AND FRIDAY. TAKE 1 TABLET ALL OTHER DAYS 11/26/16   Lucretia Kern, DO  lisinopril (PRINIVIL,ZESTRIL) 20 MG tablet TAKE 1 TABLET (20 MG TOTAL) BY MOUTH DAILY. 12/13/16   Lucretia Kern, DO  Multiple Vitamins-Minerals (EYE VITAMINS PO) Take 1 tablet by mouth 2 (two) times daily.     [provider]  Riboflavin (VITAMIN B-2 PO) Take 1 tablet by mouth daily.  [provider]  triamcinolone cream (KENALOG) 0.1 % Apply 1 application topically 2 (two) times daily. 11/08/16   Lucretia Kern, DO    Family History Family History  Problem Relation Age of Onset  . Arthritis Mother   . Heart disease Father   . Hyperlipidemia Father   . Hypertension Father   . Diabetes Sister   . Crohn's disease Daughter     Social History Social History   Tobacco Use  . Smoking status: Never Smoker  . Smokeless tobacco: Never Used  Substance Use Topics  . Alcohol use: Yes    Comment: occ glass of wine- one every 2 months   . Drug use: No     Allergies   Bactrim [sulfamethoxazole-trimethoprim]; Clindamycin/lincomycin; Macrobid [nitrofurantoin  macrocrystal]; Penicillins; Sulfa antibiotics; and Zofran [ondansetron hcl]   Review of Systems Review of Systems  Respiratory: Positive for shortness of breath.    All other systems reviewed and are negative except that which was mentioned in HPI   Physical Exam Updated Vital Signs BP (!) 152/106   Pulse (!) 53   Temp 97.6 F (36.4 C)   Resp (!) 28   Ht 5' (1.524 m)   Wt 42.2 kg (93 lb)   SpO2 94%   BMI 18.16 kg/m   Physical Exam  Constitutional: She appears well-developed. No distress.  Thin, cachectic, elderly woman anxious  HENT:  Head: Normocephalic and atraumatic.  Moist mucous membranes  Eyes: Conjunctivae are normal. Pupils are equal, round, and reactive to light.  Neck: Neck supple.  Cardiovascular: Normal rate, regular rhythm and normal heart sounds.  No murmur heard. Pulmonary/Chest:  Mildly dyspneic without respiratory distress, diminished b/l with crackles in bases  Abdominal: Soft. Bowel sounds are normal. She exhibits no distension. There is no tenderness.  Musculoskeletal: She exhibits no edema.  Neurological: She is alert.  Fluent speech, mild disorientation but able to follow commands  Skin: Skin is warm and dry. There is pallor.  Psychiatric: Judgment normal. Her mood appears anxious.  Nursing note and vitals reviewed.    ED Treatments / Results  Labs (all labs ordered are listed, but only abnormal results are displayed) Labs Reviewed  COMPREHENSIVE METABOLIC PANEL - Abnormal; Notable for the following components:      Result Value   Glucose, Bld 113 (*)    Calcium 8.7 (*)    Albumin 3.0 (*)    AST 44 (*)    All other components within normal limits  BRAIN NATRIURETIC PEPTIDE - Abnormal; Notable for the following components:   B Natriuretic Peptide 635.6 (*)    All other components within normal limits  CBC - Abnormal; Notable for the following components:   RDW 18.1 (*)    All other components within normal limits  T4, FREE  I-STAT  TROPONIN, ED    EKG  EKG Interpretation  Date/Time:  Tuesday March 19 2017 07:19:57 EST Ventricular Rate:  114 PR Interval:    QRS Duration: 96 QT Interval:  342 QTC Calculation: 471 R Axis:   -40 Text Interpretation:  Sinus tachycardia Multiple premature complexes, vent & supraven LVH with secondary repolarization abnormality Inferior infarct, old Anterior infarct, acute (LAD) Baseline wander in lead(s) V6 tachycardia new from previous Confirmed by Theotis Burrow [phone]) on 03/19/2017 7:37:28 AM Also confirmed by Theotis Burrow [phone]), editor Laurena Spies [phone])  on 03/19/2017 9:53:46 AM       Radiology Dg Chest 2 View  Result Date: 03/19/2017 CLINICAL DATA:  Shortness of  Breath EXAM: CHEST  2 VIEW COMPARISON:  02/27/2017 FINDINGS: Cardiac shadow remains enlarged. Vascular congestion is noted with significant interstitial edema increased from the prior exam consistent with worsening CHF. Small effusions are noted bilaterally. Some left basilar atelectasis is seen increased from the prior exam. No bony abnormality is noted. IMPRESSION: Worsening CHF Electronically Signed   By: Inez Catalina M.D.   On: 03/19/2017 07:56    Procedures Procedures (including critical care time)  Medications Ordered in ED Medications  furosemide (LASIX) injection 40 mg (not administered)  levothyroxine (SYNTHROID, LEVOTHROID) tablet 50 mcg (not administered)  lisinopril (PRINIVIL,ZESTRIL) tablet 20 mg (not administered)  sodium chloride flush (NS) 0.9 % injection 3 mL (not administered)  sodium chloride flush (NS) 0.9 % injection 3 mL (not administered)  0.9 %  sodium chloride infusion (not administered)  acetaminophen (TYLENOL) tablet 650 mg (not administered)  enoxaparin (LOVENOX) injection 30 mg (not administered)  potassium chloride SA (K-DUR,KLOR-CON) CR tablet 20 mEq (not administered)  furosemide (LASIX) injection 20 mg (20 mg Intravenous Given 03/19/17 0909)     Initial Impression /  Assessment and Plan / ED Course  I have reviewed the triage vital signs and the nursing notes.  Pertinent labs & imaging results that were available during my care of the patient were reviewed by me and considered in my medical decision making (see chart for details).    PT w/ worsening SOB, h/o CHF exacerbation last month.  She was nontoxic on exam, requiring 2 L nasal cannula to maintain O2 saturations in the mid 90s.  Afebrile.  No respiratory distress.  She did have crackles on exam.  Chest x-ray shows worsening pulmonary edema BNP is 635.  Gave IV Lasix.  Contacted [facility] for admission for diuresis, APP Ebony Hail to complete admission. I appreciate her assistance with patient's care.  Final Clinical Impressions(s) / ED Diagnoses   Final diagnoses:  None    ED Discharge Orders    None       Caid Radin, Wenda Overland, MD 03/19/17 1040

## 2017-03-19 NOTE — ED Notes (Signed)
PT has DNR\ at bed side 

## 2017-03-19 NOTE — ED Notes (Signed)
Provider at the bedside.  

## 2017-03-19 NOTE — Telephone Encounter (Signed)
Patient is currently in the ER. 

## 2017-03-19 NOTE — ED Notes (Signed)
Attempted Report x1.   

## 2017-03-19 NOTE — ED Notes (Signed)
MD Little at the bedside

## 2017-03-20 ENCOUNTER — Other Ambulatory Visit: Payer: Self-pay

## 2017-03-20 DIAGNOSIS — I5033 Acute on chronic diastolic (congestive) heart failure: Secondary | ICD-10-CM

## 2017-03-20 DIAGNOSIS — J9601 Acute respiratory failure with hypoxia: Secondary | ICD-10-CM | POA: Diagnosis not present

## 2017-03-20 LAB — BASIC METABOLIC PANEL
Anion gap: 8 (ref 5–15)
BUN: 14 mg/dL (ref 6–20)
CHLORIDE: 101 mmol/L (ref 101–111)
CO2: 29 mmol/L (ref 22–32)
CREATININE: 0.75 mg/dL (ref 0.44–1.00)
Calcium: 8.4 mg/dL — ABNORMAL LOW (ref 8.9–10.3)
GFR calc Af Amer: >60 mL/min (ref >60)
GFR calc non Af Amer: >60 mL/min (ref >60)
GLUCOSE: 118 mg/dL — AB (ref 65–99)
POTASSIUM: 3.8 mmol/L (ref 3.5–5.1)
Sodium: 138 mmol/L (ref 135–145)

## 2017-03-20 MED ORDER — CARVEDILOL 3.125 MG PO TABS
3.1250 mg | ORAL_TABLET | Freq: Two times a day (BID) | ORAL | Status: DC
  Administered 2017-03-20: 3.125 mg via ORAL
  Filled 2017-03-20: qty 1

## 2017-03-20 MED ORDER — POTASSIUM CHLORIDE CRYS ER 20 MEQ PO TBCR: 20.0000 meq | EXTENDED_RELEASE_TABLET | Freq: Every day | ORAL | 0 refills | Status: DC

## 2017-03-20 MED ORDER — POTASSIUM CHLORIDE CRYS ER 20 MEQ PO TBCR: 20.0000 meq | EXTENDED_RELEASE_TABLET | Freq: Every day | ORAL | Status: DC

## 2017-03-20 MED ORDER — CARVEDILOL 3.125 MG PO TABS: 3.1250 mg | ORAL_TABLET | Freq: Two times a day (BID) | ORAL | 0 refills | Status: DC

## 2017-03-20 MED ORDER — FUROSEMIDE 10 MG/ML IJ SOLN: 20.0000 mg | Freq: Two times a day (BID) | INTRAMUSCULAR | Status: DC

## 2017-03-20 MED ORDER — FUROSEMIDE 20 MG PO TABS: 20.0000 mg | ORAL_TABLET | Freq: Every day | ORAL | Status: DC

## 2017-03-20 MED ORDER — CARVEDILOL 3.125 MG PO TABS: 3.1250 mg | ORAL_TABLET | Freq: Every day | ORAL | 0 refills | Status: DC

## 2017-03-20 MED ORDER — FUROSEMIDE 20 MG PO TABS: 20.0000 mg | ORAL_TABLET | Freq: Every day | ORAL | 1 refills | Status: DC

## 2017-03-20 NOTE — Care Management Note (Signed)
Case Management Note  Patient Details  Name: Lori [name] MRN: [id number] Date of Birth: 05/01/1919  Subjective/Objective:       CHF            Action/Plan: Patient lives at home with her daughter; has private insurance with Medicare / Airline [employer] with prescription drug coverage; is active with [facility] for [facility] services; Tim with [facility] called for resumption of services; DME - cane at home;  Expected Discharge Date:  03/20/17               Expected Discharge Plan:  [place]  In-House Referral:   [facility]  Discharge planning Services  CM Consult Choice offered to:  Patient, Adult Children  HH Arranged:  RN, PT, OT, Nurse's Aide [place] Agency:  [facility] (now [facility])  Status of Service:  In process, will continue to follow  Sherrilyn Rist [phone] 03/20/2017, 12:19 PM

## 2017-03-20 NOTE — Discharge Summary (Signed)
Physician Discharge Summary  [name] VHQ:[id number] DOB: 1918/10/01 DOA: 03/19/2017  PCP: Lucretia Kern, DO  Admit date: 03/19/2017 Discharge date: 03/20/2017  Admitted From: Home  Disposition: Home   Recommendations for Outpatient Follow-up:  1. Follow up with PCP in 1-2 weeks 2. Please obtain BMP/CBC in one week 3. Adjust lasix as needed. Monitor BP on low dose coreg.   Home Health: yes., resume  Discharge Condition: stable.  CODE STATUS: DNR Diet recommendation: Heart Healthy   Brief/Interim Summary: Lori [name] is a 83 y.o. female with medical history significant for long-standing hypertension, hypothyroidism, and new diagnosis of diastolic heart failure diagnosed in September 2018.  Patient was in her usual state of health when she awakened around 6 AM with significant shortness of breath and was unable to speak due to her dyspnea.  EMS was called to the home.  Patient was placed on 6 L oxygen with O2 sats of 92%.  Upon arrival to the ER patient was hypertensive with a BP of 147/103, tachycardic and tachypneic but she was afebrile.  O2 saturations had improved to 98% on 2 L oxygen.  Chest x-ray revealed congestive heart failure.  BNP was elevated at 636, point-of-care troponin was normal.  Renal function normal.  No leukocytosis.  Patient has been given a dose of Lasix 20 mg IV x1 in the ER.  At home she typically takes Lasix 20 mg p.o. daily as needed for symptoms or weight gain.  ED Course:  Vital Signs: BP (!) 136/93   Pulse 74   Temp 97.6 F (36.4 C)   Resp (!) 25   Ht 5' (1.524 m)   Wt 42.2 kg (93 lb)   SpO2 94%   BMI 18.16 kg/m  Chest x-ray: As above Lab data: Sodium 137, potassium 3.7, chloride 102, CO2 24, glucose 113, BUN 14, creatinine 0.63, AST 44, BNP 636, poc troponin 0 0.06, white count 6200 differential not obtained, hemoglobin 12.3, platelets 398,000 Medications and treatments: Lasix 20 mg IV x1  1-Acute hypoxemic respiratory failure. Related to HF  exacerbation. Resolved with IV lasix.   2-Acute diastolic HF exacerbation.  Received 60 mg IV lasix yesterday. 40 mg IV today.  She was taking lasix as needed.  She is negative 1 L. Weight down to 85 from 93.  She will be discharge on 20 mg daily.  Close follow up with cardiology.   3-Tachycardia, PVC. Started on low dose coreg. Blood pressure in the 90. She will be discharge on one a day coreg. Holder parameter. Her BP on ambulation increase to 110.  If she has been having tachycardia, that have could let her go in to HF exacerbation.  Close follow up with cardiology. Adjust coreg as tolerated.   Hypothyroidism, adult Continue current Synthroid 50 mcg daily  HTN; hold lisinopril , patient started on coreg. BP was soft. Last BP 97/70./ asymptomatic.  She is stable for discharge.     Discharge Diagnoses:  Principal Problem:   Acute on chronic diastolic heart failure (HCC) Active Problems:   Hypertension   Hypothyroidism, adult   Acute respiratory insufficiency    Discharge Instructions  Discharge Instructions    Diet - low sodium heart healthy   Complete by:  As directed    Diet - low sodium heart healthy   Complete by:  As directed    Diet - low sodium heart healthy   Complete by:  As directed    Increase activity slowly   Complete  by:  As directed    Increase activity slowly   Complete by:  As directed    Increase activity slowly   Complete by:  As directed      Allergies as of 03/20/2017      Reactions   Bactrim [sulfamethoxazole-trimethoprim] Itching   Blisters   Clindamycin/lincomycin Other (See Comments)   GI upset   Macrobid [nitrofurantoin Macrocrystal] Itching   Penicillins Itching   Has patient had a PCN reaction causing immediate rash, facial/tongue/throat swelling, SOB or lightheadedness with hypotension Unknown Has patient had a PCN reaction causing severe rash involving mucus membranes or skin necrosis: Unknown Has patient had a PCN reaction that  required hospitalization: Unknown Has patient had a PCN reaction occurring within the last 10 years:Unknown If all of the above answers are "NO", then may proceed with Cephalosporin use.   Sulfa Antibiotics Itching   Zofran [ondansetron Hcl] Hives      Medication List    STOP taking these medications   lisinopril 20 MG tablet Commonly known as:  PRINIVIL,ZESTRIL     TAKE these medications   acetaminophen 500 MG tablet Commonly known as:  TYLENOL Take 500 mg every 6 (six) hours as needed by mouth for mild pain.   carvedilol 3.125 MG tablet Commonly known as:  COREG Take 1 tablet (3.125 mg total) daily by mouth. Hold for SBP less than 110 Start taking on:  03/21/2017   EYE VITAMINS PO Take 1 tablet by mouth 2 (two) times daily.   furosemide 20 MG tablet Commonly known as:  LASIX Take 1 tablet (20 mg total) daily by mouth. If your weight increase by 2 pound in 24 hours, take an extra dose of lasix. Start taking on:  03/21/2017 What changed:    when to take this  reasons to take this  additional instructions   levothyroxine 50 MCG tablet Commonly known as:  SYNTHROID, LEVOTHROID TAKE 2 [place]. TAKE 1 TABLET ALL OTHER DAYS What changed:  See the new instructions.   potassium chloride SA 20 MEQ tablet Commonly known as:  K-DUR,KLOR-CON Take 1 tablet (20 mEq total) daily by mouth. Start taking on:  03/21/2017   triamcinolone cream 0.1 % Commonly known as:  KENALOG Apply 1 application topically 2 (two) times daily. What changed:    when to take this  reasons to take this   VITAMIN B-2 PO Take 1 tablet by mouth daily.   VITAMIN D PO Take 1 capsule by mouth daily.      Follow-up Information    Lucretia Kern, DO Follow up on 03/28/2017.   Specialty:  Family Medicine Why:  follow-up @ 8:15, arrive at [place] information: [place] [address] [phone]        Home, [facility] Follow up.   Specialty:   [place] Why:  They will continue to do your home health care at your home Contact information: [place] [address] [phone]        Minus Breeding, MD Follow up in 1 week(s).   Specialty:  Cardiology Contact information: [address] [place] [address] [phone]          Allergies  Allergen Reactions  . Bactrim [Sulfamethoxazole-Trimethoprim] Itching    Blisters   . Clindamycin/Lincomycin Other (See Comments)    GI upset  . Macrobid [Nitrofurantoin Macrocrystal] Itching  . Penicillins Itching    Has patient had a  PCN reaction causing immediate rash, facial/tongue/throat swelling, SOB or lightheadedness with hypotension Unknown Has patient had a PCN reaction causing severe rash involving mucus membranes or skin necrosis: Unknown Has patient had a PCN reaction that required hospitalization: Unknown Has patient had a PCN reaction occurring within the last 10 years:Unknown If all of the above answers are "NO", then may proceed with Cephalosporin use.   . Sulfa Antibiotics Itching  . Zofran [Ondansetron Hcl] Hives    Consultations:  none   Procedures/Studies: Dg Chest 2 View  Result Date: 03/19/2017 CLINICAL DATA:  Shortness of Breath EXAM: CHEST  2 VIEW COMPARISON:  02/27/2017 FINDINGS: Cardiac shadow remains enlarged. Vascular congestion is noted with significant interstitial edema increased from the prior exam consistent with worsening CHF. Small effusions are noted bilaterally. Some left basilar atelectasis is seen increased from the prior exam. No bony abnormality is noted. IMPRESSION: Worsening CHF Electronically Signed   By: Inez Catalina M.D.   On: 03/19/2017 07:56   Dg Chest 2 View  Result Date: 02/27/2017 CLINICAL DATA:  Follow-up of a closed fracture of the sternum. The patient is complaining of shortness of breath. There is a history of hypertension, Previous episodes of CHF, mitral regurgitation,  history of TIA. EXAM: CHEST  2 VIEW COMPARISON:  Chest x-ray of February 01, 2017 FINDINGS: The known displaced upper sternal fracture is not as well demonstrated today. The fracture lines are no longer apparent. No retrosternal hematoma is observed. The lungs remain mildly hyperinflated with hemidiaphragm flattening. The interstitial markings are diffusely increased and more conspicuous than on the previous study. The cardiac silhouette remains enlarged and the pulmonary vascularity is mildly engorged. There is reverse S shaped thoracolumbar scoliosis. There is tortuosity of the descending thoracic aorta with mural calcification. The T5, T9, and T10 compression fractures appears stable. IMPRESSION: CHF with mild interstitial edema superimposed upon chronically increased interstitial markings. This has worsened since the previous study. No discrete pneumonia. The known displaced mid sternal fracture is less well demonstrated on today's study. No retrosternal hematoma is observed. Stable appearance of a thoracic compression fractures. Thoracic aortic atherosclerosis. Electronically Signed   By: David  Martinique M.D.   On: 02/27/2017 11:39      Subjective: She is breathing back to baseline. She is feeling better. She wants to go home. She was able to ambulate on the hall and her BP was in the 110.   Discharge Exam: Vitals:   03/20/17 1215 03/20/17 1502  BP: 90/62 (!) 90/53  Pulse: 82 88  Resp:    Temp:    SpO2:  98%   Vitals:   03/20/17 0543 03/20/17 1202 03/20/17 1215 03/20/17 1502  BP: (!) 142/84 (!) 86/67 90/62 (!) 90/53  Pulse: (!) 108 85 82 88  Resp: 18 18    Temp: 97.8 F (36.6 C)     TempSrc: Oral Oral    SpO2: 97% 97%  98%  Weight: 39 kg (85 lb 14.4 oz)     Height:        General: Pt is alert, awake, not in acute distress Cardiovascular: RRR, S1/S2 +, no rubs, no gallops positive murmur Respiratory: CTA bilaterally, no wheezing, no rhonchi Abdominal: Soft, NT, ND, bowel sounds  + Extremities: no edema, no cyanosis    The results of significant diagnostics from this hospitalization (including imaging, microbiology, ancillary and laboratory) are listed below for reference.     Microbiology: No results found for this or any previous visit (from the past 240 hour(s)).  Labs: BNP (last 3 results) Recent Labs    02/03/17 1551 03/19/17 0802  BNP 391.6* 038.8*   Basic Metabolic Panel: Recent Labs  Lab 03/19/17 0802 03/20/17 0428  NA 137 138  K 3.7 3.8  CL 102 101  CO2 24 29  GLUCOSE 113* 118*  BUN 14 14  CREATININE 0.63 0.75  CALCIUM 8.7* 8.4*   Liver Function Tests: Recent Labs  Lab 03/19/17 0802  AST 44*  ALT 28  ALKPHOS 114  BILITOT 0.5  PROT 6.8  ALBUMIN 3.0*   No results for input(s): LIPASE, AMYLASE in the last 168 hours. No results for input(s): AMMONIA in the last 168 hours. CBC: Recent Labs  Lab 03/19/17 0802  WBC 6.2  HGB 12.3  HCT 38.3  MCV 97.5  PLT 398   Cardiac Enzymes: No results for input(s): CKTOTAL, CKMB, CKMBINDEX, TROPONINI in the last 168 hours. BNP: Invalid input(s): POCBNP CBG: No results for input(s): GLUCAP in the last 168 hours. D-Dimer No results for input(s): DDIMER in the last 72 hours. Hgb A1c No results for input(s): HGBA1C in the last 72 hours. Lipid Profile No results for input(s): CHOL, HDL, LDLCALC, TRIG, CHOLHDL, LDLDIRECT in the last 72 hours. Thyroid function studies No results for input(s): TSH, T4TOTAL, T3FREE, THYROIDAB in the last 72 hours.  Invalid input(s): FREET3 Anemia work up No results for input(s): VITAMINB12, FOLATE, FERRITIN, TIBC, IRON, RETICCTPCT in the last 72 hours. Urinalysis    Component Value Date/Time   BILIRUBINUR n 03/16/2013 1504   PROTEINUR n 03/16/2013 1504   UROBILINOGEN 0.2 03/16/2013 1504   NITRITE n 03/16/2013 1504   LEUKOCYTESUR large (3+) 03/16/2013 1504   Sepsis Labs Invalid input(s): PROCALCITONIN,  WBC,  LACTICIDVEN Microbiology No results  found for this or any previous visit (from the past 240 hour(s)).   Time coordinating discharge: Over 30 minutes  SIGNED:   Elmarie Shiley, MD  [facility] 03/20/2017, 4:01 PM Pager   If 7PM-7AM, please contact night-coverage [email or web address] Password [email or web address]

## 2017-03-20 NOTE — Evaluation (Signed)
Occupational Therapy Evaluation Patient Details Name: Lori [name] MRN: [id number] DOB: Oct 05, 1918 Today's Date: 03/20/2017    History of Present Illness This 83 yo. female admitted with SOB.  In ED she was hypertensive, tachycardic and tachypneic.   Dx: acute respiratory insufficiency secondary to chronic diastolic HF with moderate to severe mitral regurgitation.   PMH includes:  macular degeration, CVA, PNA,    Clinical Impression   Patient evaluated by Occupational Therapy with no further acute OT needs identified. All education has been completed and the patient has no further questions. Pt currently requires set up - min A for ADLs.  She lives with her daughter who is able to assist at discharge.  All further OT needs can be addressed by [facility].  No equipment needs. OT is signing off. Thank you for this referral.      Follow Up Recommendations  Home health OT;Other (comment)(recommend OT for low vision rehab - spoke with CM )    Equipment Recommendations       Recommendations for Other Services       Precautions / Restrictions Precautions Precautions: Fall Precaution Comments: Pt experienced a fall in 5/18 resulting in lumbar fractures per daugthter       Mobility Bed Mobility               General bed mobility comments: Pt up in chair   Transfers Overall transfer level: Needs assistance Equipment used: Straight cane Transfers: Sit to/from Stand;Stand Pivot Transfers Sit to Stand: Min guard Stand pivot transfers: Min guard       General transfer comment: Pt slow to rise.  Min guard assist for safety     Balance Overall balance assessment: Needs assistance Sitting-balance support: Feet supported Sitting balance-Leahy Scale: Good     Standing balance support: Single extremity supported Standing balance-Leahy Scale: Poor Standing balance comment: requires UE support                            ADL either performed or assessed with clinical  judgement   ADL Overall ADL's : Needs assistance/impaired Eating/Feeding: Set up   Grooming: Wash/dry hands;Wash/dry face;Oral care;Brushing hair;Min guard;Standing   Upper Body Bathing: Set up;Supervision/ safety;Sitting   Lower Body Bathing: Minimal assistance;Sit to/from stand   Upper Body Dressing : Supervision/safety;Set up;Sitting   Lower Body Dressing: Minimal assistance;Sit to/from stand   Toilet Transfer: Min guard;Ambulation;Comfort height toilet(spc)   Toileting- Clothing Manipulation and Hygiene: Min guard;Sit to/from stand       Functional mobility during ADLs: Min guard;Rolling walker General ADL Comments: discussed need to sit to shower with pt and she agreed      Vision Baseline Vision/History: Wears glasses;Macular Degeneration Wears Glasses: At all times Patient Visual Report: No change from baseline Additional Comments: discussed community resources for low vision rehab with both pt and daughter      Perception     Praxis      Pertinent Vitals/Pain Pain Assessment: No/denies pain     Hand Dominance Right   Extremity/Trunk Assessment Upper Extremity Assessment Upper Extremity Assessment: Generalized weakness   Lower Extremity Assessment Lower Extremity Assessment: Defer to PT evaluation   Cervical / Trunk Assessment Cervical / Trunk Assessment: Kyphotic   Communication Communication Communication: HOH   Cognition Arousal/Alertness: Awake/alert Behavior During Therapy: WFL for tasks assessed/performed Overall Cognitive Status: History of cognitive impairments - at baseline  General Comments: daughter reports pt has had a progressive decline in memory over the last several months.  She currently is at baseline    General Comments  BP seated 98/58; standing 106/67  MD and RN notified     Exercises     Shoulder Instructions      Home Living Family/patient expects to be discharged to::  Private residence Living Arrangements: Children Available Help at Discharge: Family;Available 24 hours/day Type of Home: House Home Access: Stairs to enter [employer] of Steps: 3 Entrance Stairs-Rails: Left Home Layout: One level     Bathroom Shower/Tub: Tub/shower unit;Walk-in shower   Bathroom Toilet: Standard     Home Equipment: Cane - single point;Tub bench          Prior Functioning/Environment Level of Independence: Needs assistance  Gait / Transfers Assistance Needed: ambulates with SPC.  Has been receiving HHPT  ADL's / Homemaking Assistance Needed: Pt is supervision - mod I with ADLs.  Daughter performs IADLs.     Comments: Daughter reports pt with macular degeneration and has had a reduction in her ability to perform leisure activities and it interferes with ADLs and mobillity         OT Problem List: Decreased strength;Decreased activity tolerance;Impaired balance (sitting and/or standing);Decreased safety awareness;Decreased cognition      OT Treatment/Interventions:      OT Goals(Current goals can be found in the care plan section) Acute Rehab OT Goals Patient Stated Goal: to go home  OT Goal Formulation: All assessment and education complete, DC therapy  OT Frequency:     Barriers to D/C:            Co-evaluation PT/OT/SLP Co-Evaluation/Treatment: Yes(overalap of evaluations )     OT goals addressed during session: ADL's and self-care      AM-PAC PT "6 Clicks" Daily Activity     Outcome Measure Help from another person eating meals?: A Little Help from another person taking care of personal grooming?: A Little Help from another person toileting, which includes using toliet, bedpan, or urinal?: A Little Help from another person bathing (including washing, rinsing, drying)?: A Little Help from another person to put on and taking off regular upper body clothing?: A Little Help from another person to put on and taking off regular lower  body clothing?: A Little 6 Click Score: 18   End of Session    Activity Tolerance: Patient tolerated treatment well Patient left: Other (comment)(with PT )  OT Visit Diagnosis: Unsteadiness on feet (R26.81)                Time: 6712-4580 OT Time Calculation (min): 42 min Charges:  OT General Charges $OT Visit: 1 Visit OT Evaluation $OT Eval Moderate Complexity: 1 Mod G-Codes: OT G-codes **NOT FOR INPATIENT CLASS** Functional Assessment Tool Used: AM-PAC 6 Clicks Daily Activity Functional Limitation: Self care Self Care Current Status (D9833): At least 20 percent but less than 40 percent impaired, limited or restricted Self Care Goal Status (A2505): At least 20 percent but less than 40 percent impaired, limited or restricted Self Care Discharge Status [phone]): At least 20 percent but less than 40 percent impaired, limited or restricted   [facility], OTR/L [phone]   Lucille Passy M 03/20/2017, 3:42 PM

## 2017-03-20 NOTE — Clinical Social Work Note (Addendum)
CSW acknowledges SNF consult. Patient is currently under Medicare observation and has not had a 3-day qualifying inpatient stay in the past 30 days. CSW will continue to follow.  Dayton Scrape, [place] [phone]  4:04 pm Patient has orders to discharge home today. CSW signing off.  Dayton Scrape, [place]

## 2017-03-20 NOTE — Discharge Instructions (Signed)
Take lasix by mouth daily. If your weight increases more than 2 pounds in 24 hours, take extra dose of lasix.  Please check your Blood pressure, if your BP is less than 110, do not take coreg.

## 2017-03-20 NOTE — Evaluation (Signed)
Physical Therapy Evaluation Patient Details Name: Lori [name] MRN: [id number] DOB: 12/22/1918 Today's Date: 03/20/2017   History of Present Illness  Pt is a 83 yo. female admitted on 03/19/17 with SOB. In ED she was hypertensive, tachycardic and tachypneic. Dx: acute respiratory insufficiency secondary to chronic diastolic HF with moderate to severe mitral regurgitation. PMH includes: macular degeration, HTN, CVA, PNA.    Clinical Impression  Pt presents with decreased balance and an overall decrease in functional mobility secondary to above. PTA, pt lives with daughter and has been receiving HHPT secondary to generalized weakness and balance issues; pt plans to return home with daughter who will be available for 24/7 assist. Today, pt able to transfer, amb, and ascend/descend stairs with SPC; intermittent min guard for balance with 2x self-corrected LOB. Pt would benefit from continued acute PT services to maximize functional mobility and independence prior to d/c with continued HHPT.     Follow Up Recommendations Home health PT;Supervision/Assistance - 24 hour    Equipment Recommendations  None recommended by PT    Recommendations for Other Services       Precautions / Restrictions Precautions Precautions: Fall Precaution Comments: Pt experienced a fall in 5/18 resulting in lumbar fractures per daugthter  Restrictions Weight Bearing Restrictions: No      Mobility  Bed Mobility               General bed mobility comments: Pt received ambulating in hallway with OT  Transfers Overall transfer level: Needs assistance Equipment used: Straight cane Transfers: Sit to/from Stand Sit to Stand: Min guard Stand pivot transfers: Min guard       General transfer comment: Min guard for safety into sitting; pt with decreased eccentric control  Ambulation/Gait Ambulation/Gait assistance: Min guard Ambulation Distance (Feet): 180 Feet Assistive device: Straight cane Gait  Pattern/deviations: Step-through pattern;Decreased stride length;Trunk flexed Gait velocity: Decreased Gait velocity interpretation: <1.8 ft/sec, indicative of risk for recurrent falls General Gait Details: Amb with SPC and min guard for balance; 2x episode of self-corrected LOB when pt holds cane and is not using it. Educ on importance of correct cane technique to help prevent this  Stairs Stairs: Yes Stairs assistance: Min guard Stair Management: One rail Left;Alternating pattern;With cane Number of Stairs: 4 General stair comments: Ascend/descend steps with SPC and single UE support on L rail; min guard for safety as pt with poor technique with SPC. Able to correct with education  Wheelchair Mobility    Modified Rankin (Stroke Patients Only)       Balance Overall balance assessment: Needs assistance Sitting-balance support: Feet supported Sitting balance-Leahy Scale: Good     Standing balance support: Single extremity supported Standing balance-Leahy Scale: Poor Standing balance comment: requires UE support                              Pertinent Vitals/Pain Pain Assessment: No/denies pain    Home Living Family/patient expects to be discharged to:: Private residence Living Arrangements: Children Available Help at Discharge: Family;Available 24 hours/day Type of Home: House Home Access: Stairs to enter Entrance Stairs-Rails: Left Entrance Stairs-Number of Steps: 3 Home Layout: One level Home Equipment: Cane - single point;Tub bench      Prior Function Level of Independence: Needs assistance   Gait / Transfers Assistance Needed: ambulates with SPC.  Has been receiving HHPT   ADL's / Homemaking Assistance Needed: Pt is supervision - mod I with ADLs.  Daughter  performs IADLs.    Comments: Daughter reports pt with macular degeneration and has had a reduction in her ability to perform leisure activities and it interferes with ADLs and mobillity      Hand  Dominance   Dominant Hand: Right    Extremity/Trunk Assessment   Upper Extremity Assessment Upper Extremity Assessment: Generalized weakness    Lower Extremity Assessment Lower Extremity Assessment: Generalized weakness    Cervical / Trunk Assessment Cervical / Trunk Assessment: Kyphotic  Communication   Communication: HOH  Cognition Arousal/Alertness: Awake/alert Behavior During Therapy: WFL for tasks assessed/performed Overall Cognitive Status: History of cognitive impairments - at baseline                                 General Comments: daughter reports pt has had a progressive decline in memory over the last several months.  She currently is at baseline       General Comments General comments (skin integrity, edema, etc.): BP seated 98/58; standing 106/67; post-amb 116/78. Pt asymptomatic throughout session. RN aware    Exercises     Assessment/Plan    PT Assessment Patient needs continued PT services  PT Problem List Decreased strength;Decreased activity tolerance;Decreased balance;Decreased mobility;Decreased knowledge of use of DME       PT Treatment Interventions DME instruction;Gait training;Stair training;Functional mobility training;Therapeutic activities;Balance training;Therapeutic exercise;Patient/family education    PT Goals (Current goals can be found in the Care Plan section)  Acute Rehab PT Goals Patient Stated Goal: to go home  PT Goal Formulation: With patient/family Time For Goal Achievement: 04/03/17 Potential to Achieve Goals: Good    Frequency Min 3X/week   Barriers to discharge        Co-evaluation       OT goals addressed during session: ADL's and self-care       AM-PAC PT "6 Clicks" Daily Activity  Outcome Measure Difficulty turning over in bed (including adjusting bedclothes, sheets and blankets)?: None Difficulty moving from lying on back to sitting on the side of the bed? : None Difficulty sitting down on  and standing up from a chair with arms (e.g., wheelchair, bedside commode, etc,.)?: A Little Help needed moving to and from a bed to chair (including a wheelchair)?: A Little Help needed walking in hospital room?: A Little Help needed climbing 3-5 steps with a railing? : A Little 6 Click Score: 20    End of Session Equipment Utilized During Treatment: Gait belt Activity Tolerance: Patient tolerated treatment well Patient left: in chair;with call bell/phone within reach;with family/visitor present Nurse Communication: Mobility status PT Visit Diagnosis: Unsteadiness on feet (R26.81);Muscle weakness (generalized) (M62.81)    Time: 7829-5621 PT Time Calculation (min) (ACUTE ONLY): 14 min   Charges:   PT Evaluation $PT Eval Moderate Complexity: 1 Mod     PT G Codes:   PT G-Codes **NOT FOR INPATIENT CLASS** Functional Assessment Tool Used: AM-PAC 6 Clicks Basic Mobility Functional Limitation: Mobility: Walking and moving around Mobility: Walking and Moving Around Current Status (H0865): At least 20 percent but less than 40 percent impaired, limited or restricted Mobility: Walking and Moving Around Goal Status [phone]): At least 1 percent but less than 20 percent impaired, limited or restricted   Mabeline Caras, PT, DPT Acute Rehab Services  Pager: [place] 03/20/2017, 4:00 PM

## 2017-03-20 NOTE — Patient Outreach (Signed)
[facility]) Care Management  03/20/2017  Lori [name] [birth year] [id number]   Care Coordination-Client noted to be in hospital (observation). Hospital liaison notified. RNCM will continue to follow to follow up once transitioned to home.  [name], RN, MSN, [place] Coordinator Cell: [phone]

## 2017-03-21 ENCOUNTER — Telehealth: Payer: Self-pay

## 2017-03-21 ENCOUNTER — Other Ambulatory Visit: Payer: Self-pay

## 2017-03-21 LAB — T3: T3, Total: 76 ng/dL (ref 71–180)

## 2017-03-21 NOTE — Patient Outreach (Signed)
[facility]) Care Management  03/21/2017  DATHA KISSINGER 29-Nov-1918 [id number]   Subjective: "she is better.   Objective: none   Assessment: 83 year old with recent admission to hospital for pneumonia/heart failure. History of HTN, TIA, hypothyroidism, colon cancer. Client with observation stay (11/6-11/7) for acute on chronic heart failure. Continues to be active with [facility].  RNCM called to follow up. Daughter, [name] (primary caregiver) reports client is doing better. She states that Mrs. Lori [name] slept well during the night and now she is up watching television.  Medications reviewed. Daughter is concerned about starting new medication Carvedilol. RNCM reinforced lisinopril stopped. Reinforced as per discharge instructions to take blood pressure and if top number is less than 110 to hold carvedilol. Daughter acknowledges she has an automatic blood pressure monitor. RNCM discussed the importance of calling to schedule an appointment with cardiologist.  Client daughter reports client is scheduled to transition to [facility] next week, therefore request telephonic follow up next week.  Plan: telephonic call next week.  [name], RN, MSN, [place] Coordinator Cell: [phone]

## 2017-03-21 NOTE — Telephone Encounter (Signed)
Attempted to reach patient to complete TCM follow-up call. Unable to reach patient. Message left on voicemail asking patient to return call to office.   

## 2017-03-22 ENCOUNTER — Telehealth: Payer: Self-pay | Admitting: Family Medicine

## 2017-03-22 DIAGNOSIS — Z9181 History of falling: Secondary | ICD-10-CM | POA: Diagnosis not present

## 2017-03-22 DIAGNOSIS — I509 Heart failure, unspecified: Secondary | ICD-10-CM | POA: Diagnosis not present

## 2017-03-22 DIAGNOSIS — I110 Hypertensive heart disease with heart failure: Secondary | ICD-10-CM | POA: Diagnosis not present

## 2017-03-22 DIAGNOSIS — M8008XD Age-related osteoporosis with current pathological fracture, vertebra(e), subsequent encounter for fracture with routine healing: Secondary | ICD-10-CM | POA: Diagnosis not present

## 2017-03-22 DIAGNOSIS — Z8701 Personal history of pneumonia (recurrent): Secondary | ICD-10-CM | POA: Diagnosis not present

## 2017-03-22 DIAGNOSIS — M8000XD Age-related osteoporosis with current pathological fracture, unspecified site, subsequent encounter for fracture with routine healing: Secondary | ICD-10-CM | POA: Diagnosis not present

## 2017-03-22 NOTE — Telephone Encounter (Signed)
Unable to reach patient at time of TCM Call. Left message for patient to return call when available.  

## 2017-03-22 NOTE — Telephone Encounter (Signed)
PT IS getting ready  to move into assistant living facility week. Pt is crying a lot and needs [facility] [address]. Levada Dy also would like verbal order to continue OT,PT and skill nursing. Pt has an appt on 04-01-17. Please advise

## 2017-03-25 ENCOUNTER — Other Ambulatory Visit: Payer: Self-pay

## 2017-03-25 ENCOUNTER — Telehealth: Payer: Self-pay | Admitting: Family Medicine

## 2017-03-25 DIAGNOSIS — I509 Heart failure, unspecified: Secondary | ICD-10-CM | POA: Diagnosis not present

## 2017-03-25 DIAGNOSIS — M8008XD Age-related osteoporosis with current pathological fracture, vertebra(e), subsequent encounter for fracture with routine healing: Secondary | ICD-10-CM | POA: Diagnosis not present

## 2017-03-25 DIAGNOSIS — I110 Hypertensive heart disease with heart failure: Secondary | ICD-10-CM | POA: Diagnosis not present

## 2017-03-25 DIAGNOSIS — Z8701 Personal history of pneumonia (recurrent): Secondary | ICD-10-CM | POA: Diagnosis not present

## 2017-03-25 DIAGNOSIS — M8000XD Age-related osteoporosis with current pathological fracture, unspecified site, subsequent encounter for fracture with routine healing: Secondary | ICD-10-CM | POA: Diagnosis not present

## 2017-03-25 DIAGNOSIS — Z9181 History of falling: Secondary | ICD-10-CM | POA: Diagnosis not present

## 2017-03-25 NOTE — Telephone Encounter (Signed)
I left a message for Lori [name] to return my call.

## 2017-03-25 NOTE — Telephone Encounter (Signed)
I called [name] and informed him the verbal order was approved and given to Levada Dy in a previous note today.

## 2017-03-25 NOTE — Telephone Encounter (Signed)
Spoke with Lori [name] and advised of all. She will have pt's daughter call if they would like appt sooner than one already scheduled. Nothing further needed at this time.

## 2017-03-25 NOTE — Patient Outreach (Signed)
[facility]) Care Management  03/25/2017  Lori [name] 06-Apr-1919 [id number]   Subjective: none  Objective: none   Assessment: 83 year old with recent admission to hospital for pneumonia/heart failure. History of HTN, TIA, hypothyroidism, colon cancer. Client with observation stay (11/6-11/7) for acute on chronic heart failure. Per daughter, client is scheduled to be transitioned to assisted living facility this week.  RNCM called to follow up. No answer. HIPPA compliant message left.  Plan: await return call; follow up next week if no response.  [name], RN, MSN, [place] Coordinator Cell: [phone]

## 2017-03-25 NOTE — Telephone Encounter (Signed)
Jim with [facility] requesting verbal orders for home health OT 1 wk /3

## 2017-03-25 NOTE — Telephone Encounter (Signed)
can discussed medications for depression at her appointment.  Can set up appointment sooner if she feels this is needed.  (Please make sure a 30-minute appointment either way).  Okay to continue the PT and OT if needed.

## 2017-03-26 ENCOUNTER — Encounter: Payer: Self-pay | Admitting: Family Medicine

## 2017-03-26 ENCOUNTER — Telehealth: Payer: Self-pay | Admitting: Cardiology

## 2017-03-26 ENCOUNTER — Ambulatory Visit (INDEPENDENT_AMBULATORY_CARE_PROVIDER_SITE_OTHER): Payer: Medicare Other | Admitting: Family Medicine

## 2017-03-26 VITALS — BP 120/60 | HR 81 | Temp 97.7°F | Ht 60.0 in | Wt 90.2 lb

## 2017-03-26 DIAGNOSIS — I5033 Acute on chronic diastolic (congestive) heart failure: Secondary | ICD-10-CM | POA: Diagnosis not present

## 2017-03-26 DIAGNOSIS — R634 Abnormal weight loss: Secondary | ICD-10-CM

## 2017-03-26 DIAGNOSIS — I340 Nonrheumatic mitral (valve) insufficiency: Secondary | ICD-10-CM

## 2017-03-26 DIAGNOSIS — E039 Hypothyroidism, unspecified: Secondary | ICD-10-CM

## 2017-03-26 DIAGNOSIS — M8000XD Age-related osteoporosis with current pathological fracture, unspecified site, subsequent encounter for fracture with routine healing: Secondary | ICD-10-CM | POA: Diagnosis not present

## 2017-03-26 DIAGNOSIS — I509 Heart failure, unspecified: Secondary | ICD-10-CM | POA: Diagnosis not present

## 2017-03-26 DIAGNOSIS — I110 Hypertensive heart disease with heart failure: Secondary | ICD-10-CM | POA: Diagnosis not present

## 2017-03-26 DIAGNOSIS — I10 Essential (primary) hypertension: Secondary | ICD-10-CM | POA: Diagnosis not present

## 2017-03-26 DIAGNOSIS — Z9181 History of falling: Secondary | ICD-10-CM | POA: Diagnosis not present

## 2017-03-26 DIAGNOSIS — M8008XD Age-related osteoporosis with current pathological fracture, vertebra(e), subsequent encounter for fracture with routine healing: Secondary | ICD-10-CM | POA: Diagnosis not present

## 2017-03-26 DIAGNOSIS — Z8701 Personal history of pneumonia (recurrent): Secondary | ICD-10-CM | POA: Diagnosis not present

## 2017-03-26 NOTE — Patient Instructions (Addendum)
BEFORE YOU LEAVE: -follow up: ensure follow up in 3-4 months. Cancel other follow up appointments.  Make sure to follow up with your cardiologist next week.  Try to eat three healthy meals today with clear nutritional supplement with 1-2 meals daily.   Let us know and schedule follow up sooner if any depression or worsening anxiety.

## 2017-03-26 NOTE — Telephone Encounter (Signed)
Per home health nurse. Patient is taking furosemide plus additional daily for the past 3 days. Weight was 88 lbs when nurse saw her on 03/23/17 and her weight today is 86.8 lbs. Patient is more sob, crackles to right lower lobe today, some audible wheezing. Daughter concerned that she needed lasix d/t sob and abdomen being tight.   Per patient's daughter, patient does c/o sob, and tightness in her abdomen. Per daughter, patient was up last night c/o stomach tightness. Per daughter, patient had her last bowel movement 2 days ago and that her normal pattern for bm's is every 2 days. No c/o swelling in legs or feet, sleeping on one pill and this is normal for her per daughter, no c/o chest pain, fever, n/v, headache. Per daughter, patient did c/o light dizziness last night but it has resolved. Per daughter, since patient has been taking 40 mg of furosemide daily, her symptoms have lightened up some. Patient offered appt with Kellie Simmering on Friday 03/29/17 but daughter declined stating that that would be conflicting with other things they have going on that day. Daughter said that patient is seeing her PCP today at 3:30 pm. Daughter advised that appt will be canceled and that if symptoms got worse, to go to the ED for an evaluation. Daughter aware of appt with Hochrein in January 2019 and said she would call our office back to reschedule her hospital f/u appt. Daughter verbalized understanding of plan.

## 2017-03-26 NOTE — Telephone Encounter (Signed)
New Message     Pt c/o swelling: STAT is pt has developed SOB within 24 hours  1) How much weight have you gained and in what time span? 82 to 88 within a few hours   2) If swelling, where is the swelling located?  Tightness in the abdominal area   3) Are you currently taking a fluid pill? Yes they increased it to 40mg  at the hospital , and it is not helping   4) Are you currently SOB?  yes  5) Do you have a log of your daily weights (if so, list)?  no  6) Have you gained 3 pounds in a day or 5 pounds in a week? no  7) Have you traveled recently?  Was released from hospital  On  03/21/17

## 2017-03-26 NOTE — Progress Notes (Signed)
HPI:  Lori [name] is a pleasant 83 y.o. with a PMH significant for See below, here for a hospital follow up. See transitional care phone note in Epic. Per review of discharge documents and patient: Hospitalized 11/6 to 03/20/2017 Primary admitting complaint(s): Dyspnea, hypoxia, hypertension, tachycardia Primary admitting diagnosis (es) and treatment: Congestive heart failure, treated with IV Lasix.  Weight 193-83 in the hospital.  She was advised to follow-up closely with her cardiologist.  She was advised to take 20 mg of Lasix daily. Other significant diagnosis (es) and treatment: 1) tachycardia, she was placed on Coreg for this in the hospital. 2) hypothyroidism, she had stopped her medication at her last visit, she is now back on her thyroid medicine 3) her lisinopril was held in the hospital so the Coreg could be initiated for the elevated heart rate.  Her discharge notes it looks like her blood pressure is running on the low end. Per discharge document she has home health at home.  She is to see her cardiologist within a week of discharge.  Still does not look like she is seeing the palliative care folks.  I am not sure why this is not been done in the hospital during either stay nor why it has not happened outpatient yet.,  Follow up concerns per discharge document: Reports today: doing well. Had some bloating and distention abd yesterda - reports no pain. BMs ok today. No fevers, cough, congestion, melena, hematochezia, nausea, vomiting or diarrhea. No symptoms today. Daughter reports had some wt gain and SOB the last few days so the upped her lasix for a few days and daughter reports they will see her cardiologist next week. PT is moving to ALF at [facility]. Pt denies depression but is a little anxious about the move. She does not feels she need medication or therapy for this and declines today. Advise BMP per discharge doc request - they decline today and she plans to do this  with her cardiologist. Denies: sob, swelling, fevers, palpitations, cough, abd pain today   ROS: See pertinent positives and negatives per HPI.  Past Medical History:  Diagnosis Date  . Colon cancer ([place]) 2009   s/p colon resection  . Endometrial cancer ([place]) 1989   endometrial s/p hysterectomy, chemo and radiation  . Hearing loss   . History of blood transfusion   . Hypertension   . Macular degeneration   . Migraines   . Pneumonia   . Positive TB test   . Stroke [facility]) 2004   ? TIA, brief episode of speech issues - evaluate OH    Past Surgical History:  Procedure Laterality Date  . ABDOMINAL HYSTERECTOMY  1989  . APPENDECTOMY    . COLON RESECTION  08/27/2007  . GALLBLADDER SURGERY  1970  . SUBDURAL HEMATOMA EVACUATION VIA CRANIOTOMY  2002  . TONSILLECTOMY AND ADENOIDECTOMY  1943    Family History  Problem Relation Age of Onset  . Arthritis Mother   . Heart disease Father   . Hyperlipidemia Father   . Hypertension Father   . Diabetes Sister   . Crohn's disease Daughter     Social History   Socioeconomic History  . Marital status: Widowed    Spouse name: None  . Number of children: 2  . Years of education: None  . Highest education level: None  Social Needs  . Financial resource strain: None  . Food insecurity - worry: None  . Food insecurity - inability: None  .  Transportation needs - medical: None  . Transportation needs - non-medical: None  Occupational History  . Occupation: retired  Tobacco Use  . Smoking status: Never Smoker  . Smokeless tobacco: Never Used  Substance and Sexual Activity  . Alcohol use: Yes    Comment: occ glass of wine- one every 2 months   . Drug use: No  . Sexual activity: None  Other Topics Concern  . None  Social History Narrative   Work or School: retired      Insurance risk surveyor Situation: lives with her daughter (HCPOA: Lauran Romanski)      Spiritual Beliefs: Christian      Lifestyle: walks              Current  Outpatient Medications:  .  acetaminophen (TYLENOL) 500 MG tablet, Take 500 mg every 6 (six) hours as needed by mouth for mild pain. , Disp: , Rfl:  .  carvedilol (COREG) 3.125 MG tablet, Take 1 tablet (3.125 mg total) daily by mouth. Hold for SBP less than 110, Disp: 30 tablet, Rfl: 0 .  Cholecalciferol (VITAMIN D PO), Take 1 capsule by mouth daily. , Disp: , Rfl:  .  furosemide (LASIX) 20 MG tablet, Take 1 tablet (20 mg total) daily by mouth. If your weight increase by 2 pound in 24 hours, take an extra dose of lasix., Disp: 60 tablet, Rfl: 1 .  levothyroxine (SYNTHROID, LEVOTHROID) 50 MCG tablet, TAKE 2 TABLETS ON MONDAY AND FRIDAY. TAKE 1 TABLET ALL OTHER DAYS (Patient taking differently: TAKE 100MCG ON MONDAY AND FRIDAYS, TAKE 50MCG  ALL OTHER DAYS), Disp: 144 tablet, Rfl: 1 .  Multiple Vitamins-Minerals (EYE VITAMINS PO), Take 1 tablet daily by mouth. , Disp: , Rfl:  .  potassium chloride SA (K-DUR,KLOR-CON) 20 MEQ tablet, Take 1 tablet (20 mEq total) daily by mouth., Disp: 30 tablet, Rfl: 0 .  Riboflavin (VITAMIN B-2 PO), Take 1 tablet by mouth daily. , Disp: , Rfl:  .  triamcinolone cream (KENALOG) 0.1 %, Apply 1 application topically 2 (two) times daily. (Patient taking differently: Apply 1 application daily as needed topically. ), Disp: 30 g, Rfl: 0  EXAM:  Vitals:   03/26/17 1536  BP: 120/60  Pulse: 81  Temp: 97.7 F (36.5 C)  SpO2: 96%    Body mass index is 17.62 kg/m.  GENERAL: vitals reviewed and listed above, alert, oriented, appears well hydrated and in no acute distress  HEENT: atraumatic, conjunttiva clear, no obvious abnormalities on inspection of external nose and ears  NECK: no obvious masses on inspection  LUNGS: clear to auscultation bilaterally, no wheezes, rales or rhonchi, good air movement  CV: HRRR, no peripheral edema  ABD: BS+ all 4 quadrants, soft, NTTP, no distention, guarding or rebound  MS: moves all extremities without noticeable abnormality,  walks with cane, slow  PSYCH: pleasant and cooperative, no obvious depression or anxiety  ASSESSMENT AND PLAN:  Discussed the following assessment and plan:  Acute on chronic diastolic heart failure (HCC)  Hypothyroidism, adult  Essential hypertension  Moderate to severe mitral regurgitation  Loss of weight  -daughter reports they plans to follow up with cardiologist next week, declined labs here today -advised daily thyroid medication -advised careful tracking of weight and fluid status and that they alert cardiology immediately if concerns -nutritional supplement advised -they were contacted by palliative team, but daughter delayed this - she reports she will set this up after the move to ALF -she declined treatment for mood and daughter prefers  to avoid medications, may consider cymbalta or CBT, but holding off for now per their preference -Wendie Simmer is to complete new FL 2 medication list for her assisted living facility given the changes in the hospital  -Patient advised to return or notify a doctor immediately if symptoms worsen or persist or new concerns arise.  Patient Instructions  BEFORE YOU LEAVE: -follow up: ensure follow up in 3-4 months. Cancel other follow up appointments.  Make sure to follow up with your cardiologist next week.  Try to eat three healthy meals today with clear nutritional supplement with 1-2 meals daily.   Let us know and schedule follow up sooner if any depression or worsening anxiety.    Colin Benton R., DO

## 2017-03-27 ENCOUNTER — Telehealth: Payer: Self-pay | Admitting: *Deleted

## 2017-03-27 NOTE — Telephone Encounter (Signed)
Caller name: Rosalee Kaufman, [place] Can be reached: [phone] Last visit: 03/26/17   Reason for call: Patient is schedule to move in to [facility] tomorrow (03/28/17) and needs new FL2 filled out. Per Beth, the FL2 form is only good for 30 days from the time it is filled out and the current FL57 is 10 days old. Eustaquio Maize is requesting a new FL2 be completed ASAP so that patient can move in as scheduled.  Beth is aware that PCP and assistant are out of office today.

## 2017-03-27 NOTE — Telephone Encounter (Signed)
Lori [name],   Please fill out per scanned prior form - except update medications per yesterday. Pt said only medications needed to be update? Place charge cover on top. The signed copy is already on your desk from yesterday. Thanks.

## 2017-03-28 ENCOUNTER — Inpatient Hospital Stay: Payer: Medicare Other | Admitting: Family Medicine

## 2017-03-28 DIAGNOSIS — Z0289 Encounter for other administrative examinations: Secondary | ICD-10-CM

## 2017-03-28 NOTE — Telephone Encounter (Signed)
Patient's daughter would like to pick up original FL2 form today. Please file at front desk.

## 2017-03-28 NOTE — Telephone Encounter (Signed)
A new FL-2 form was completed and faxed to [facility] at [phone].  I called her and left a detailed message at her voicemail with this information as well.

## 2017-03-28 NOTE — Telephone Encounter (Signed)
Form was placed in medical records folder for pick up and Lori [name] received this.

## 2017-03-29 ENCOUNTER — Encounter: Payer: Self-pay | Admitting: Internal Medicine

## 2017-03-29 ENCOUNTER — Ambulatory Visit: Payer: Medicare Other | Admitting: Cardiology

## 2017-03-29 DIAGNOSIS — S098XXA Other specified injuries of head, initial encounter: Secondary | ICD-10-CM | POA: Diagnosis not present

## 2017-03-29 DIAGNOSIS — M8000XD Age-related osteoporosis with current pathological fracture, unspecified site, subsequent encounter for fracture with routine healing: Secondary | ICD-10-CM | POA: Diagnosis not present

## 2017-03-29 DIAGNOSIS — M8008XD Age-related osteoporosis with current pathological fracture, vertebra(e), subsequent encounter for fracture with routine healing: Secondary | ICD-10-CM | POA: Diagnosis not present

## 2017-03-29 DIAGNOSIS — Z9181 History of falling: Secondary | ICD-10-CM | POA: Diagnosis not present

## 2017-03-29 DIAGNOSIS — S0181XA Laceration without foreign body of other part of head, initial encounter: Secondary | ICD-10-CM | POA: Diagnosis not present

## 2017-03-29 DIAGNOSIS — Z8701 Personal history of pneumonia (recurrent): Secondary | ICD-10-CM | POA: Diagnosis not present

## 2017-03-29 DIAGNOSIS — I509 Heart failure, unspecified: Secondary | ICD-10-CM | POA: Diagnosis not present

## 2017-03-29 DIAGNOSIS — I110 Hypertensive heart disease with heart failure: Secondary | ICD-10-CM | POA: Diagnosis not present

## 2017-03-29 NOTE — Progress Notes (Signed)
Opened in error.      Review of Systems

## 2017-03-29 NOTE — Patient Instructions (Signed)
Opened in error

## 2017-03-30 ENCOUNTER — Emergency Department (HOSPITAL_COMMUNITY)
Admission: EM | Admit: 2017-03-30 | Discharge: 2017-03-30 | Disposition: A | Discharge status: Skilled Nursing Facility | Payer: Medicare Other | Attending: Emergency Medicine | Admitting: Emergency Medicine

## 2017-03-30 ENCOUNTER — Other Ambulatory Visit: Payer: Self-pay

## 2017-03-30 ENCOUNTER — Encounter (HOSPITAL_COMMUNITY): Payer: Self-pay | Admitting: Emergency Medicine

## 2017-03-30 ENCOUNTER — Emergency Department (HOSPITAL_COMMUNITY): Payer: Medicare Other

## 2017-03-30 DIAGNOSIS — Y999 Unspecified external cause status: Secondary | ICD-10-CM | POA: Diagnosis not present

## 2017-03-30 DIAGNOSIS — W19XXXA Unspecified fall, initial encounter: Secondary | ICD-10-CM

## 2017-03-30 DIAGNOSIS — S0101XA Laceration without foreign body of scalp, initial encounter: Secondary | ICD-10-CM | POA: Insufficient documentation

## 2017-03-30 DIAGNOSIS — Z85038 Personal history of other malignant neoplasm of large intestine: Secondary | ICD-10-CM | POA: Insufficient documentation

## 2017-03-30 DIAGNOSIS — I110 Hypertensive heart disease with heart failure: Secondary | ICD-10-CM | POA: Insufficient documentation

## 2017-03-30 DIAGNOSIS — E039 Hypothyroidism, unspecified: Secondary | ICD-10-CM | POA: Insufficient documentation

## 2017-03-30 DIAGNOSIS — Y9389 Activity, other specified: Secondary | ICD-10-CM | POA: Diagnosis not present

## 2017-03-30 DIAGNOSIS — Z79899 Other long term (current) drug therapy: Secondary | ICD-10-CM | POA: Insufficient documentation

## 2017-03-30 DIAGNOSIS — W06XXXA Fall from bed, initial encounter: Secondary | ICD-10-CM | POA: Diagnosis not present

## 2017-03-30 DIAGNOSIS — I5032 Chronic diastolic (congestive) heart failure: Secondary | ICD-10-CM | POA: Diagnosis not present

## 2017-03-30 DIAGNOSIS — R4182 Altered mental status, unspecified: Secondary | ICD-10-CM | POA: Diagnosis not present

## 2017-03-30 DIAGNOSIS — Y92122 Bedroom in nursing home as the place of occurrence of the external cause: Secondary | ICD-10-CM | POA: Diagnosis not present

## 2017-03-30 DIAGNOSIS — S0990XA Unspecified injury of head, initial encounter: Secondary | ICD-10-CM | POA: Diagnosis not present

## 2017-03-30 DIAGNOSIS — Z8542 Personal history of malignant neoplasm of other parts of uterus: Secondary | ICD-10-CM | POA: Diagnosis not present

## 2017-03-30 MED ORDER — LIDOCAINE-EPINEPHRINE (PF) 2 %-1:200000 IJ SOLN
20.0000 mL | Freq: Once | INTRAMUSCULAR | Status: AC
  Administered 2017-03-30: 20 mL via INTRADERMAL
  Filled 2017-03-30: qty 20

## 2017-03-30 NOTE — ED Notes (Signed)
Lidocaine at bedside.

## 2017-03-30 NOTE — ED Triage Notes (Signed)
Patient BIB [facility] from [facility] for fall. Pt found in floor, unknown time of fall or how long pt was in floor. Pt c/o lower back pain with laceration to back of head. C-collar placed on pt by EMS. Back is tender to touch but no deformity noted. Pt is not on any bld thinners, just moved into [facility] yesterday. Pt a/o x4, however pt did have one episode of forgetting her last name.

## 2017-03-30 NOTE — ED Provider Notes (Signed)
[facility] DEPT Provider Note   CSN: [id number] Arrival date & time: 03/30/17  0017     History   Chief Complaint Chief Complaint  Patient presents with  . Fall    HPI Lori [name] is a 83 y.o. female.  83 yo F with a chief complaint of a fall out of bed.  The patient is unsure exactly how she fell out of bed.  She is not sure what happened.  She knows that she woke up on the floor.  Complaining of bleeding and pain to the right side of her head.  Denies any other injury.  Denies neck pain.  Denies back pain chest pain abdominal pain.   The history is provided by the patient.  Fall  This is a new problem. The current episode started 1 to 2 hours ago. The problem occurs rarely. The problem has been resolved. Pertinent negatives include no chest pain, no headaches and no shortness of breath. Nothing aggravates the symptoms. Nothing relieves the symptoms. She has tried nothing for the symptoms. The treatment provided no relief.    Past Medical History:  Diagnosis Date  . Colon cancer ([place]) 2009   s/p colon resection  . Endometrial cancer ([place]) 1989   endometrial s/p hysterectomy, chemo and radiation  . Hearing loss   . History of blood transfusion   . Hypertension   . Macular degeneration   . Migraines   . Pneumonia   . Positive TB test   . Stroke [facility]) 2004   ? TIA, brief episode of speech issues - evaluate OH    Patient Active Problem List   Diagnosis Date Noted  . Acute on chronic diastolic heart failure ([place]) 03/19/2017  . Hypertension 03/19/2017  . Hypothyroidism, adult 03/19/2017  . Acute respiratory insufficiency 03/19/2017  . Moderate to severe mitral regurgitation 02/19/2017  . Community acquired pneumonia/Rt LL 02/03/2017  . CHF (congestive heart failure)/Unspecified 02/03/2017  . Vertebral fracture, osteoporotic/T5 02/03/2017  . Sternal fracture 02/03/2017  . Hyperglycemia 10/10/2016  . Back pain 10/09/2016  . Change  in bowel habits 07/24/2013  . History of colon cancer 07/24/2013  . History of endometrial cancer 07/24/2013  . Loss of weight 07/24/2013  . Cystitis 03/16/2013  . Essential hypertension, benign 11/24/2012  . Hx of TIA (transient ischemic attack)  11/24/2012  . Hypothyroidism 11/24/2012    Past Surgical History:  Procedure Laterality Date  . ABDOMINAL HYSTERECTOMY  1989  . APPENDECTOMY    . COLON RESECTION  08/27/2007  . GALLBLADDER SURGERY  1970  . SUBDURAL HEMATOMA EVACUATION VIA CRANIOTOMY  2002  . TONSILLECTOMY AND ADENOIDECTOMY  1943    OB History    No data available       Home Medications    Prior to Admission medications   Medication Sig Start Date End Date Taking? Authorizing Provider  acetaminophen (TYLENOL) 500 MG tablet Take 500 mg every 6 (six) hours as needed by mouth for mild pain.     [provider]  carvedilol (COREG) 3.125 MG tablet Take 1 tablet (3.125 mg total) daily by mouth. Hold for SBP less than 110 03/21/17   Regalado, Belkys A, MD  Cholecalciferol 1000 units capsule Take 1,000 Units daily by mouth.    [provider]  furosemide (LASIX) 20 MG tablet Take 1 tablet (20 mg total) daily by mouth. If your weight increase by 2 pound in 24 hours, take an extra dose of lasix. 03/21/17   Regalado, [employer]  A, MD  levothyroxine (SYNTHROID, LEVOTHROID) 50 MCG tablet TAKE 2 TABLETS ON MONDAY AND FRIDAY. TAKE 1 TABLET ALL OTHER DAYS 11/26/16   Lucretia Kern, DO  Multiple Vitamins-Minerals (EYE VITAMINS PO) Take 1 tablet daily by mouth.     [provider]  potassium chloride SA (K-DUR,KLOR-CON) 20 MEQ tablet Take 1 tablet (20 mEq total) daily by mouth. 03/21/17   Regalado, Cassie Freer, MD    Family History Family History  Problem Relation Age of Onset  . Arthritis Mother   . Heart disease Father   . Hyperlipidemia Father   . Hypertension Father   . Diabetes Sister   . Crohn's disease Daughter     Social History Social History    Tobacco Use  . Smoking status: Never Smoker  . Smokeless tobacco: Never Used  Substance Use Topics  . Alcohol use: Yes    Comment: occ glass of wine- one every 2 months   . Drug use: No     Allergies   Bactrim [sulfamethoxazole-trimethoprim]; Clindamycin/lincomycin; Macrobid [nitrofurantoin macrocrystal]; Penicillins; Sulfa antibiotics; and Zofran [ondansetron hcl]   Review of Systems Review of Systems  Constitutional: Negative for chills and fever.  HENT: Negative for congestion and rhinorrhea.   Eyes: Negative for redness and visual disturbance.  Respiratory: Negative for shortness of breath and wheezing.   Cardiovascular: Negative for chest pain and palpitations.  Gastrointestinal: Negative for nausea and vomiting.  Genitourinary: Negative for dysuria and urgency.  Musculoskeletal: Negative for arthralgias and myalgias.  Skin: Negative for pallor and wound.  Neurological: Negative for dizziness and headaches.     Physical Exam Updated Vital Signs BP 130/65 (BP Location: Left Arm)   Pulse 97   Temp 97.6 F (36.4 C) (Oral)   Resp 16   SpO2 97%   Physical Exam  Constitutional: She is oriented to person, place, and time. She appears well-developed and well-nourished. No distress.  HENT:  Head: Normocephalic.  Right occipital 0.5 cm laceration with surrounding hematoma.  Eyes: EOM are normal. Pupils are equal, round, and reactive to light.  Neck: Normal range of motion. Neck supple.  Cardiovascular: Normal rate and regular rhythm. Exam reveals no gallop and no friction rub.  No murmur heard. Pulmonary/Chest: Effort normal. She has no wheezes. She has no rales.  Abdominal: Soft. She exhibits no distension and no mass. There is no tenderness. There is no guarding.  Musculoskeletal: She exhibits no edema or tenderness.  No midline spinal tenderness no chest wall tenderness no upper or lower extremity  tenderness  Neurological: She is alert and oriented to person,  place, and time.  Skin: Skin is warm and dry. She is not diaphoretic.  Psychiatric: She has a normal mood and affect. Her behavior is normal.  Nursing note and vitals reviewed.    ED Treatments / Results  Labs (all labs ordered are listed, but only abnormal results are displayed) Labs Reviewed - No data to display  EKG  EKG Interpretation None       Radiology Ct Head Wo Contrast  Result Date: 03/30/2017 CLINICAL DATA:  83 year old female with head trauma. EXAM: CT HEAD WITHOUT CONTRAST TECHNIQUE: Contiguous axial images were obtained from the base of the skull through the vertex without intravenous contrast. COMPARISON:  None. FINDINGS: Brain: There is moderate age-related atrophy and chronic microvascular ischemic changes. Areas of old infarct and encephalomalacia noted involving the left frontal lobe and right occipital lobe. Smaller areas of old infarct seen in the cerebellar hemispheres. There is  a 1.4 x 0.8 cm low attenuating ovoid density along the inner table of the left frontal calvarium (series 2, image 19 and coronal series 5, image 24) most consistent with a non calcified meningioma. There is no acute intracranial hemorrhage. No mass effect or midline shift. No extra-axial fluid collection. Vascular: No hyperdense vessel or unexpected calcification. Skull: Bilateral parietal burr holes noted. No acute calvarial pathology. Sinuses/Orbits: No acute finding. Other: Right posterior parietal scalp hematoma. IMPRESSION: 1. No acute intracranial hemorrhage. 2. Low attenuating density along the left frontal calvarium most consistent with noncalcified meningioma. 3. Moderate age-related atrophy and chronic microvascular ischemic changes as well as old infarcts. Electronically Signed   By: Anner Crete M.D.   On: 03/30/2017 01:38    Procedures .Marland KitchenLaceration Repair Date/Time: 03/30/2017 4:17 AM Performed by: Deno Etienne, DO Authorized by: Deno Etienne, DO   Consent:    Consent  obtained:  Verbal   Consent given by:  Patient   Risks discussed:  Infection, pain and poor cosmetic result   Alternatives discussed:  No treatment, delayed treatment and observation Anesthesia (see MAR for exact dosages):    Anesthesia method:  None Laceration details:    Location:  Scalp   Scalp location:  Occipital   Length (cm):  1.5 Repair type:    Repair type:  Simple Treatment:    Wound cleansed with: Chlorhexidine.   Amount of cleaning:  Standard   Irrigation solution:  Sterile saline   Irrigation volume:  30   Irrigation method:  Syringe   Visualized foreign bodies/material removed: no   Skin repair:    Repair method:  Staples   Number of staples:  1 Approximation:    Approximation:  Close Post-procedure details:    Dressing:  Open (no dressing)   Patient tolerance of procedure:  Tolerated well, no immediate complications   (including critical care time)  Medications Ordered in ED Medications  lidocaine-EPINEPHrine (XYLOCAINE W/EPI) 2 %-1:200000 (PF) injection 20 mL (20 mLs Intradermal Given 03/30/17 0056)     Initial Impression / Assessment and Plan / ED Course  I have reviewed the triage vital signs and the nursing notes.  Pertinent labs & imaging results that were available during my care of the patient were reviewed by me and considered in my medical decision making (see chart for details).     83 yo F with a cc of a fall out of bed.  She has a small laceration to the right occiput that I will repair with staples.  CT of the head.  CT is negative.  Wound repaired at bedside.  Discharge home.  4:18 AM:  I have discussed the diagnosis/risks/treatment options with the patient and family and believe the pt to be eligible for discharge home to follow-up with PCP. We also discussed returning to the ED immediately if new or worsening sx occur. We discussed the sx which are most concerning (e.g., sudden worsening pain, fever, inability to tolerate by mouth) that  necessitate immediate return. Medications administered to the patient during their visit and any new prescriptions provided to the patient are listed below.  Medications given during this visit Medications  lidocaine-EPINEPHrine (XYLOCAINE W/EPI) 2 %-1:200000 (PF) injection 20 mL (20 mLs Intradermal Given 03/30/17 0056)     The patient appears reasonably screen and/or stabilized for discharge and I doubt any other medical condition or other [facility] requiring further screening, evaluation, or treatment in the ED at this time prior to discharge.    Final Clinical Impressions(s) /  ED Diagnoses   Final diagnoses:  Fall, initial encounter  Scalp laceration, initial encounter    ED Discharge Orders    None       Deno Etienne, DO 03/30/17 [phone]

## 2017-03-30 NOTE — ED Notes (Signed)
[facility] called for transportation  

## 2017-03-30 NOTE — ED Notes (Signed)
Bed: TJ95 Expected date:  Expected time:  Means of arrival:  Comments: EMS 83 yo female/fall-head lac/back pain 143/94 HR 96 no blood thinners

## 2017-03-30 NOTE — Discharge Instructions (Signed)
Follow up with your PCP or here for staple removal in 5 days. Return for redness, fever, drainage.

## 2017-04-01 ENCOUNTER — Ambulatory Visit: Payer: Medicare Other

## 2017-04-01 ENCOUNTER — Inpatient Hospital Stay: Payer: Medicare Other | Admitting: Family Medicine

## 2017-04-01 ENCOUNTER — Other Ambulatory Visit: Payer: Self-pay

## 2017-04-01 ENCOUNTER — Encounter: Payer: Self-pay | Admitting: Cardiology

## 2017-04-01 ENCOUNTER — Telehealth: Payer: Self-pay | Admitting: Family Medicine

## 2017-04-01 ENCOUNTER — Ambulatory Visit (INDEPENDENT_AMBULATORY_CARE_PROVIDER_SITE_OTHER): Payer: Medicare Other | Admitting: Cardiology

## 2017-04-01 VITALS — BP 137/91 | HR 93 | Ht 59.0 in | Wt 92.8 lb

## 2017-04-01 DIAGNOSIS — I340 Nonrheumatic mitral (valve) insufficiency: Secondary | ICD-10-CM

## 2017-04-01 DIAGNOSIS — Z79899 Other long term (current) drug therapy: Secondary | ICD-10-CM | POA: Diagnosis not present

## 2017-04-01 DIAGNOSIS — I5033 Acute on chronic diastolic (congestive) heart failure: Secondary | ICD-10-CM

## 2017-04-01 DIAGNOSIS — Z8679 Personal history of other diseases of the circulatory system: Secondary | ICD-10-CM

## 2017-04-01 NOTE — Assessment & Plan Note (Signed)
Just discharged 03/20/17 after an admission for CHF- BNP was 656, CXR c/w CHF

## 2017-04-01 NOTE — Patient Outreach (Signed)
[facility]) Care Management  04/01/2017  Lori [name] 1918-12-29 [id number]   Subjective: my mom moved to Assisted Living on Thursday. referring to recent fall, daughter reports, "She is fine".  Objective: none  Assessment: 83 year old with recent admission to hospital for pneumonia/heart failure. History of HTN, TIA, hypothyroidism, colon cancer.Client with observation stay (11/6-11/7) for acute on chronic heart failure. Client transition to Assisted Living[facility] this past Thursday, November 15.  Recent fall-Daughter, [name], reports client was at the Assisted living facility and she thinks client was putting on slippers and somehow fell onto the floor. However she reports client has a suture to her head, but states no additional problems noted since the fall.  Per daughter, client is adjusting and has started to meet new people. [name] reports she has been to see client everyday and that client has a follow up appointment with cardiologist today.   [name] reports plans to continue therapy started while client was in her home and states client will have Occupational Therapy to assist with a low vision care plan. Client to continue to see her current primary care physician.   RNCM discussed case closure-client has other care management services. Client's daughter is in agreement.  Plan: close case.  [name], RN, MSN, [place] Coordinator Cell: [phone]

## 2017-04-01 NOTE — Telephone Encounter (Signed)
I called the nurse Joelene Millin and left a detailed message stating a new FL-2 was left at the front desk for the pts daughter to pick up and per Dr [name] also gave a verbal order for PT due to the pts fall.

## 2017-04-01 NOTE — Assessment & Plan Note (Signed)
EF 50% with moderate to severe MR, EF 50%, severe LAE

## 2017-04-01 NOTE — Telephone Encounter (Signed)
ok 

## 2017-04-01 NOTE — Patient Instructions (Signed)
Medication Instructions:  NO CHANGES  If you need a refill on your cardiac medications before your next appointment, please call your pharmacy.  Labwork: BMET TODAY HERE IN OUR OFFICE AT [facility]   Follow-Up: Your physician wants you to follow-up in: AS SCHEDULED WITH DR [facility].   Thank you for choosing [facility]!!

## 2017-04-01 NOTE — Progress Notes (Signed)
04/01/2017 Lori [name]   [birth year]  [id number]  Primary Physician Lucretia Kern, DO Primary Cardiologist: Hoch[place]  HPI:  Frail 83 y/o female with a history of recent admission for CHF. Pt was taken off Lisinopril and placed on Coreg. Low B/P limited medication titration. She had been on PRN Lasix but this has been changed to daily Lasix with an extra PRN. She is now at [facility]. She is here with her daughter. The pt has been doing well, no orthopnea or edema. She has chronic DOE.   Current Outpatient Medications  Medication Sig Dispense Refill  . acetaminophen (TYLENOL) 500 MG tablet Take 500 mg every 6 (six) hours as needed by mouth for mild pain.     . carvedilol (COREG) 3.125 MG tablet Take 1 tablet (3.125 mg total) daily by mouth. Hold for SBP less than 110 30 tablet 0  . Cholecalciferol 1000 units capsule Take 1,000 Units daily by mouth.    . furosemide (LASIX) 20 MG tablet Take 1 tablet (20 mg total) daily by mouth. If your weight increase by 2 pound in 24 hours, take an extra dose of lasix. 60 tablet 1  . levothyroxine (SYNTHROID, LEVOTHROID) 50 MCG tablet TAKE 2 TABLETS ON MONDAY AND FRIDAY. TAKE 1 TABLET ALL OTHER DAYS 144 tablet 1  . Multiple Vitamins-Minerals (EYE VITAMINS PO) Take 1 tablet daily by mouth.     . potassium chloride SA (K-DUR,KLOR-CON) 20 MEQ tablet Take 1 tablet (20 mEq total) daily by mouth. 30 tablet 0   No current facility-administered medications for this visit.     Allergies  Allergen Reactions  . Bactrim [Sulfamethoxazole-Trimethoprim] Itching    Blisters   . Clindamycin/Lincomycin Other (See Comments)    GI upset  . Macrobid [Nitrofurantoin Macrocrystal] Itching  . Penicillins Itching    Has patient had a PCN reaction causing immediate rash, facial/tongue/throat swelling, SOB or lightheadedness with hypotension Unknown Has patient had a PCN reaction causing severe rash involving mucus membranes or skin necrosis:  Unknown Has patient had a PCN reaction that required hospitalization: Unknown Has patient had a PCN reaction occurring within the last 10 years:Unknown If all of the above answers are "NO", then may proceed with Cephalosporin use.   . Sulfa Antibiotics Itching  . Zofran [Ondansetron Hcl] Hives    Past Medical History:  Diagnosis Date  . Colon cancer ([place]) 2009   s/p colon resection  . Endometrial cancer ([place]) 1989   endometrial s/p hysterectomy, chemo and radiation  . Hearing loss   . History of blood transfusion   . Hypertension   . Macular degeneration   . Migraines   . Pneumonia   . Positive TB test   . Stroke [facility]) 2004   ? TIA, brief episode of speech issues - evaluate [place]    Social History   Socioeconomic History  . Marital status: Widowed    Spouse name: Not on file  . Number of children: 2  . Years of education: Not on file  . Highest education level: Not on file  Social Needs  . Financial resource strain: Not on file  . Food insecurity - worry: Not on file  . Food insecurity - inability: Not on file  . Transportation needs - medical: Not on file  . Transportation needs - non-medical: Not on file  Occupational History  . Occupation: retired  Tobacco Use  . Smoking status: Never Smoker  . Smokeless tobacco: Never Used  Substance  and Sexual Activity  . Alcohol use: Yes    Comment: occ glass of wine- one every 2 months   . Drug use: No  . Sexual activity: Not on file  Other Topics Concern  . 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 Arthritis Mother   . Heart disease Father   . Hyperlipidemia Father   . Hypertension Father   . Diabetes Sister   . Crohn's disease Daughter      Review of Systems: General: negative for chills,  fever, night sweats or weight changes.  Cardiovascular: negative for chest pain, dyspnea on exertion, edema, orthopnea, palpitations, paroxysmal nocturnal dyspnea or shortness of breath Dermatological: negative for rash Respiratory: negative for cough or wheezing Urologic: negative for hematuria Abdominal: negative for nausea, vomiting, diarrhea, bright red blood per rectum, melena, or hematemesis Neurologic: negative for visual changes, syncope, or dizziness All other systems reviewed and are otherwise negative except as noted above.    Blood pressure (!) 137/91, pulse 93, height 4\' 11"  (1.499 m), weight 92 lb 12.8 oz (42.1 kg).  General appearance: alert, cooperative, appears stated age, cachectic and no distress Lungs: kyphosis, clear lungs Heart: regular rate and rhythm and 2/6 MR murmur Extremities: no edema Skin: plae, cool, dry Neurologic: Grossly normal   ASSESSMENT AND PLAN:   Acute on chronic diastolic heart failure ([place]) Just discharged 03/20/17 after an admission for CHF- BNP was 656, CXR c/w CHF  Moderate to severe mitral regurgitation EF 50% with moderate to severe MR, EF 50%, severe LAE  History of PAT (paroxysmal atrial tachycardia) .  PLAN  Same Rx. We discussed a low sodium diet. Consider addition of low dose ACE in the future but she is so frail. It's rematrkable she hasn't gone into atrial fibrillation. I fear she won't tolerate that well when it happens.  She has a f/u with Dr Percival Spanish in Jan. Check BMP today.  Kerin Ransom PA-C 04/01/2017 4:20 PM

## 2017-04-01 NOTE — Telephone Encounter (Signed)
rec'd phone call from RN at [facility], Assisted Living.  Reported she recently rec'd. an updated FL2, that was marked for "SNIF".  Questioned if Dr. [name] actually has recommended SNIF, or if it was marked in error?  Noted that recent office note, indicated pt. was moving to [facility], AL[facility].    Per Joelene Millin, RN, another [facility] will be needed to replace the one that is marked for SNIF.  Is requesting to have "[place]" be marked on new form. Reported that a hard copy of the FL2 is needed, and that the daughter is aware, and can pick up the form, at the office, and deliver to [facility].   Also, is requesting orders be faxed for "in house PT and OT."  Reported that the fax # is [phone].    Will make Dr. [name] aware.

## 2017-04-01 NOTE — Telephone Encounter (Signed)
I called the pts daughter and informed her a new FL-2 form was completed and left at the front desk for her to pick up.  Message sent back to Dr [name] for approval of in house PT/OT order to be faxed.

## 2017-04-01 NOTE — Telephone Encounter (Signed)
Signed. Form to [employer].

## 2017-04-01 NOTE — Telephone Encounter (Signed)
New FL-2 placed on Dr Julianne Rice desk.

## 2017-04-02 ENCOUNTER — Telehealth: Payer: Self-pay | Admitting: Family Medicine

## 2017-04-02 ENCOUNTER — Other Ambulatory Visit: Payer: Self-pay | Admitting: *Deleted

## 2017-04-02 DIAGNOSIS — Z79899 Other long term (current) drug therapy: Secondary | ICD-10-CM

## 2017-04-02 DIAGNOSIS — E875 Hyperkalemia: Secondary | ICD-10-CM

## 2017-04-02 LAB — BASIC METABOLIC PANEL
BUN/Creatinine Ratio: 29 — ABNORMAL HIGH (ref 12–28)
BUN: 22 mg/dL (ref 10–36)
CO2: 26 mmol/L (ref 20–29)
Calcium: 9.4 mg/dL (ref 8.7–10.3)
Chloride: 101 mmol/L (ref 96–106)
Creatinine, Ser: 0.76 mg/dL (ref 0.57–1.00)
GFR calc Af Amer: 77 mL/min/{1.73_m2} (ref >59)
GFR calc non Af Amer: 66 mL/min/{1.73_m2} (ref >59)
Glucose: 97 mg/dL (ref 65–99)
Potassium: 5.7 mmol/L — ABNORMAL HIGH (ref 3.5–5.2)
Sodium: 141 mmol/L (ref 134–144)

## 2017-04-02 NOTE — Telephone Encounter (Signed)
Forms completed and returned to Wendie Simmer.  Please let caller know that requests of this type,orders and forms usually will take 5-7 business days.   Do they have physicians who oversee their facility?  If so, they may want to consider having the patient under the care of one of those physicians.

## 2017-04-02 NOTE — Telephone Encounter (Signed)
Forms completed and faxed and I called Arbie Cookey and informed her of the message below.  She stated she will let the nurse know this as well.

## 2017-04-02 NOTE — Telephone Encounter (Signed)
Form placed in Dr Julianne Rice folder.

## 2017-04-02 NOTE — Telephone Encounter (Signed)
Copied from [place] #[id number]. Topic: Inquiry >> Apr 02, 2017 10:36 AM Scherrie Gerlach wrote: Reason for CRM: carol with [facility] states they faxed an order for several things about an hour ago.  Need to know if we received because they cannot do anything until they get the signed order back. If you have not received, Arbie Cookey would like to know so she can resend, if you can call her back please

## 2017-04-03 ENCOUNTER — Telehealth: Payer: Self-pay | Admitting: Cardiology

## 2017-04-03 NOTE — Telephone Encounter (Signed)
Done, see result note 

## 2017-04-03 NOTE — Telephone Encounter (Signed)
Hoyle Sauer calling from [employer], asks that our office fax an order over in regards to stopping the K+ supplement and the repeat BMP one week.  Fax is [phone] and "ATTN Hoyle Sauer"

## 2017-04-05 ENCOUNTER — Other Ambulatory Visit (HOSPITAL_COMMUNITY): Payer: Medicare Other

## 2017-04-05 ENCOUNTER — Encounter (HOSPITAL_COMMUNITY): Payer: Self-pay | Admitting: Emergency Medicine

## 2017-04-05 ENCOUNTER — Other Ambulatory Visit: Payer: Self-pay

## 2017-04-05 ENCOUNTER — Inpatient Hospital Stay (HOSPITAL_COMMUNITY)
Admission: EM | Admit: 2017-04-05 | Discharge: 2017-04-08 | DRG: 308 | Disposition: A | Discharge status: Nursing Home (Includes ICF) | Payer: Medicare Other | Attending: Internal Medicine | Admitting: Internal Medicine

## 2017-04-05 ENCOUNTER — Emergency Department (HOSPITAL_COMMUNITY): Payer: Medicare Other

## 2017-04-05 DIAGNOSIS — I5040 Unspecified combined systolic (congestive) and diastolic (congestive) heart failure: Secondary | ICD-10-CM

## 2017-04-05 DIAGNOSIS — R778 Other specified abnormalities of plasma proteins: Secondary | ICD-10-CM | POA: Diagnosis present

## 2017-04-05 DIAGNOSIS — Z8542 Personal history of malignant neoplasm of other parts of uterus: Secondary | ICD-10-CM

## 2017-04-05 DIAGNOSIS — I471 Supraventricular tachycardia: Principal | ICD-10-CM | POA: Diagnosis present

## 2017-04-05 DIAGNOSIS — Z90710 Acquired absence of both cervix and uterus: Secondary | ICD-10-CM

## 2017-04-05 DIAGNOSIS — Z8673 Personal history of transient ischemic attack (TIA), and cerebral infarction without residual deficits: Secondary | ICD-10-CM | POA: Diagnosis not present

## 2017-04-05 DIAGNOSIS — I340 Nonrheumatic mitral (valve) insufficiency: Secondary | ICD-10-CM | POA: Diagnosis not present

## 2017-04-05 DIAGNOSIS — Z66 Do not resuscitate: Secondary | ICD-10-CM | POA: Diagnosis present

## 2017-04-05 DIAGNOSIS — Z9221 Personal history of antineoplastic chemotherapy: Secondary | ICD-10-CM | POA: Diagnosis not present

## 2017-04-05 DIAGNOSIS — I509 Heart failure, unspecified: Secondary | ICD-10-CM

## 2017-04-05 DIAGNOSIS — Z85038 Personal history of other malignant neoplasm of large intestine: Secondary | ICD-10-CM

## 2017-04-05 DIAGNOSIS — I110 Hypertensive heart disease with heart failure: Secondary | ICD-10-CM | POA: Diagnosis not present

## 2017-04-05 DIAGNOSIS — H3530 Unspecified macular degeneration: Secondary | ICD-10-CM | POA: Diagnosis present

## 2017-04-05 DIAGNOSIS — E876 Hypokalemia: Secondary | ICD-10-CM | POA: Diagnosis present

## 2017-04-05 DIAGNOSIS — E785 Hyperlipidemia, unspecified: Secondary | ICD-10-CM | POA: Diagnosis present

## 2017-04-05 DIAGNOSIS — R0602 Shortness of breath: Secondary | ICD-10-CM | POA: Diagnosis not present

## 2017-04-05 DIAGNOSIS — E7849 Other hyperlipidemia: Secondary | ICD-10-CM | POA: Diagnosis not present

## 2017-04-05 DIAGNOSIS — R2681 Unsteadiness on feet: Secondary | ICD-10-CM | POA: Diagnosis not present

## 2017-04-05 DIAGNOSIS — I5030 Unspecified diastolic (congestive) heart failure: Secondary | ICD-10-CM | POA: Diagnosis not present

## 2017-04-05 DIAGNOSIS — R41841 Cognitive communication deficit: Secondary | ICD-10-CM | POA: Diagnosis not present

## 2017-04-05 DIAGNOSIS — Z79899 Other long term (current) drug therapy: Secondary | ICD-10-CM

## 2017-04-05 DIAGNOSIS — R0600 Dyspnea, unspecified: Secondary | ICD-10-CM | POA: Diagnosis not present

## 2017-04-05 DIAGNOSIS — M6281 Muscle weakness (generalized): Secondary | ICD-10-CM | POA: Diagnosis not present

## 2017-04-05 DIAGNOSIS — Z9181 History of falling: Secondary | ICD-10-CM

## 2017-04-05 DIAGNOSIS — I5031 Acute diastolic (congestive) heart failure: Secondary | ICD-10-CM | POA: Diagnosis not present

## 2017-04-05 DIAGNOSIS — I5033 Acute on chronic diastolic (congestive) heart failure: Secondary | ICD-10-CM

## 2017-04-05 DIAGNOSIS — E039 Hypothyroidism, unspecified: Secondary | ICD-10-CM | POA: Diagnosis present

## 2017-04-05 DIAGNOSIS — R7989 Other specified abnormal findings of blood chemistry: Secondary | ICD-10-CM

## 2017-04-05 DIAGNOSIS — Z923 Personal history of irradiation: Secondary | ICD-10-CM | POA: Diagnosis not present

## 2017-04-05 DIAGNOSIS — I5020 Unspecified systolic (congestive) heart failure: Secondary | ICD-10-CM | POA: Diagnosis not present

## 2017-04-05 DIAGNOSIS — Z9049 Acquired absence of other specified parts of digestive tract: Secondary | ICD-10-CM | POA: Diagnosis not present

## 2017-04-05 DIAGNOSIS — H9190 Unspecified hearing loss, unspecified ear: Secondary | ICD-10-CM | POA: Diagnosis present

## 2017-04-05 DIAGNOSIS — R000 Tachycardia, unspecified: Secondary | ICD-10-CM | POA: Diagnosis not present

## 2017-04-05 DIAGNOSIS — R748 Abnormal levels of other serum enzymes: Secondary | ICD-10-CM | POA: Diagnosis not present

## 2017-04-05 DIAGNOSIS — R29898 Other symptoms and signs involving the musculoskeletal system: Secondary | ICD-10-CM | POA: Diagnosis not present

## 2017-04-05 LAB — CBC WITH DIFFERENTIAL/PLATELET
Basophils Absolute: 0 10*3/uL (ref 0.0–0.1)
Basophils Relative: 0 %
EOS ABS: 0.2 10*3/uL (ref 0.0–0.7)
Eosinophils Relative: 2 %
HEMATOCRIT: 35.9 % — AB (ref 36.0–46.0)
HEMOGLOBIN: 11.7 g/dL — AB (ref 12.0–15.0)
LYMPHS ABS: 2.1 10*3/uL (ref 0.7–4.0)
LYMPHS PCT: 22 %
MCH: 32.4 pg (ref 26.0–34.0)
MCHC: 32.6 g/dL (ref 30.0–36.0)
MCV: 99.4 fL (ref 78.0–100.0)
MONOS PCT: 7 %
Monocytes Absolute: 0.7 10*3/uL (ref 0.1–1.0)
NEUTROS PCT: 69 %
Neutro Abs: 6.9 10*3/uL (ref 1.7–7.7)
Platelets: 303 10*3/uL (ref 150–400)
RBC: 3.61 MIL/uL — ABNORMAL LOW (ref 3.87–5.11)
RDW: 16.8 % — ABNORMAL HIGH (ref 11.5–15.5)
WBC: 9.9 10*3/uL (ref 4.0–10.5)

## 2017-04-05 LAB — COMPREHENSIVE METABOLIC PANEL
ALT: 51 U/L (ref 14–54)
AST: 62 U/L — ABNORMAL HIGH (ref 15–41)
Albumin: 3 g/dL — ABNORMAL LOW (ref 3.5–5.0)
Alkaline Phosphatase: 112 U/L (ref 38–126)
Anion gap: 10 (ref 5–15)
BILIRUBIN TOTAL: 1 mg/dL (ref 0.3–1.2)
BUN: 14 mg/dL (ref 6–20)
CHLORIDE: 101 mmol/L (ref 101–111)
CO2: 26 mmol/L (ref 22–32)
Calcium: 8.5 mg/dL — ABNORMAL LOW (ref 8.9–10.3)
Creatinine, Ser: 0.66 mg/dL (ref 0.44–1.00)
Glucose, Bld: 103 mg/dL — ABNORMAL HIGH (ref 65–99)
POTASSIUM: 3.4 mmol/L — AB (ref 3.5–5.1)
Sodium: 137 mmol/L (ref 135–145)
TOTAL PROTEIN: 6.6 g/dL (ref 6.5–8.1)

## 2017-04-05 LAB — SURGICAL PCR SCREEN
MRSA, PCR: NEGATIVE
Staphylococcus aureus: NEGATIVE

## 2017-04-05 LAB — I-STAT TROPONIN, ED: Troponin i, poc: 0.06 ng/mL (ref 0.00–0.08)

## 2017-04-05 LAB — TROPONIN I
TROPONIN I: 0.08 ng/mL — AB (ref <0.03)
TROPONIN I: 0.07 ng/mL — AB (ref <0.03)
Troponin I: 0.06 ng/mL (ref <0.03)
Troponin I: 0.08 ng/mL (ref <0.03)

## 2017-04-05 LAB — MAGNESIUM: Magnesium: 2 mg/dL (ref 1.7–2.4)

## 2017-04-05 LAB — I-STAT CHEM 8, ED
BUN: 21 mg/dL — AB (ref 6–20)
CALCIUM ION: 1.09 mmol/L — AB (ref 1.15–1.40)
CHLORIDE: 104 mmol/L (ref 101–111)
CREATININE: 0.7 mg/dL (ref 0.44–1.00)
GLUCOSE: 138 mg/dL — AB (ref 65–99)
HCT: 35 % — ABNORMAL LOW (ref 36.0–46.0)
Hemoglobin: 11.9 g/dL — ABNORMAL LOW (ref 12.0–15.0)
Potassium: 3.9 mmol/L (ref 3.5–5.1)
SODIUM: 140 mmol/L (ref 135–145)
TCO2: 27 mmol/L (ref 22–32)

## 2017-04-05 LAB — GLUCOSE, CAPILLARY: Glucose-Capillary: 100 mg/dL — ABNORMAL HIGH (ref 65–99)

## 2017-04-05 LAB — BRAIN NATRIURETIC PEPTIDE: B Natriuretic Peptide: 870.7 pg/mL — ABNORMAL HIGH (ref 0.0–100.0)

## 2017-04-05 LAB — TSH: TSH: 6.429 u[IU]/mL — AB (ref 0.350–4.500)

## 2017-04-05 MED ORDER — FUROSEMIDE 10 MG/ML IJ SOLN
20.0000 mg | Freq: Every day | INTRAMUSCULAR | Status: DC
  Administered 2017-04-05: 20 mg via INTRAVENOUS
  Filled 2017-04-05: qty 2

## 2017-04-05 MED ORDER — POTASSIUM CHLORIDE CRYS ER 20 MEQ PO TBCR
20.0000 meq | EXTENDED_RELEASE_TABLET | Freq: Once | ORAL | Status: AC
  Administered 2017-04-05: 20 meq via ORAL
  Filled 2017-04-05: qty 1

## 2017-04-05 MED ORDER — CARVEDILOL 3.125 MG PO TABS: 3.1250 mg | ORAL_TABLET | Freq: Every day | ORAL | Status: DC

## 2017-04-05 MED ORDER — METOPROLOL TARTRATE 5 MG/5ML IV SOLN
INTRAVENOUS | Status: AC
  Filled 2017-04-05: qty 5

## 2017-04-05 MED ORDER — POTASSIUM CHLORIDE CRYS ER 20 MEQ PO TBCR
20.0000 meq | EXTENDED_RELEASE_TABLET | Freq: Every day | ORAL | Status: DC
  Administered 2017-04-05 – 2017-04-08 (×4): 20 meq via ORAL
  Filled 2017-04-05 (×4): qty 1

## 2017-04-05 MED ORDER — FUROSEMIDE 10 MG/ML IJ SOLN
20.0000 mg | Freq: Once | INTRAMUSCULAR | Status: AC
  Administered 2017-04-05: 20 mg via INTRAVENOUS
  Filled 2017-04-05: qty 2

## 2017-04-05 MED ORDER — SODIUM CHLORIDE 0.9 % IV SOLN: 250.0000 mL | INTRAVENOUS | Status: DC | PRN

## 2017-04-05 MED ORDER — RIBOFLAVIN 100 MG PO TABS
100.0000 mg | ORAL_TABLET | Freq: Every day | ORAL | Status: DC
  Administered 2017-04-05 – 2017-04-08 (×4): 100 mg via ORAL
  Filled 2017-04-05 (×4): qty 1

## 2017-04-05 MED ORDER — SODIUM CHLORIDE 0.9% FLUSH: 3.0000 mL | INTRAVENOUS | Status: DC | PRN

## 2017-04-05 MED ORDER — ACETAMINOPHEN 650 MG RE SUPP
650.0000 mg | Freq: Four times a day (QID) | RECTAL | Status: DC | PRN
Start: 2017-04-05 — End: 2017-04-08

## 2017-04-05 MED ORDER — ATORVASTATIN CALCIUM 80 MG PO TABS
80.0000 mg | ORAL_TABLET | Freq: Every day | ORAL | Status: DC
  Administered 2017-04-05: 80 mg via ORAL
  Filled 2017-04-05: qty 1

## 2017-04-05 MED ORDER — ASPIRIN EC 81 MG PO TBEC
81.0000 mg | DELAYED_RELEASE_TABLET | Freq: Every day | ORAL | Status: DC
  Administered 2017-04-05 – 2017-04-08 (×4): 81 mg via ORAL
  Filled 2017-04-05 (×4): qty 1

## 2017-04-05 MED ORDER — ACETAMINOPHEN 325 MG PO TABS
650.0000 mg | ORAL_TABLET | Freq: Four times a day (QID) | ORAL | Status: DC | PRN
  Administered 2017-04-07 (×2): 650 mg via ORAL
  Filled 2017-04-05 (×3): qty 2

## 2017-04-05 MED ORDER — ADENOSINE 6 MG/2ML IV SOLN
INTRAVENOUS | Status: AC
  Administered 2017-04-05: 6 mg
  Filled 2017-04-05: qty 4

## 2017-04-05 MED ORDER — CARVEDILOL 3.125 MG PO TABS
3.1250 mg | ORAL_TABLET | Freq: Every day | ORAL | Status: DC
  Administered 2017-04-05: 3.125 mg via ORAL
  Filled 2017-04-05: qty 1

## 2017-04-05 MED ORDER — SODIUM CHLORIDE 0.9% FLUSH
3.0000 mL | Freq: Two times a day (BID) | INTRAVENOUS | Status: DC
  Administered 2017-04-05 – 2017-04-07 (×6): 3 mL via INTRAVENOUS

## 2017-04-05 MED ORDER — ENOXAPARIN SODIUM 30 MG/0.3ML ~~LOC~~ SOLN
30.0000 mg | SUBCUTANEOUS | Status: DC
  Administered 2017-04-05 – 2017-04-08 (×4): 30 mg via SUBCUTANEOUS
  Filled 2017-04-05 (×4): qty 0.3

## 2017-04-05 MED ORDER — METOPROLOL TARTRATE 25 MG PO TABS
25.0000 mg | ORAL_TABLET | Freq: Three times a day (TID) | ORAL | Status: DC
  Administered 2017-04-05 – 2017-04-07 (×6): 25 mg via ORAL
  Filled 2017-04-05 (×6): qty 1

## 2017-04-05 MED ORDER — LEVOTHYROXINE SODIUM 25 MCG PO TABS
25.0000 ug | ORAL_TABLET | Freq: Every day | ORAL | Status: DC
  Administered 2017-04-05: 25 ug via ORAL
  Filled 2017-04-05: qty 1

## 2017-04-05 MED ORDER — TRIAMCINOLONE ACETONIDE 0.1 % EX CREA
1.0000 "application " | TOPICAL_CREAM | Freq: Two times a day (BID) | CUTANEOUS | Status: DC | PRN
  Filled 2017-04-05 (×2): qty 15

## 2017-04-05 MED ORDER — AMIODARONE HCL 100 MG PO TABS
100.0000 mg | ORAL_TABLET | Freq: Two times a day (BID) | ORAL | Status: DC
  Administered 2017-04-05 – 2017-04-08 (×7): 100 mg via ORAL
  Filled 2017-04-05 (×7): qty 1

## 2017-04-05 MED ORDER — LORAZEPAM 2 MG/ML IJ SOLN
0.5000 mg | Freq: Once | INTRAMUSCULAR | Status: AC
  Administered 2017-04-05: 0.5 mg via INTRAVENOUS
  Filled 2017-04-05: qty 1

## 2017-04-05 NOTE — ED Notes (Signed)
Report attempted 

## 2017-04-05 NOTE — Plan of Care (Signed)
Dr. Harrington Challenger notified of pt's trying to get up and picking at lines, sitter order, will continue to monitor

## 2017-04-05 NOTE — ED Triage Notes (Signed)
Pt via [facility] complaints of SOB. HR 170 when EMS arrived. Pt in SVT and given 6 of adenosine and converted. Pt. Reports abdominal and back pain. HX of dementia.

## 2017-04-05 NOTE — Progress Notes (Addendum)
Progress Note  Patient Name: Lori [name] Date of Encounter: 04/05/2017  Primary Cardiologist: Hochrein  Subjective   Breathing better  No CP   Inpatient Medications    Scheduled Meds: . aspirin EC  81 mg Oral Daily  . atorvastatin  80 mg Oral q1800  . furosemide  20 mg Intravenous Daily  . metoprolol tartrate       Continuous Infusions:  PRN Meds:    Vital Signs    Vitals:   04/05/17 0645 04/05/17 0700 04/05/17 0715 04/05/17 0730  BP: 114/66 105/90 121/74 123/77  Pulse: 83 81 88 85  Resp: 19 18 (!) 23 (!) 22  SpO2: 99% 100% 98% 99%  Weight:      Height:        Intake/Output Summary (Last 24 hours) at 04/05/2017 0820 Last data filed at 04/05/2017 0017 Gross per 24 hour  Intake -  Output 800 ml  Net -800 ml   Filed Weights   04/05/17 0411  Weight: 90 lb (40.8 kg)    Telemetry    SR and self limited SVT earlier (probable AVNRT)- Personally Reviewed  ECG    Physical Exam   GEN: Very thin No acute distress.   Neck: No JVD Cardiac: RRR, Gr III/vI systolic murmur apex  no rubs, or gallops.  Respiratory: Clear to auscultation bilaterally. GI: Soft, nontender, non-distended  MS: No edema; No deformity. Neuro:  Nonfocal  Psych: Normal affect   Labs    Chemistry Recent Labs  Lab 04/01/17 1622 04/05/17 0334  NA 141 140  K 5.7* 3.9  CL 101 104  CO2 26  --   GLUCOSE 97 138*  BUN 22 21*  CREATININE 0.76 0.70  CALCIUM 9.4  --   GFRNONAA 66  --   GFRAA 77  --      Hematology Recent Labs  Lab 04/05/17 0324 04/05/17 0334  WBC 9.9  --   RBC 3.61*  --   HGB 11.7* 11.9*  HCT 35.9* 35.0*  MCV 99.4  --   MCH 32.4  --   MCHC 32.6  --   RDW 16.8*  --   PLT 303  --     Cardiac Enzymes Recent Labs  Lab 04/05/17 0324  TROPONINI 0.06*    Recent Labs  Lab 04/05/17 0333  TROPIPOC 0.06     BNP Recent Labs  Lab 04/05/17 0327  BNP 870.7*     DDimer No results for input(s): DDIMER in the last 168 hours.   Radiology    Dg  Chest Portable 1 View  Result Date: 04/05/2017 CLINICAL DATA:  Shortness of breath EXAM: PORTABLE CHEST 1 VIEW COMPARISON:  03/19/2017 FINDINGS: Cardiac enlargement with diffuse pulmonary vascular congestion. Diffuse interstitial infiltration throughout the lungs likely due to edema. Small bilateral pleural effusions. Changes are similar to previous study and suggests congestive heart failure. No pneumothorax. Tortuous aorta. Thoracic scoliosis convex towards the left. Degenerative changes in the shoulders. IMPRESSION: Cardiac enlargement with pulmonary vascular congestion and diffuse interstitial edema. Similar appearance to previous study. Electronically Signed   By: Lucienne Capers M.D.   On: 04/05/2017 03:44    Cardiac Studies     Patient Profile       Assessment & Plan    1  Acute diastolc CHF  Most likely due  SVT in settnig of mod/severe MR Imprved with lasix   Goal to keep on SR  Would go back to lasix 20  Mg po daily  2  SVT  Reviewed with EP  Would reocmm amiodarone 100 mg bid  Try to keep in SR  Keep on b blockade as BP allows and follow periodic EKKGs May need to back down on b blocker if bp or HR slow  3  MV regurgitation.  Mod / severe  Not a candidate for repair  Pt is DNR    For questions or updates, please contact [place] Please consult [email or web address] for contact info under Cardiology/STEMI.      Signed, Dorris Carnes, MD  04/05/2017, 8:20 AM

## 2017-04-05 NOTE — ED Provider Notes (Signed)
[facility] EMERGENCY DEPARTMENT Provider Note   CSN: [id number] Arrival date & time: 04/05/17  0309     History   Chief Complaint No chief complaint on file.   HPI Lori [name] is a 83 y.o. female.  Patient presents by EMS with shortness of breath and palpitations.  Presents from nursing home.  She was found to be in rapid narrow complex tachycardia in the 180s.  She was given adenosine and converted to sinus tachycardia with PVCs.  Upon arrival to the ED she converted back into a narrow complex tachycardia in the 180s and her shortness of breath and palpitations increased.  Patient denies chest pain, cough or fever.  Patient has a history of heart failure and atrial tachycardia but no history of atrial fibrillation.  She has a history of mitral regurgitation and is on Lasix as needed.  She denies any leg pain or leg swelling.  No dizziness or lightheadedness.   The history is provided by the patient.    Past Medical History:  Diagnosis Date  . Colon cancer ([place]) 2009   s/p colon resection  . Endometrial cancer ([place]) 1989   endometrial s/p hysterectomy, chemo and radiation  . Hearing loss   . History of blood transfusion   . Hypertension   . Macular degeneration   . Migraines   . Pneumonia   . Positive TB test   . Stroke [facility]) 2004   ? TIA, brief episode of speech issues - evaluate [place]    Patient Active Problem List   Diagnosis Date Noted  . History of PAT (paroxysmal atrial tachycardia) 04/01/2017  . Acute on chronic diastolic heart failure ([place]) 03/19/2017  . Hypertension 03/19/2017  . Hypothyroidism, adult 03/19/2017  . Acute respiratory insufficiency 03/19/2017  . Moderate to severe mitral regurgitation 02/19/2017  . Community acquired pneumonia/Rt LL 02/03/2017  . CHF (congestive heart failure)/Unspecified 02/03/2017  . Vertebral fracture, osteoporotic/T5 02/03/2017  . Sternal fracture 02/03/2017  . Hyperglycemia 10/10/2016  . Back pain  10/09/2016  . Change in bowel habits 07/24/2013  . History of colon cancer 07/24/2013  . History of endometrial cancer 07/24/2013  . Loss of weight 07/24/2013  . Cystitis 03/16/2013  . Essential hypertension, benign 11/24/2012  . Hx of TIA (transient ischemic attack)  11/24/2012  . Hypothyroidism 11/24/2012    Past Surgical History:  Procedure Laterality Date  . ABDOMINAL HYSTERECTOMY  1989  . APPENDECTOMY    . COLON RESECTION  08/27/2007  . GALLBLADDER SURGERY  1970  . SUBDURAL HEMATOMA EVACUATION VIA CRANIOTOMY  2002  . TONSILLECTOMY AND ADENOIDECTOMY  1943    OB History    No data available       Home Medications    Prior to Admission medications   Medication Sig Start Date End Date Taking? Authorizing Provider  acetaminophen (TYLENOL) 500 MG tablet Take 500 mg every 6 (six) hours as needed by mouth for mild pain.     [provider]  carvedilol (COREG) 3.125 MG tablet Take 1 tablet (3.125 mg total) daily by mouth. Hold for SBP less than 110 03/21/17   Regalado, Belkys A, MD  Cholecalciferol 1000 units capsule Take 1,000 Units daily by mouth.    [provider]  furosemide (LASIX) 20 MG tablet Take 1 tablet (20 mg total) daily by mouth. If your weight increase by 2 pound in 24 hours, take an extra dose of lasix. 03/21/17   Regalado, Belkys A, MD  levothyroxine (SYNTHROID, LEVOTHROID) 50  MCG tablet TAKE 2 TABLETS ON MONDAY AND FRIDAY. TAKE 1 TABLET ALL OTHER DAYS 11/26/16   Lucretia Kern, DO  Multiple Vitamins-Minerals (EYE VITAMINS PO) Take 1 tablet daily by mouth.     [provider]    Family History Family History  Problem Relation Age of Onset  . Arthritis Mother   . Heart disease Father   . Hyperlipidemia Father   . Hypertension Father   . Diabetes Sister   . Crohn's disease Daughter     Social History Social History   Tobacco Use  . Smoking status: Never Smoker  . Smokeless tobacco: Never Used  Substance Use Topics  . Alcohol  use: Yes    Comment: occ glass of wine- one every 2 months   . Drug use: No     Allergies   Bactrim [sulfamethoxazole-trimethoprim]; Clindamycin/lincomycin; Macrobid [nitrofurantoin macrocrystal]; Penicillins; Sulfa antibiotics; and Zofran [ondansetron hcl]   Review of Systems Review of Systems  HENT: Negative for congestion and nosebleeds.   Respiratory: Positive for shortness of breath. Negative for cough and chest tightness.   Cardiovascular: Negative for chest pain and leg swelling.  Gastrointestinal: Negative for abdominal pain, nausea and vomiting.  Genitourinary: Negative for dysuria, hematuria, vaginal bleeding and vaginal discharge.  Musculoskeletal: Positive for back pain. Negative for arthralgias and myalgias.  Neurological: Negative for dizziness and headaches.    all other systems are negative except as noted in the HPI and PMH.    Physical Exam Updated Vital Signs BP (!) 132/103   Pulse (!) 55   Resp (!) 25   Ht 4\' 11"  (1.499 m)   Wt 40.8 kg (90 lb)   SpO2 99%   BMI 18.18 kg/m   Physical Exam  Constitutional: She is oriented to person, place, and time. She appears well-developed and well-nourished. She appears distressed.  Moderate respiratory distress, tachypneic with accessory muscle use  HENT:  Head: Normocephalic and atraumatic.  Mouth/Throat: Oropharynx is clear and moist. No oropharyngeal exudate.  Eyes: Conjunctivae and EOM are normal. Pupils are equal, round, and reactive to light.  Neck: Normal range of motion. Neck supple.  No meningismus.  Cardiovascular: Normal rate and intact distal pulses.  Murmur heard. Initial rhythm was regular tachycardia in the 180s.  After adenosine, converted to  irregular tachycardia  Pulmonary/Chest: Effort normal. No respiratory distress. She has rales.  Bibasilar crackles  Abdominal: Soft. There is no tenderness. There is no rebound and no guarding.  Musculoskeletal: Normal range of motion. She exhibits no edema  or tenderness.  Neurological: She is alert and oriented to person, place, and time. No cranial nerve deficit. She exhibits normal muscle tone. Coordination normal.   5/5 strength throughout. CN 2-12 intact.Equal grip strength.   Skin: Skin is warm. Capillary refill takes less than 2 seconds.  Psychiatric: She has a normal mood and affect. Her behavior is normal.  Nursing note and vitals reviewed.    ED Treatments / Results  Labs (all labs ordered are listed, but only abnormal results are displayed) Labs Reviewed  CBC WITH DIFFERENTIAL/PLATELET - Abnormal; Notable for the following components:      Result Value   RBC 3.61 (*)    Hemoglobin 11.7 (*)    HCT 35.9 (*)    RDW 16.8 (*)    All other components within normal limits  BRAIN NATRIURETIC PEPTIDE - Abnormal; Notable for the following components:   B Natriuretic Peptide 870.7 (*)    All other components within normal limits  I-STAT CHEM 8, ED - Abnormal; Notable for the following components:   BUN 21 (*)    Glucose, Bld 138 (*)    Calcium, Ion 1.09 (*)    Hemoglobin 11.9 (*)    HCT 35.0 (*)    All other components within normal limits  TROPONIN I  I-STAT TROPONIN, ED    EKG  EKG Interpretation  Date/Time:  Friday April 05 2017 03:36:40 EST Ventricular Rate:  107 PR Interval:    QRS Duration: 104 QT Interval:  354 QTC Calculation: 473 R Axis:   -38 Text Interpretation:  Sinus arrhythmia Ventricular premature complex Abnormal R-wave progression, early transition LVH with secondary repolarization abnormality Inferior infarct, old No significant change was found Confirmed by Ezequiel Essex [phone]) on 04/05/2017 4:24:43 AM       Radiology Dg Chest Portable 1 View  Result Date: 04/05/2017 CLINICAL DATA:  Shortness of breath EXAM: PORTABLE CHEST 1 VIEW COMPARISON:  03/19/2017 FINDINGS: Cardiac enlargement with diffuse pulmonary vascular congestion. Diffuse interstitial infiltration throughout the lungs likely due  to edema. Small bilateral pleural effusions. Changes are similar to previous study and suggests congestive heart failure. No pneumothorax. Tortuous aorta. Thoracic scoliosis convex towards the left. Degenerative changes in the shoulders. IMPRESSION: Cardiac enlargement with pulmonary vascular congestion and diffuse interstitial edema. Similar appearance to previous study. Electronically Signed   By: Lucienne Capers M.D.   On: 04/05/2017 03:44    Procedures .Cardioversion Date/Time: 04/05/2017 4:43 AM Performed by: Ezequiel Essex, MD Authorized by: Ezequiel Essex, MD   Consent:    Consent obtained:  Verbal and emergent situation   Consent given by:  Patient   Risks discussed:  Induced arrhythmia   Alternatives discussed:  Delayed treatment and rate-control medication Pre-procedure details:    Cardioversion basis:  Emergent   Rhythm:  Supraventricular tachycardia Patient sedated: No Attempt one:    Cardioversion outcome attempt one: conversion to atrial fibrillation. Post-procedure details:    Patient status:  Awake   Patient tolerance of procedure:  Tolerated well, no immediate complications Comments:     Chemical cardioversion with adenosine     (including critical care time)  Medications Ordered in ED Medications  metoprolol tartrate (LOPRESSOR) 5 MG/5ML injection (not administered)  adenosine (ADENOCARD) 6 MG/2ML injection (6 mg  Given 04/05/17 0334)  furosemide (LASIX) injection 20 mg (20 mg Intravenous Given 04/05/17 0333)  LORazepam (ATIVAN) injection 0.5 mg (0.5 mg Intravenous Given 04/05/17 0422)     Initial Impression / Assessment and Plan / ED Course  I have reviewed the triage vital signs and the nursing notes.  Pertinent labs & imaging results that were available during my care of the patient were reviewed by me and considered in my medical decision making (see chart for details).     Patient with shortness of breath and tachycardia from her assisted  living home.  Initial rhythm per EMS was SVT and she was given adenosine with conversion.  On arrival her rate is rapid narrow complex in the 180s.  Patient given adenosine again with rhythm change to a regular tachycardia and apparent underlying atrial fibrillation versus atrial tachycardia.  Rhythm then became sinus tachycardia with PVCs  Continues to maintain sinus rhythm.  She is given IV Lasix and placed on BiPAP which she was not able to tolerate very well.  Patient improving with diuresis.  Her labs are reassuring with stable creatinine and mildly elevated troponin.  D/w cardiology Dr. Massie Bougie who has seen patient and agrees with IV diuresis  and medical admission for titration of rate control medications.  Discussed With Dr. [name].  CRITICAL CARE Performed by: Ezequiel Essex Total critical care time: 40 minutes Critical care time was exclusive of separately billable procedures and treating other patients. Critical care was necessary to treat or prevent imminent or life-threatening deterioration. Critical care was time spent personally by me on the following activities: development of treatment plan with patient and/or surrogate as well as nursing, discussions with consultants, evaluation of patient's response to treatment, examination of patient, obtaining history from patient or surrogate, ordering and performing treatments and interventions, ordering and review of laboratory studies, ordering and review of radiographic studies, pulse oximetry and re-evaluation of patient's condition.    Final Clinical Impressions(s) / ED Diagnoses   Final diagnoses:  SVT (supraventricular tachycardia) ([place])  Acute on chronic diastolic congestive heart failure [facility])    ED Discharge Orders    None       Ezequiel Essex, MD 04/05/17 [phone]

## 2017-04-05 NOTE — Progress Notes (Signed)
PROGRESS NOTE    Lori [name]  JXB:[id number] DOB: 1918-09-07 DOA: 04/05/2017 PCP: Lucretia Kern, DO    Brief Narrative:  Lori [name]  is a 83 y.o. female, w hypertension, cva,  w mod-severe MR, Diastolic HF, apparently c/o increase in dyspnea starting at about 2 am.  + palpitations and chest tightness.  she was found to be in SVT, HR in the 170. She received adenosine and converted to sinus. She was having dyspnea, was place on BIPAP transiently.  Patient has been admitted for SVt and HF exacerbation. Cardiology has been consulted.    Assessment & Plan:   Principal Problem:   SVT (supraventricular tachycardia) (HCC) Active Problems:   CHF (congestive heart failure)/Unspecified   Elevated troponin  1-SVT;  Mild elevation of troponin.  She is on coreg daily, this medication was started last admission.  Replace potasium.  Cardiology consulted. Follow recommendation.  Received adenosine.   2-Acute diastolic HF exacerbation. In setting of SVT.  IV lasix.  Cardiology consulted.   3-Hypothyroidism; continue with Synthroid. TSH elevated. Check free T 3 and T 4.   4-Mild elevation troponin; in setting SVT  5-hypokalemia; replaced   DVT prophylaxis: Lovenox Code Status: DNR Family Communication: no family at bedside.  Disposition Plan:  Home when stable.   Consultants:   Cardiology    Procedures:   none   Antimicrobials:   none   Subjective: She is breathing better. She is confuse, think she is in a baseman.    Objective: Vitals:   04/05/17 0645 04/05/17 0700 04/05/17 0715 04/05/17 0730  BP: 114/66 105/90 121/74 123/77  Pulse: 83 81 88 85  Resp: 19 18 (!) 23 (!) 22  SpO2: 99% 100% 98% 99%  Weight:      Height:        Intake/Output Summary (Last 24 hours) at 04/05/2017 0952 Last data filed at 04/05/2017 0739 Gross per 24 hour  Intake -  Output 800 ml  Net -800 ml   Filed Weights   04/05/17 0411  Weight: 40.8 kg (90 lb)     Examination:  General exam: Appears calm and comfortable  Respiratory system: Clear to auscultation. Respiratory effort normal. Cardiovascular system: S1 & S2 heard, RRR. No JVD, murmurs, rubs, gallops or clicks. No pedal edema. Gastrointestinal system: Abdomen is nondistended, soft and nontender. No organomegaly or masses felt. Normal bowel sounds heard. Central nervous system: Alert and oriented. No focal neurological deficits. Extremities: Symmetric 5 x 5 power. Skin: No rashes, lesions or ulcers Psychiatry: anxious.    Data Reviewed: I have personally reviewed following labs and imaging studies  CBC: Recent Labs  Lab 04/05/17 0324 04/05/17 0334  WBC 9.9  --   NEUTROABS 6.9  --   HGB 11.7* 11.9*  HCT 35.9* 35.0*  MCV 99.4  --   PLT 303  --    Basic Metabolic Panel: Recent Labs  Lab 04/01/17 1622 04/05/17 0324 04/05/17 0334  NA 141  --  140  K 5.7*  --  3.9  CL 101  --  104  CO2 26  --   --   GLUCOSE 97  --  138*  BUN 22  --  21*  CREATININE 0.76  --  0.70  CALCIUM 9.4  --   --   MG  --  2.0  --    GFR: Estimated Creatinine Clearance: 26.5 mL/min (by C-G formula based on SCr of 0.7 mg/dL). Liver Function Tests: No results for input(s): AST, ALT,  ALKPHOS, BILITOT, PROT, ALBUMIN in the last 168 hours. No results for input(s): LIPASE, AMYLASE in the last 168 hours. No results for input(s): AMMONIA in the last 168 hours. Coagulation Profile: No results for input(s): INR, PROTIME in the last 168 hours. Cardiac Enzymes: Recent Labs  Lab 04/05/17 0324  TROPONINI 0.06*   BNP (last 3 results) No results for input(s): PROBNP in the last 8760 hours. HbA1C: No results for input(s): HGBA1C in the last 72 hours. CBG: No results for input(s): GLUCAP in the last 168 hours. Lipid Profile: No results for input(s): CHOL, HDL, LDLCALC, TRIG, CHOLHDL, LDLDIRECT in the last 72 hours. Thyroid Function Tests: No results for input(s): TSH, T4TOTAL, FREET4, T3FREE,  THYROIDAB in the last 72 hours. Anemia Panel: No results for input(s): VITAMINB12, FOLATE, FERRITIN, TIBC, IRON, RETICCTPCT in the last 72 hours. Sepsis Labs: No results for input(s): PROCALCITON, LATICACIDVEN in the last 168 hours.  No results found for this or any previous visit (from the past 240 hour(s)).       Radiology Studies: Dg Chest Portable 1 View  Result Date: 04/05/2017 CLINICAL DATA:  Shortness of breath EXAM: PORTABLE CHEST 1 VIEW COMPARISON:  03/19/2017 FINDINGS: Cardiac enlargement with diffuse pulmonary vascular congestion. Diffuse interstitial infiltration throughout the lungs likely due to edema. Small bilateral pleural effusions. Changes are similar to previous study and suggests congestive heart failure. No pneumothorax. Tortuous aorta. Thoracic scoliosis convex towards the left. Degenerative changes in the shoulders. IMPRESSION: Cardiac enlargement with pulmonary vascular congestion and diffuse interstitial edema. Similar appearance to previous study. Electronically Signed   By: Lucienne Capers M.D.   On: 04/05/2017 03:44        Scheduled Meds: . aspirin EC  81 mg Oral Daily  . atorvastatin  80 mg Oral q1800  . carvedilol  3.125 mg Oral Q breakfast  . enoxaparin (LOVENOX) injection  30 mg Subcutaneous Q24H  . furosemide  20 mg Intravenous Daily  . levothyroxine  25 mcg Oral QAC breakfast  . metoprolol tartrate      . potassium chloride SA  20 mEq Oral Daily  . Riboflavin  100 mg Oral Daily  . sodium chloride flush  3 mL Intravenous Q12H   Continuous Infusions: . sodium chloride       LOS: 0 days    Time spent: 35 minutes.     Elmarie Shiley, MD [facility] Pager [phone]  If 7PM-7AM, please contact night-coverage [email or web address] Password [facility] 04/05/2017, 9:52 AM

## 2017-04-05 NOTE — Consult Note (Signed)
CARDIOLOGY CONSULT NOTE   Referring Physician: Dr. Ezequiel Essex Primary Physician: Dr. Colin Benton Primary Cardiologist: Dr. Percival Spanish Reason for Consultation: SVT, HFpEF  HPI:  Patient is a very pleasant 83 y/o F who resides in a facility was brought in today by EMS for narrow complex, regular, tachycardia with HR's in the 160-180's with associated SOB and decreased O2 saturations. She is not a great history. Hence the information was obtained from the family member (daugther, [place]) who is at the bed side and the medical char. Patient supposedly hit her call bell at the facility and was found to be minimally responsive and with low O2 levels. Hence EMS was contacted to bring her to the hospital. Patient on EMS arrival was found to be in a narrow complex tachycardia rhythm. Patient converted to sinus rhythm with 6mg  of adenosine, but then converted back into the narrow complex, regular, tachycardia and required a cardioversion to return back into sinus rhythm. This is the first time she had any arrhthymias. She was dx with HFpEF 6 months ago and per family member has been progressively declining clinically. Cardiology was consulted to provide further recommendations.   Of note, patient was d/c from the hospital on 11/7, 2018 after a recent HF exacerbation.  Instead of taking Prn lasix, she is now recommended to take 20mg  of lasix qday. Additionally, her lisinopril was changed to coreg at the time of the last visit.  She does have a history of falls and is relatively inactive due to lower extremity weakness and lack of coordination. She was recently placed in a nursing facility 1 week prior. After which she returned to the hospital with a head laceration after a mechanical fall attempting to get out of bed, which required stapling.  Review of Systems:     Cardiac Review of Systems: {Y] = yes [ ]  = no  Chest Pain [    ]  Resting SOB [ Y  ] Exertional SOB  [  ]  Orthopnea [  ]   Pedal Edema [    ]    Palpitations [  ] Syncope  [  ]   Presyncope [   ]  General Review of Systems: [Y] = yes [  ]=no Constitional: recent weight change [  ]; anorexia [  ]; fatigue [  ]; nausea [  ]; night sweats [  ]; fever [  ]; or chills [  ];                                                                     Eyes : blurred vision [  ]; diplopia [   ]; vision changes [  ];  Amaurosis fugax[  ]; Resp: cough [  ];  wheezing[  ];  hemoptysis[  ];  PND [  ];  GI:  gallstones[  ], vomiting[  ];  dysphagia[  ]; melena[  ];  hematochezia [  ]; heartburn[  ];   GU: kidney stones [  ]; hematuria[  ];   dysuria [  ];  nocturia[  ]; incontinence [  ];             Skin: rash, swelling[  ];, hair loss[  ];  peripheral edema[  ];  or itching[  ]; Musculosketetal: myalgias[  ];  joint swelling[  ];  joint erythema[  ];  joint pain[  ];  back pain[  ];  Heme/Lymph: bruising[  ];  bleeding[  ];  anemia[  ];  Neuro: TIA[  ];  headaches[  ];  stroke[  ];  vertigo[  ];  seizures[  ];   paresthesias[  ];  difficulty walking[  ];  Psych:depression[  ]; Sylvester Harder ];  Endocrine: diabetes[  ];  thyroid dysfunction[  ];  Other:  Past Medical History:  Diagnosis Date  . Colon cancer ([place]) 2009   s/p colon resection  . Endometrial cancer ([place]) 1989   endometrial s/p hysterectomy, chemo and radiation  . Hearing loss   . History of blood transfusion   . Hypertension   . Macular degeneration   . Migraines   . Pneumonia   . Positive TB test   . Stroke [facility]) 2004   ? TIA, brief episode of speech issues - evaluate OH    (Not in a hospital admission)   . metoprolol tartrate       Infusions:  Allergies  Allergen Reactions  . Bactrim [Sulfamethoxazole-Trimethoprim] Itching    Blisters   . Clindamycin/Lincomycin Other (See Comments)    GI upset  . Macrobid [Nitrofurantoin Macrocrystal] Itching  . Penicillins Itching    Has patient had a PCN reaction causing immediate rash, facial/tongue/throat swelling, SOB or  lightheadedness with hypotension Unknown Has patient had a PCN reaction causing severe rash involving mucus membranes or skin necrosis: Unknown Has patient had a PCN reaction that required hospitalization: Unknown Has patient had a PCN reaction occurring within the last 10 years:Unknown If all of the above answers are "NO", then may proceed with Cephalosporin use.   . Sulfa Antibiotics Itching  . Zofran [Ondansetron Hcl] Hives    Social History   Socioeconomic History  . Marital status: Widowed    Spouse name: Not on file  . Number of children: 2  . Years of education: Not on file  . Highest education level: Not on file  Social Needs  . Financial resource strain: Not on file  . Food insecurity - worry: Not on file  . Food insecurity - inability: Not on file  . Transportation needs - medical: Not on file  . Transportation needs - non-medical: Not on file  Occupational History  . Occupation: retired  Tobacco Use  . Smoking status: Never Smoker  . Smokeless tobacco: Never Used  Substance and Sexual Activity  . Alcohol use: Yes    Comment: occ glass of wine- one every 2 months   . Drug use: No  . Sexual activity: Not on file  Other Topics Concern  . 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 Arthritis Mother   . Heart disease Father   . Hyperlipidemia Father   . Hypertension Father   . Diabetes Sister   . Crohn's disease Daughter    PHYSICAL EXAM: Vitals:   04/05/17 0419 04/05/17 0420  BP:    Pulse: (!) 44 (!) 55  Resp: (!) 24 (!) 25  SpO2: 99% 99%    No intake  or output data in the 24 hours ending 04/05/17 0533  General:  Well appearing. No respiratory difficulty HEENT: normal Neck: supple. + JVD. No lymphadenopathy or thryomegaly  appreciated. Cor: PMI nondisplaced. Irregular rhythm. Holosystolic murmur 3/6 Lungs: diminished at the bases Abdomen: soft, nontender, nondistended. No hepatosplenomegaly. No bruits or masses. Good bowel sounds. Extremities: no cyanosis, clubbing, rash, edema Neuro: alert & oriented to person and place, cranial nerves grossly intact. moves all 4 extremities w/o difficulty. Affect pleasant.  ECG:  Results for orders placed or performed during the hospital encounter of 04/05/17 (from the past 24 hour(s))  CBC with Differential/Platelet     Status: Abnormal   Collection Time: 04/05/17  3:24 AM  Result Value Ref Range   WBC 9.9 4.0 - 10.5 K/uL   RBC 3.61 (L) 3.87 - 5.11 MIL/uL   Hemoglobin 11.7 (L) 12.0 - 15.0 g/dL   HCT 35.9 (L) 36.0 - 46.0 %   MCV 99.4 78.0 - 100.0 fL   MCH 32.4 26.0 - 34.0 pg   MCHC 32.6 30.0 - 36.0 g/dL   RDW 16.8 (H) 11.5 - 15.5 %   Platelets 303 150 - 400 K/uL   Neutrophils Relative % 69 %   Neutro Abs 6.9 1.7 - 7.7 K/uL   Lymphocytes Relative 22 %   Lymphs Abs 2.1 0.7 - 4.0 K/uL   Monocytes Relative 7 %   Monocytes Absolute 0.7 0.1 - 1.0 K/uL   Eosinophils Relative 2 %   Eosinophils Absolute 0.2 0.0 - 0.7 K/uL   Basophils Relative 0 %   Basophils Absolute 0.0 0.0 - 0.1 K/uL  Troponin I     Status: Abnormal   Collection Time: 04/05/17  3:24 AM  Result Value Ref Range   Troponin I 0.06 (HH) <0.03 ng/mL  Brain natriuretic peptide     Status: Abnormal   Collection Time: 04/05/17  3:27 AM  Result Value Ref Range   B Natriuretic Peptide 870.7 (H) 0.0 - 100.0 pg/mL  I-stat troponin, ED     Status: None   Collection Time: 04/05/17  3:33 AM  Result Value Ref Range   Troponin i, poc 0.06 0.00 - 0.08 ng/mL   Comment 3          I-stat chem 8, ed     Status: Abnormal   Collection Time: 04/05/17  3:34 AM  Result Value Ref Range   Sodium 140 135 - 145 mmol/L   Potassium 3.9 3.5 - 5.1 mmol/L   Chloride 104 101 - 111 mmol/L   BUN 21 (H) 6 - 20 mg/dL    Creatinine, Ser 0.70 0.44 - 1.00 mg/dL   Glucose, Bld 138 (H) 65 - 99 mg/dL   Calcium, Ion 1.09 (L) 1.15 - 1.40 mmol/L   TCO2 27 22 - 32 mmol/L   Hemoglobin 11.9 (L) 12.0 - 15.0 g/dL   HCT 35.0 (L) 36.0 - 46.0 %   Dg Chest Portable 1 View  Result Date: 04/05/2017 CLINICAL DATA:  Shortness of breath EXAM: PORTABLE CHEST 1 VIEW COMPARISON:  03/19/2017 FINDINGS: Cardiac enlargement with diffuse pulmonary vascular congestion. Diffuse interstitial infiltration throughout the lungs likely due to edema. Small bilateral pleural effusions. Changes are similar to previous study and suggests congestive heart failure. No pneumothorax. Tortuous aorta. Thoracic scoliosis convex towards the left. Degenerative changes in the shoulders. IMPRESSION: Cardiac enlargement with pulmonary vascular congestion and diffuse interstitial edema. Similar appearance to previous study. Electronically Signed   By: Lucienne Capers M.D.   On:  04/05/2017 03:44   ASSESSMENT:  83 y/o F with HFpEF and moderate to severe MR comes in today with SVT and increased SOB.  Regular, Narrow complex tachycardia, SVT Acute on chronic HFpEF precipitated by the above and below Moderate to severe Mitral regurgitation with a posterior flail leaflet. Carpentier type II. Hx CVA Hx of multiple falls with back fracture and recent head trauma  PLAN/DISCUSSION:  Converted with 6mg  of adenosine initially prior to going back into SVT. She required a cardioversion to be brought back to NSR. The strips showed narrow complex tachycardia with HR's in the 160-180's and regular. There was a concern for afib, but to me it appeared like ectopic atrial tachycardia.   Since this is the first occurrence of tachycardia will consider changing coreg to lopressor 12.5 mg, BID. She will need tight HR and BP control to prevent symptoms from her MR. Goal HR should be around 60-76, barring symptoms.   An additional agent for BP control for goal BP to be < 130 SBP may  be required. She will need tight BP control to prevent worsening of the MR.  Recommended diuresis with 20mg  of IV lasix and monitor the I and O's with daily weight assessment as well. (HF may have been precipitated by the SVT and the underlying valvular pathology)  Patient is also a DNR.  Discussed in detail with the daughter who is the POA and the patient about conservative vs. Procedural approach and both of them requested to attempt the conservative approach first. They are not in favor of ablation or further evaluation for valve to consider intervention, at this time, which may be appropriate.  Cardiology will continue to follow.   Cardiology fellow [facility]

## 2017-04-05 NOTE — ED Notes (Signed)
Date and time results received: 04/05/17 0422 (use smartphrase ".now" to insert current time)  Test: Troponin Critical Value: 0.06  Name of Provider Notified: Rancor, MD  Orders Received? Or Actions Taken?:

## 2017-04-05 NOTE — Procedures (Signed)
Orders for Bipap received.  Attempted to place on patient at very low settings, however patient could not tolerate long enough to get the straps tightened.  MD in room and aware.  Placed back on 2lpm [place] with sats of 98%.

## 2017-04-05 NOTE — H&P (Addendum)
TRH H&P   Patient Demographics:    Lori [name], is a 83 y.o. female  MRN: [id number]   DOB - [birth year]  Admit Date - 04/05/2017  Outpatient Primary MD for the patient is Lucretia Kern, DO  Referring MD/NP/PA: Charolotte Capuchin  Outpatient Specialists:  Almyra Deforest, Minus Breeding  Patient coming from:  [facility]  No chief complaint on file.  dyspnea   HPI:    Lori [name]  is a 83 y.o. female, w hypertension, cva,  w mod-severe MR, apparently c/o increase in dyspnea starting at about 2 am.  + palpitations and chest tightness.    In ED,  ekg SVT at 166 CXRIMPRESSION: Cardiac enlargement with pulmonary vascular congestion and diffuse interstitial edema. Similar appearance to previous study.  Trop 0.06 BNP 870.7 BUn 21, creatinine 0.70 Wbc 9.9, Hgb 11.7, Plt 303  Cardiology consult requested by ED,  No afib per cardiology per ED.  No anticoagulation.  Pt will be admitted for dyspnea secondary to SVT , and CHF and trop elevation  Review of systems:    In addition to the HPI above, No Fever-chills, No Headache, No changes with Vision or hearing, No problems swallowing food or Liquids,  No Abdominal pain, No Nausea or Vommitting, Bowel movements are regular, No Blood in stool or Urine, No dysuria, No new skin rashes or bruises, No new joints pains-aches,  No new weakness, tingling, numbness in any extremity, No recent weight gain or loss, No polyuria, polydypsia or polyphagia, No significant Mental Stressors.  A full 10 point Review of Systems was done, except as stated above, all other Review of Systems were negative.   With Past History of the following :    Past Medical History:  Diagnosis Date  . Colon cancer ([place]) 2009   s/p colon resection  . Endometrial cancer ([place]) 1989   endometrial s/p hysterectomy, chemo and radiation  . Hearing loss     . History of blood transfusion   . Hypertension   . Macular degeneration   . Migraines   . Pneumonia   . Positive TB test   . Stroke [facility]) 2004   ? TIA, brief episode of speech issues - evaluate OH      Past Surgical History:  Procedure Laterality Date  . ABDOMINAL HYSTERECTOMY  1989  . APPENDECTOMY    . COLON RESECTION  08/27/2007  . GALLBLADDER SURGERY  1970  . SUBDURAL HEMATOMA EVACUATION VIA CRANIOTOMY  2002  . TONSILLECTOMY AND ADENOIDECTOMY  1943      Social History:     Social History   Tobacco Use  . Smoking status: Never Smoker  . Smokeless tobacco: Never Used  Substance Use Topics  . Alcohol use: Yes    Comment: occ glass of wine- one every 2 months      Lives -  At [place] -  Walks by self   Family History :     Family History  Problem Relation Age of Onset  . Arthritis Mother   . Heart disease Father   . Hyperlipidemia Father   . Hypertension Father   . Diabetes Sister   . Crohn's disease Daughter       Home Medications:   Prior to Admission medications   Medication Sig Start Date End Date Taking? Authorizing Provider  acetaminophen (TYLENOL) 500 MG tablet Take 500 mg every 6 (six) hours as needed by mouth for mild pain.    Yes [provider]  carvedilol (COREG) 3.125 MG tablet Take 1 tablet (3.125 mg total) daily by mouth. Hold for SBP less than 110 03/21/17  Yes Regalado, Belkys A, MD  Cholecalciferol 1000 units capsule Take 1,000 Units daily by mouth.   Yes [provider]  furosemide (LASIX) 20 MG tablet Take 1 tablet (20 mg total) daily by mouth. If your weight increase by 2 pound in 24 hours, take an extra dose of lasix. 03/21/17  Yes Regalado, Belkys A, MD  furosemide (LASIX) 20 MG tablet Take 20 mg by mouth daily.   Yes [provider]  lactose free nutrition (BOOST) LIQD Take 237 mLs by mouth 2 (two) times daily between meals.   Yes [provider]  levothyroxine (SYNTHROID,  LEVOTHROID) 50 MCG tablet TAKE 2 TABLETS ON MONDAY AND FRIDAY. TAKE 1 TABLET ALL OTHER DAYS 11/26/16  Yes Lucretia Kern, DO  Multiple Vitamins-Minerals (EYE VITAMINS PO) Take 1 tablet daily by mouth.    Yes [provider]  potassium chloride SA (K-DUR,KLOR-CON) 20 MEQ tablet Take 20 mEq by mouth daily.   Yes [provider]  riboflavin (VITAMIN B-2) 100 MG TABS tablet Take 100 mg by mouth daily.   Yes [provider]  triamcinolone cream (KENALOG) 0.1 % Apply 1 application topically 2 (two) times daily as needed (rash).   Yes [provider]     Allergies:     Allergies  Allergen Reactions  . Bactrim [Sulfamethoxazole-Trimethoprim] Itching    Blisters   . Clindamycin/Lincomycin Other (See Comments)    GI upset  . Macrobid [Nitrofurantoin Macrocrystal] Itching  . Penicillins Itching    Has patient had a PCN reaction causing immediate rash, facial/tongue/throat swelling, SOB or lightheadedness with hypotension Unknown Has patient had a PCN reaction causing severe rash involving mucus membranes or skin necrosis: Unknown Has patient had a PCN reaction that required hospitalization: Unknown Has patient had a PCN reaction occurring within the last 10 years:Unknown If all of the above answers are "NO", then may proceed with Cephalosporin use.   . Sulfa Antibiotics Itching  . Zofran [Ondansetron Hcl] Hives     Physical Exam:   Vitals  Blood pressure (!) 132/103, pulse (!) 55, resp. rate (!) 25, height 4\' 11"  (1.499 m), weight 40.8 kg (90 lb), SpO2 99 %.   1. General lying in bed in NAD,    2. Normal affect and insight, Not Suicidal or Homicidal, Awake Alert, Oriented X 3.  3. No F.N deficits, ALL C.Nerves Intact, Strength 5/5 all 4 extremities, Sensation intact all 4 extremities, Plantars down going.  4. Ears and Eyes appear Normal, Conjunctivae clear, PERRLA. Moist Oral Mucosa.  5. Supple Neck, No JVD, No cervical lymphadenopathy appriciated,  No Carotid Bruits.  6. Symmetrical Chest wall movement, Good air movement bilaterally, CTAB.  7. RRR,s1, s2, 2/6 sem apex   8. Positive Bowel Sounds, Abdomen Soft, No  tenderness, No organomegaly appriciated,No rebound -guarding or rigidity.  9.  No Cyanosis, Normal Skin Turgor, No Skin Rash or Bruise.  10. Good muscle tone,  joints appear normal , no effusions, Normal ROM.  11. No Palpable Lymph Nodes in Neck or Axillae    Data Review:    CBC Recent Labs  Lab 04/05/17 0324 04/05/17 0334  WBC 9.9  --   HGB 11.7* 11.9*  HCT 35.9* 35.0*  PLT 303  --   MCV 99.4  --   MCH 32.4  --   MCHC 32.6  --   RDW 16.8*  --   LYMPHSABS 2.1  --   MONOABS 0.7  --   EOSABS 0.2  --   BASOSABS 0.0  --    ------------------------------------------------------------------------------------------------------------------  Chemistries  Recent Labs  Lab 04/01/17 1622 04/05/17 0334  NA 141 140  K 5.7* 3.9  CL 101 104  CO2 26  --   GLUCOSE 97 138*  BUN 22 21*  CREATININE 0.76 0.70  CALCIUM 9.4  --    ------------------------------------------------------------------------------------------------------------------ estimated creatinine clearance is 26.5 mL/min (by C-G formula based on SCr of 0.7 mg/dL). ------------------------------------------------------------------------------------------------------------------ No results for input(s): TSH, T4TOTAL, T3FREE, THYROIDAB in the last 72 hours.  Invalid input(s): FREET3  Coagulation profile No results for input(s): INR, PROTIME in the last 168 hours. ------------------------------------------------------------------------------------------------------------------- No results for input(s): DDIMER in the last 72 hours. -------------------------------------------------------------------------------------------------------------------  Cardiac Enzymes Recent Labs  Lab 04/05/17 0324  TROPONINI 0.06*    ------------------------------------------------------------------------------------------------------------------    Component Value Date/Time   BNP 870.7 (H) 04/05/2017 0327     ---------------------------------------------------------------------------------------------------------------  Urinalysis    Component Value Date/Time   BILIRUBINUR n 03/16/2013 1504   PROTEINUR n 03/16/2013 1504   UROBILINOGEN 0.2 03/16/2013 1504   NITRITE n 03/16/2013 1504   LEUKOCYTESUR large (3+) 03/16/2013 1504    ----------------------------------------------------------------------------------------------------------------   Imaging Results:    Dg Chest Portable 1 View  Result Date: 04/05/2017 CLINICAL DATA:  Shortness of breath EXAM: PORTABLE CHEST 1 VIEW COMPARISON:  03/19/2017 FINDINGS: Cardiac enlargement with diffuse pulmonary vascular congestion. Diffuse interstitial infiltration throughout the lungs likely due to edema. Small bilateral pleural effusions. Changes are similar to previous study and suggests congestive heart failure. No pneumothorax. Tortuous aorta. Thoracic scoliosis convex towards the left. Degenerative changes in the shoulders. IMPRESSION: Cardiac enlargement with pulmonary vascular congestion and diffuse interstitial edema. Similar appearance to previous study. Electronically Signed   By: Lucienne Capers M.D.   On: 04/05/2017 03:44     Assessment & Plan:    Principal Problem:   SVT (supraventricular tachycardia) (HCC) Active Problems:   CHF (congestive heart failure)/Unspecified   Elevated troponin     SVT Tele Trop I q6h x3 Check tsh Check cardiac echo Pt received adenosine x2  Currently in NSR Cardiology consult  CHF (EF 50%) Lasix 20mg  iv qday Cont carvedilol Cont potassium   Troponin elevation Aspirin, lipid Check lipid  Hypothyroidism Cont levothyroid   DVT Prophylaxis Heparin -  Lovenox - SCDs  AM Labs Ordered, also please review Full  Orders  Family Communication: Admission, patients condition and plan of care including tests being ordered have been discussed with the patient who indicate understanding and agree with the plan and Code Status.  Code Status DNR  Likely DC to  [facility]  Condition GUARDED   Consults called: cardiology by ED   Admission status:inpatient  Time spent in minutes : 45   Jani Gravel M.D on 04/05/2017 at 6:27 AM  Between  7am to 7pm - Pager - [phone]. After 7pm go to [email or web address] - password [facility]  [facility] - Office  [phone]

## 2017-04-06 LAB — LIPID PANEL
CHOLESTEROL: 148 mg/dL (ref 0–200)
HDL: 83 mg/dL (ref >40)
LDL CALC: 53 mg/dL (ref 0–99)
TRIGLYCERIDES: 59 mg/dL (ref <150)
Total CHOL/HDL Ratio: 1.8 RATIO
VLDL: 12 mg/dL (ref 0–40)

## 2017-04-06 LAB — CBC
HCT: 35 % — ABNORMAL LOW (ref 36.0–46.0)
Hemoglobin: 11.5 g/dL — ABNORMAL LOW (ref 12.0–15.0)
MCH: 32.6 pg (ref 26.0–34.0)
MCHC: 32.9 g/dL (ref 30.0–36.0)
MCV: 99.2 fL (ref 78.0–100.0)
PLATELETS: 294 10*3/uL (ref 150–400)
RBC: 3.53 MIL/uL — ABNORMAL LOW (ref 3.87–5.11)
RDW: 16.8 % — AB (ref 11.5–15.5)
WBC: 11.2 10*3/uL — ABNORMAL HIGH (ref 4.0–10.5)

## 2017-04-06 LAB — BASIC METABOLIC PANEL
ANION GAP: 8 (ref 5–15)
BUN: 21 mg/dL — ABNORMAL HIGH (ref 6–20)
CALCIUM: 8.6 mg/dL — AB (ref 8.9–10.3)
CHLORIDE: 99 mmol/L — AB (ref 101–111)
CO2: 26 mmol/L (ref 22–32)
Creatinine, Ser: 0.81 mg/dL (ref 0.44–1.00)
GFR calc non Af Amer: 59 mL/min — ABNORMAL LOW (ref >60)
Glucose, Bld: 161 mg/dL — ABNORMAL HIGH (ref 65–99)
POTASSIUM: 4 mmol/L (ref 3.5–5.1)
Sodium: 133 mmol/L — ABNORMAL LOW (ref 135–145)

## 2017-04-06 LAB — T4, FREE: Free T4: 1.15 ng/dL — ABNORMAL HIGH (ref 0.61–1.12)

## 2017-04-06 MED ORDER — LEVOTHYROXINE SODIUM 25 MCG PO TABS: 12.5000 ug | ORAL_TABLET | Freq: Every day | ORAL | Status: DC

## 2017-04-06 MED ORDER — FUROSEMIDE 20 MG PO TABS
20.0000 mg | ORAL_TABLET | Freq: Every day | ORAL | Status: DC
  Administered 2017-04-06 – 2017-04-08 (×3): 20 mg via ORAL
  Filled 2017-04-06 (×3): qty 1

## 2017-04-06 MED ORDER — ATORVASTATIN CALCIUM 20 MG PO TABS
20.0000 mg | ORAL_TABLET | Freq: Every day | ORAL | Status: DC
  Administered 2017-04-06 – 2017-04-07 (×2): 20 mg via ORAL
  Filled 2017-04-06 (×2): qty 1

## 2017-04-06 NOTE — Progress Notes (Signed)
Progress Note  Patient Name: Lori [name] Date of Encounter: 04/06/2017  Primary Cardiologist: Harrington Challenger  Subjective   Moved  to [place] no SVT recurrence   Inpatient Medications    Scheduled Meds: . amiodarone  100 mg Oral BID  . aspirin EC  81 mg Oral Daily  . atorvastatin  80 mg Oral q1800  . enoxaparin (LOVENOX) injection  30 mg Subcutaneous Q24H  . furosemide  20 mg Oral Daily  . [START ON 04/07/2017] levothyroxine  12.5 mcg Oral QAC breakfast  . metoprolol tartrate  25 mg Oral TID  . potassium chloride SA  20 mEq Oral Daily  . Riboflavin  100 mg Oral Daily  . sodium chloride flush  3 mL Intravenous Q12H   Continuous Infusions: . sodium chloride     PRN Meds: sodium chloride, acetaminophen **OR** acetaminophen, sodium chloride flush, triamcinolone cream   Vital Signs    Vitals:   04/06/17 0300 04/06/17 0921 04/06/17 1020 04/06/17 1205  BP: 119/67  105/75 (!) 103/57  Pulse: 76   71  Resp: (!) 24  (!) 22 (!) 26  Temp: 98.2 F (36.8 C) 99.1 F (37.3 C)    TempSrc: Oral Oral    SpO2: 92%   95%  Weight:      Height:        Intake/Output Summary (Last 24 hours) at 04/06/2017 1238 Last data filed at 04/06/2017 1021 Gross per 24 hour  Intake 543 ml  Output -  Net 543 ml   Filed Weights   04/05/17 0411 04/05/17 1532  Weight: 90 lb (40.8 kg) 89 lb 15.2 oz (40.8 kg)    Telemetry    NSR 04/06/2017  - Personally Reviewed  ECG    Sinus arrhythmia LVH PVC  - Personally Reviewed  Physical Exam  Elderly female  GEN: No acute distress.   Neck: No JVD Cardiac: RRR, MR murmurs, rubs, or gallops.  Respiratory: Clear to auscultation bilaterally. GI: Soft, nontender, non-distended  MS: No edema; No deformity. Neuro:  Nonfocal  Psych: Normal affect   Labs    Chemistry Recent Labs  Lab 04/01/17 1622 04/05/17 0334 04/05/17 1205 04/06/17 0201  NA 141 140 137 133*  K 5.7* 3.9 3.4* 4.0  CL 101 104 101 99*  CO2 26  --  26 26  GLUCOSE 97 138* 103*  161*  BUN 22 21* 14 21*  CREATININE 0.76 0.70 0.66 0.81  CALCIUM 9.4  --  8.5* 8.6*  PROT  --   --  6.6  --   ALBUMIN  --   --  3.0*  --   AST  --   --  62*  --   ALT  --   --  51  --   ALKPHOS  --   --  112  --   BILITOT  --   --  1.0  --   GFRNONAA 66  --  >60 59*  GFRAA 77  --  >60 >60  ANIONGAP  --   --  10 8     Hematology Recent Labs  Lab 04/05/17 0324 04/05/17 0334 04/06/17 0201  WBC 9.9  --  11.2*  RBC 3.61*  --  3.53*  HGB 11.7* 11.9* 11.5*  HCT 35.9* 35.0* 35.0*  MCV 99.4  --  99.2  MCH 32.4  --  32.6  MCHC 32.6  --  32.9  RDW 16.8*  --  16.8*  PLT 303  --  294  Cardiac Enzymes Recent Labs  Lab 04/05/17 0324 04/05/17 1205 04/05/17 1912 04/05/17 2151  TROPONINI 0.06* 0.08* 0.08* 0.07*    Recent Labs  Lab 04/05/17 0333  TROPIPOC 0.06     BNP Recent Labs  Lab 04/05/17 0327  BNP 870.7*     DDimer No results for input(s): DDIMER in the last 168 hours.   Radiology    Dg Chest Portable 1 View  Result Date: 04/05/2017 CLINICAL DATA:  Shortness of breath EXAM: PORTABLE CHEST 1 VIEW COMPARISON:  03/19/2017 FINDINGS: Cardiac enlargement with diffuse pulmonary vascular congestion. Diffuse interstitial infiltration throughout the lungs likely due to edema. Small bilateral pleural effusions. Changes are similar to previous study and suggests congestive heart failure. No pneumothorax. Tortuous aorta. Thoracic scoliosis convex towards the left. Degenerative changes in the shoulders. IMPRESSION: Cardiac enlargement with pulmonary vascular congestion and diffuse interstitial edema. Similar appearance to previous study. Electronically Signed   By: Lucienne Capers M.D.   On: 04/05/2017 03:44    Cardiac Studies   Echo EF 50% moderate to sever MR 02/04/17  Patient Profile     83 y.o. female admitted with SVT resolved with adenosine started on beta blocker And amiodarone. Mild CHF from rapid rate with known moderate to severe MR Not a candidate for repair  DNR  Assessment & Plan    SVT: resolved continue amiodarone and beta blocker CHF:  Likely from rapid rate and MR daily lasix improved Cholesterol : on statin given age not sure why she is on such a high dose  For questions or updates, please contact [facility] Please consult [email or web address] for contact info under Cardiology/STEMI.      Signed, Jenkins Rouge, MD  04/06/2017, 12:38 PM

## 2017-04-06 NOTE — Progress Notes (Signed)
PROGRESS NOTE    Lori [name]  EXB:[id number] DOB: [birth year] DOA: 04/05/2017 PCP: Lucretia Kern, DO    Brief Narrative:  Lori [name]  is a 83 y.o. female, w hypertension, cva,  w mod-severe MR, Diastolic HF, apparently c/o increase in dyspnea starting at about 2 am.  + palpitations and chest tightness.  she was found to be in SVT, HR in the 170. She received adenosine and converted to sinus. She was having dyspnea, was place on BIPAP transiently.  Patient has been admitted for SVt and HF exacerbation. Cardiology has been consulted.    Assessment & Plan:   Principal Problem:   SVT (supraventricular tachycardia) (HCC) Active Problems:   CHF (congestive heart failure)/Unspecified   Elevated troponin  1-SVT;  Mild elevation of troponin.  She is on coreg daily, this medication was started last admission.  Replace potasium.  Cardiology consulted. Follow recommendation.  Received adenosine.  Started on amiodarone. Metoprolol.  No further SVT.  2-Acute diastolic HF exacerbation. In setting of SVT.  Lasix change to oral.  Cardiology consulted.   3-Hypothyroidism; continue with Synthroid. TSH elevated.  T 4 mildly elevated. Await free t3. Will decrease dose.   4-Mild elevation troponin; in setting SVT  5-hypokalemia; replaced   6-confusion; suspect related to acute illness.   DVT prophylaxis: Lovenox Code Status: DNR Family Communication: no family at bedside.  Disposition Plan:  Home when stable.   Consultants:   Cardiology    Procedures:   none   Antimicrobials:   none   Subjective: She is confuse. Denies chest pain or dyspnea.   Objective: Vitals:   04/05/17 1949 04/05/17 2300 04/06/17 0300 04/06/17 0921  BP: 125/81 109/67 119/67   Pulse: 75 74 76   Resp: 16 (!) 37 (!) 24   Temp: 98.7 F (37.1 C) 98 F (36.7 C) 98.2 F (36.8 C) 99.1 F (37.3 C)  TempSrc: Oral Oral Oral Oral  SpO2: 96% 94% 92%   Weight:      Height:         Intake/Output Summary (Last 24 hours) at 04/06/2017 0925 Last data filed at 04/06/2017 0830 Gross per 24 hour  Intake 540 ml  Output 1300 ml  Net -760 ml   Filed Weights   04/05/17 0411 04/05/17 1532  Weight: 40.8 kg (90 lb) 40.8 kg (89 lb 15.2 oz)    Examination:  General exam: NAD Respiratory system: crackles bases.  Cardiovascular system: S 1, S 2 RRR Gastrointestinal system: BS present, soft, nt Central nervous system: non focal.  Extremities: symmetric power.  Skin: no rash Psychiatry: more calm today    Data Reviewed: I have personally reviewed following labs and imaging studies  CBC: Recent Labs  Lab 04/05/17 0324 04/05/17 0334 04/06/17 0201  WBC 9.9  --  11.2*  NEUTROABS 6.9  --   --   HGB 11.7* 11.9* 11.5*  HCT 35.9* 35.0* 35.0*  MCV 99.4  --  99.2  PLT 303  --  102   Basic Metabolic Panel: Recent Labs  Lab 04/01/17 1622 04/05/17 0324 04/05/17 0334 04/05/17 1205 04/06/17 0201  NA 141  --  140 137 133*  K 5.7*  --  3.9 3.4* 4.0  CL 101  --  104 101 99*  CO2 26  --   --  26 26  GLUCOSE 97  --  138* 103* 161*  BUN 22  --  21* 14 21*  CREATININE 0.76  --  0.70 0.66 0.81  CALCIUM  9.4  --   --  8.5* 8.6*  MG  --  2.0  --   --   --    GFR: Estimated Creatinine Clearance: 26.2 mL/min (by C-G formula based on SCr of 0.81 mg/dL). Liver Function Tests: Recent Labs  Lab 04/05/17 1205  AST 62*  ALT 51  ALKPHOS 112  BILITOT 1.0  PROT 6.6  ALBUMIN 3.0*   No results for input(s): LIPASE, AMYLASE in the last 168 hours. No results for input(s): AMMONIA in the last 168 hours. Coagulation Profile: No results for input(s): INR, PROTIME in the last 168 hours. Cardiac Enzymes: Recent Labs  Lab 04/05/17 0324 04/05/17 1205 04/05/17 1912 04/05/17 2151  TROPONINI 0.06* 0.08* 0.08* 0.07*   BNP (last 3 results) No results for input(s): PROBNP in the last 8760 hours. HbA1C: No results for input(s): HGBA1C in the last 72 hours. CBG: Recent Labs   Lab 04/05/17 1210  GLUCAP 100*   Lipid Profile: Recent Labs    04/06/17 0201  CHOL 148  HDL 83  LDLCALC 53  TRIG 59  CHOLHDL 1.8   Thyroid Function Tests: Recent Labs    04/05/17 1205 04/06/17 0201  TSH 6.429*  --   FREET4  --  1.15*   Anemia Panel: No results for input(s): VITAMINB12, FOLATE, FERRITIN, TIBC, IRON, RETICCTPCT in the last 72 hours. Sepsis Labs: No results for input(s): PROCALCITON, LATICACIDVEN in the last 168 hours.  Recent Results (from the past 240 hour(s))  Surgical PCR screen     Status: None   Collection Time: 04/05/17  9:45 AM  Result Value Ref Range Status   MRSA, PCR NEGATIVE NEGATIVE Final   Staphylococcus aureus NEGATIVE NEGATIVE Final    Comment: (NOTE) The Xpert SA Assay (FDA approved for NASAL specimens in patients 72 years of age and older), is one component of a comprehensive surveillance program. It is not intended to diagnose infection nor to guide or monitor treatment.          Radiology Studies: Dg Chest Portable 1 View  Result Date: 04/05/2017 CLINICAL DATA:  Shortness of breath EXAM: PORTABLE CHEST 1 VIEW COMPARISON:  03/19/2017 FINDINGS: Cardiac enlargement with diffuse pulmonary vascular congestion. Diffuse interstitial infiltration throughout the lungs likely due to edema. Small bilateral pleural effusions. Changes are similar to previous study and suggests congestive heart failure. No pneumothorax. Tortuous aorta. Thoracic scoliosis convex towards the left. Degenerative changes in the shoulders. IMPRESSION: Cardiac enlargement with pulmonary vascular congestion and diffuse interstitial edema. Similar appearance to previous study. Electronically Signed   By: Lucienne Capers M.D.   On: 04/05/2017 03:44        Scheduled Meds: . amiodarone  100 mg Oral BID  . aspirin EC  81 mg Oral Daily  . atorvastatin  80 mg Oral q1800  . enoxaparin (LOVENOX) injection  30 mg Subcutaneous Q24H  . [START ON 04/07/2017]  levothyroxine  12.5 mcg Oral QAC breakfast  . metoprolol tartrate  25 mg Oral TID  . potassium chloride SA  20 mEq Oral Daily  . Riboflavin  100 mg Oral Daily  . sodium chloride flush  3 mL Intravenous Q12H   Continuous Infusions: . sodium chloride       LOS: 1 day    Time spent: 35 minutes.     Elmarie Shiley, MD [facility] Pager [phone]  If 7PM-7AM, please contact night-coverage [email or web address] Password [email or web address] 04/06/2017, 9:25 AM

## 2017-04-06 NOTE — Progress Notes (Signed)
Patient received to room 3E17 accompanied by sitter.  Patient resting in bed / conversing with staff.  Oriented to staff only.  Pleasant, but unable to make decisions on own or to mobilize without assist.

## 2017-04-06 NOTE — Evaluation (Signed)
Physical Therapy Evaluation Patient Details Name: Lori [name] MRN: [id number] DOB: Sep 01, 1918 Today's Date: 04/06/2017   History of Present Illness  Pt is a 83 y.o. female with PMH consisting of HTN, CVA, macular degeneration, and mod-severe MR. She presented to the ED from [facility] with c/o increase in dyspnea. Pt admitted with dx of SVT.     Clinical Impression  Pt admitted with above diagnosis. Pt currently with functional limitations due to the deficits listed below (see PT Problem List). On eval, pt required min guard assist bed mobility, min assist transfers and min HHA of 1 ambulation 100 feet. Mobility limited by weakness and fatigue. Pt will benefit from skilled PT to increase their independence and safety with mobility to allow discharge to the venue listed below.       Follow Up Recommendations SNF    Equipment Recommendations  None recommended by PT    Recommendations for Other Services       Precautions / Restrictions Precautions Precautions: Fall Precaution Comments: Pt experienced a fall in 5/18 resulting in lumbar fractures per daugthter.      Mobility  Bed Mobility Overal bed mobility: Needs Assistance Bed Mobility: Supine to Sit;Sit to Supine     Supine to sit: Min guard;HOB elevated Sit to supine: Min guard;HOB elevated   General bed mobility comments: +rail, verbal cues for sequencing  Transfers Overall transfer level: Needs assistance Equipment used: None Transfers: Sit to/from [facility] Sit to Stand: Min assist Stand pivot transfers: Min assist       General transfer comment: assist to power up and stabilize initial standing balance  Ambulation/Gait Ambulation/Gait assistance: Min assist Ambulation Distance (Feet): 100 Feet Assistive device: 1 person hand held assist Gait Pattern/deviations: Step-through pattern;Decreased stride length Gait velocity: Decreased   General Gait Details: Pt fatigues quickly.  Pt continually stating "I don't know what I've done today but I feel like I've done too much. I'm so tired."  Financial trader [employer] (Stroke Patients Only)       Balance Overall balance assessment: Needs assistance Sitting-balance support: Feet supported;No upper extremity supported Sitting balance-Leahy Scale: Good     Standing balance support: Single extremity supported;During functional activity Standing balance-Leahy Scale: Poor Standing balance comment: unsteady gait                             Pertinent Vitals/Pain Pain Assessment: No/denies pain    Home Living Family/patient expects to be discharged to:: Assisted living                      Prior Function Level of Independence: Needs assistance   Gait / Transfers Assistance Needed: ambulates with SPC.  Has been receiving HHPT   ADL's / Homemaking Assistance Needed: Supervision to mod I with ADLs.  Comments: Pt moved to ALF 1.5 weeks ago. Prior to that move, she was living at home with her daughter.     Hand Dominance   Dominant Hand: Right    Extremity/Trunk Assessment   Upper Extremity Assessment Upper Extremity Assessment: Generalized weakness    Lower Extremity Assessment Lower Extremity Assessment: Generalized weakness    Cervical / Trunk Assessment Cervical / Trunk Assessment: Kyphotic  Communication   Communication: HOH  Cognition Arousal/Alertness: Awake/alert Behavior During Therapy: WFL for tasks assessed/performed Overall Cognitive Status: History of  cognitive impairments - at baseline                                 General Comments: daughter reports pt has had a progressive decline in memory over the last several months.  She currently is at baseline.      General Comments General comments (skin integrity, edema, etc.): max HR 91 during mobility    Exercises     Assessment/Plan    PT Assessment Patient  needs continued PT services  PT Problem List Decreased strength;Decreased activity tolerance;Decreased balance;Decreased mobility;Decreased knowledge of use of DME;Decreased safety awareness       PT Treatment Interventions DME instruction;Gait training;Stair training;Functional mobility training;Therapeutic activities;Balance training;Therapeutic exercise;Patient/family education    PT Goals (Current goals can be found in the Care Plan section)  Acute Rehab PT Goals Patient Stated Goal: feel better PT Goal Formulation: With patient/family Time For Goal Achievement: 04/20/17 Potential to Achieve Goals: Fair    Frequency Min 3X/week   Barriers to discharge        Co-evaluation               AM-PAC PT "6 Clicks" Daily Activity  Outcome Measure Difficulty turning over in bed (including adjusting bedclothes, sheets and blankets)?: A Little Difficulty moving from lying on back to sitting on the side of the bed? : A Little Difficulty sitting down on and standing up from a chair with arms (e.g., wheelchair, bedside commode, etc,.)?: Unable Help needed moving to and from a bed to chair (including a wheelchair)?: A Little Help needed walking in hospital room?: A Little Help needed climbing 3-5 steps with a railing? : A Little 6 Click Score: 16    End of Session Equipment Utilized During Treatment: Gait belt Activity Tolerance: Patient tolerated treatment well Patient left: in bed;with nursing/sitter in room;with family/visitor present Nurse Communication: Mobility status PT Visit Diagnosis: Unsteadiness on feet (R26.81);Muscle weakness (generalized) (M62.81)    Time: 6195-0932 PT Time Calculation (min) (ACUTE ONLY): 40 min   Charges:   PT Evaluation $PT Eval Moderate Complexity: 1 Mod PT Treatments $Gait Training: 8-22 mins   PT G Codes:        Lorrin Goodell, PT  Office # [phone] Pager [phone]   Lorriane Shire 04/06/2017, 3:21 PM

## 2017-04-06 NOTE — Progress Notes (Signed)
On assessment, patient no longer wearing posey belt restraint and is actively participating in socializing and in daily activities / mobility.  Affect cheerful.  Daughter in attendance at this time.  Will be participating in therapies this afternoon.  1:1 sitter remains due to high risk of injury from falls (patient fell on 03/29/2017, sustaining a cut on her left posterior scalp area, requiring one staple).  Posey order will be allowed to expire based on fact that patient appears to be no longer as confused as before and the use of a 1:1 safety sitter would be more beneficial.

## 2017-04-07 DIAGNOSIS — R748 Abnormal levels of other serum enzymes: Secondary | ICD-10-CM

## 2017-04-07 DIAGNOSIS — I5031 Acute diastolic (congestive) heart failure: Secondary | ICD-10-CM

## 2017-04-07 DIAGNOSIS — I340 Nonrheumatic mitral (valve) insufficiency: Secondary | ICD-10-CM

## 2017-04-07 DIAGNOSIS — E7849 Other hyperlipidemia: Secondary | ICD-10-CM

## 2017-04-07 LAB — T3, FREE: T3 FREE: 1.8 pg/mL — AB (ref 2.0–4.4)

## 2017-04-07 MED ORDER — LEVOTHYROXINE SODIUM 25 MCG PO TABS
25.0000 ug | ORAL_TABLET | Freq: Every day | ORAL | Status: DC
  Administered 2017-04-07 – 2017-04-08 (×2): 25 ug via ORAL
  Filled 2017-04-07 (×3): qty 1

## 2017-04-07 MED ORDER — METOPROLOL TARTRATE 25 MG PO TABS
37.5000 mg | ORAL_TABLET | Freq: Two times a day (BID) | ORAL | Status: DC
Start: 2017-04-07 — End: 2017-04-08
  Administered 2017-04-07 – 2017-04-08 (×2): 37.5 mg via ORAL
  Filled 2017-04-07 (×2): qty 1

## 2017-04-07 NOTE — Progress Notes (Signed)
PROGRESS NOTE    Lori [name]  TDH:[id number] DOB: 1918-11-09 DOA: 04/05/2017 PCP: Lucretia Kern, DO    Brief Narrative:  Lori [name]  is a 83 y.o. female, w hypertension, cva,  w mod-severe MR, Diastolic HF, apparently c/o increase in dyspnea starting at about 2 am.  + palpitations and chest tightness.  she was found to be in SVT, HR in the 170. She received adenosine and converted to sinus. She was having dyspnea, was place on BIPAP transiently.  Patient has been admitted for SVt and HF exacerbation. Cardiology has been consulted.    Assessment & Plan:   Principal Problem:   SVT (supraventricular tachycardia) (HCC) Active Problems:   CHF (congestive heart failure)/Unspecified   Elevated troponin  1-SVT;  Mild elevation of troponin.  She is on coreg daily, this medication was started last admission.  Replace potasium.  Cardiology consulted. Follow recommendation.  Received adenosine.  Started on amiodarone. Metoprolol.  No further SVT.  2-Acute diastolic HF exacerbation. In setting of SVT.  Lasix change to oral. Negative 1.7 L Cardiology consulted.   3-Hypothyroidism; continue with Synthroid. TSH elevated.  T 4 mildly elevated. Await free t3. Will decrease dose.   4-Mild elevation troponin; in setting SVT  5-hypokalemia; replaced   6-Confusion; suspect related to acute illness. Improved today.   DVT prophylaxis: Lovenox Code Status: DNR Family Communication: no family at bedside.  Disposition Plan:  Home when stable.   Consultants:   Cardiology    Procedures:   none   Antimicrobials:   none   Subjective: Report mild dyspnea. She was oriented times 3.   Objective: Vitals:   04/07/17 0802 04/07/17 1100 04/07/17 1105 04/07/17 1339  BP: 132/83   103/71  Pulse: 70   62  Resp:      Temp:      TempSrc:      SpO2: 93% 95% 95% 95%  Weight:      Height:        Intake/Output Summary (Last 24 hours) at 04/07/2017 1414 Last data filed at  04/07/2017 1327 Gross per 24 hour  Intake 360 ml  Output -  Net 360 ml   Filed Weights   04/05/17 0411 04/05/17 1532 04/07/17 0540  Weight: 40.8 kg (90 lb) 40.8 kg (89 lb 15.2 oz) 40.1 kg (88 lb 4.8 oz)    Examination:  General exam: NAD Respiratory system: Bilateral crackles.  Cardiovascular system: S 1, S 2 RRR Gastrointestinal system: BS present, soft, nt Central nervous system: non focal.  Extremities: Symmetric power.  Skin: no rash Psychiatry: calm    Data Reviewed: I have personally reviewed following labs and imaging studies  CBC: Recent Labs  Lab 04/05/17 0324 04/05/17 0334 04/06/17 0201  WBC 9.9  --  11.2*  NEUTROABS 6.9  --   --   HGB 11.7* 11.9* 11.5*  HCT 35.9* 35.0* 35.0*  MCV 99.4  --  99.2  PLT 303  --  646   Basic Metabolic Panel: Recent Labs  Lab 04/01/17 1622 04/05/17 0324 04/05/17 0334 04/05/17 1205 04/06/17 0201  NA 141  --  140 137 133*  K 5.7*  --  3.9 3.4* 4.0  CL 101  --  104 101 99*  CO2 26  --   --  26 26  GLUCOSE 97  --  138* 103* 161*  BUN 22  --  21* 14 21*  CREATININE 0.76  --  0.70 0.66 0.81  CALCIUM 9.4  --   --  8.5* 8.6*  MG  --  2.0  --   --   --    GFR: Estimated Creatinine Clearance: 25.7 mL/min (by C-G formula based on SCr of 0.81 mg/dL). Liver Function Tests: Recent Labs  Lab 04/05/17 1205  AST 62*  ALT 51  ALKPHOS 112  BILITOT 1.0  PROT 6.6  ALBUMIN 3.0*   No results for input(s): LIPASE, AMYLASE in the last 168 hours. No results for input(s): AMMONIA in the last 168 hours. Coagulation Profile: No results for input(s): INR, PROTIME in the last 168 hours. Cardiac Enzymes: Recent Labs  Lab 04/05/17 0324 04/05/17 1205 04/05/17 1912 04/05/17 2151  TROPONINI 0.06* 0.08* 0.08* 0.07*   BNP (last 3 results) No results for input(s): PROBNP in the last 8760 hours. HbA1C: No results for input(s): HGBA1C in the last 72 hours. CBG: Recent Labs  Lab 04/05/17 1210  GLUCAP 100*   Lipid  Profile: Recent Labs    04/06/17 0201  CHOL 148  HDL 83  LDLCALC 53  TRIG 59  CHOLHDL 1.8   Thyroid Function Tests: Recent Labs    04/05/17 1205 04/06/17 0201  TSH 6.429*  --   FREET4  --  1.15*  T3FREE  --  1.8*   Anemia Panel: No results for input(s): VITAMINB12, FOLATE, FERRITIN, TIBC, IRON, RETICCTPCT in the last 72 hours. Sepsis Labs: No results for input(s): PROCALCITON, LATICACIDVEN in the last 168 hours.  Recent Results (from the past 240 hour(s))  Surgical PCR screen     Status: None   Collection Time: 04/05/17  9:45 AM  Result Value Ref Range Status   MRSA, PCR NEGATIVE NEGATIVE Final   Staphylococcus aureus NEGATIVE NEGATIVE Final    Comment: (NOTE) The Xpert SA Assay (FDA approved for NASAL specimens in patients 42 years of age and older), is one component of a comprehensive surveillance program. It is not intended to diagnose infection nor to guide or monitor treatment.          Radiology Studies: No results found.      Scheduled Meds: . amiodarone  100 mg Oral BID  . aspirin EC  81 mg Oral Daily  . atorvastatin  20 mg Oral q1800  . enoxaparin (LOVENOX) injection  30 mg Subcutaneous Q24H  . furosemide  20 mg Oral Daily  . levothyroxine  25 mcg Oral QAC breakfast  . metoprolol tartrate  37.5 mg Oral BID  . potassium chloride SA  20 mEq Oral Daily  . Riboflavin  100 mg Oral Daily  . sodium chloride flush  3 mL Intravenous Q12H   Continuous Infusions: . sodium chloride       LOS: 2 days    Time spent: 35 minutes.     Elmarie Shiley, MD [facility] Pager [phone]  If 7PM-7AM, please contact night-coverage [email or web address] Password [facility] 04/07/2017, 2:14 PM

## 2017-04-07 NOTE — Evaluation (Signed)
Occupational Therapy Evaluation Patient Details Name: Lori [name] MRN: [id number] DOB: 11/28/1918 Today's Date: 04/07/2017    History of Present Illness Pt is a 83 y.o. female with PMH consisting of HTN, CVA, macular degeneration, and mod-severe MR. She presented to the ED from [facility] ALF with c/o increase in dyspnea. Pt admitted with dx of SVT.      Clinical Impression   Pt admitted for above. Unsure of pt's true PLOF with ADLs. Feel pt will benefit from acute OT to increase independence prior to d/c. Recommending SNF for d/c.    Follow Up Recommendations  SNF    Equipment Recommendations  Other (comment)(defer to next venue)    Recommendations for Other Services       Precautions / Restrictions Precautions Precautions: Fall Restrictions Weight Bearing Restrictions: No      Mobility Bed Mobility Overal bed mobility: Needs Assistance Bed Mobility: Supine to Sit;Sit to Supine     Supine to sit: Supervision Sit to supine: Modified independent (Device/Increase time)      Transfers Overall transfer level: Needs assistance   Transfers: Sit to/from Stand Sit to Stand: Min guard              Balance      Hand held assist for ambulation.                                     ADL either performed or assessed with clinical judgement   ADL Overall ADL's : Needs assistance/impaired   Eating/Feeding Details (indicate cue type and reason): drank water while standing with no diffuculty-supervision Grooming: Brushing hair;Wash/dry face;Standing;Set up;Supervision/safety   Upper Body Bathing: Set up;Supervision/ safety;Sitting   Lower Body Bathing: Min guard;Sit to/from stand   Upper Body Dressing : Moderate assistance;Sitting Upper Body Dressing Details (indicate cue type and reason): trouble with gown Lower Body Dressing: Min guard;Sit to/from stand Lower Body Dressing Details (indicate cue type and reason): able to don/doff  socks Toilet Transfer: Minimal assistance;Ambulation(sit to stand from bed and chair; Min A-ambulation)           Functional mobility during ADLs: Minimal assistance(hand held assist) General ADL Comments: Pt ambulated to sink and performed some ADLs. Educated on energy conservation.      Vision Baseline Vision/History: Macular Degeneration       Perception     Praxis      Pertinent Vitals/Pain Pain Assessment: No/denies pain     Hand Dominance     Extremity/Trunk Assessment Upper Extremity Assessment Upper Extremity Assessment: Overall WFL for tasks assessed   Lower Extremity Assessment Lower Extremity Assessment: Defer to PT evaluation       Communication Communication Communication: HOH   Cognition Arousal/Alertness: Awake/alert Behavior During Therapy: WFL for tasks assessed/performed Overall Cognitive Status: History of cognitive impairments - at baseline                                     General Comments       Exercises     Shoulder Instructions      Home Living Family/patient expects to be discharged to:: Unsure Living Arrangements: Children  Prior Functioning/Environment Level of Independence: Needs assistance  Gait / Transfers Assistance Needed: ambulates with SPC.  Has been receiving HHPT  ADL's / Homemaking Assistance Needed: Supervision to mod I with ADLs.   Comments: unsure of true PLOF of ADLs        OT Problem List: Decreased strength;Decreased activity tolerance;Impaired balance (sitting and/or standing);Impaired vision/perception;Decreased cognition;Decreased knowledge of use of DME or AE;Decreased knowledge of precautions      OT Treatment/Interventions: Self-care/ADL training;Energy conservation;DME and/or AE instruction;Therapeutic activities;Cognitive remediation/compensation;Patient/family education;Balance training;Visual/perceptual remediation/compensation     OT Goals(Current goals can be found in the care plan section) Acute Rehab OT Goals Patient Stated Goal: not stated OT Goal Formulation: With patient Time For Goal Achievement: 04/14/17 Potential to Achieve Goals: Good ADL Goals Pt Will Perform Lower Body Bathing: with supervision;sit to/from stand Pt Will Perform Lower Body Dressing: with supervision;sit to/from stand Pt Will Transfer to Toilet: with supervision;ambulating Pt Will Perform Toileting - Clothing Manipulation and hygiene: with supervision;sit to/from stand  OT Frequency: Min 2X/week   Barriers to D/C:            Co-evaluation              AM-PAC PT "6 Clicks" Daily Activity     Outcome Measure Help from another person eating meals?: A Little Help from another person taking care of personal grooming?: A Little Help from another person toileting, which includes using toliet, bedpan, or urinal?: A Little Help from another person bathing (including washing, rinsing, drying)?: A Little Help from another person to put on and taking off regular upper body clothing?: A Lot Help from another person to put on and taking off regular lower body clothing?: A Little 6 Click Score: 17   End of Session Equipment Utilized During Treatment: Gait belt  Activity Tolerance: Patient tolerated treatment well Patient left: in bed;with nursing/sitter in room  OT Visit Diagnosis: Unsteadiness on feet (R26.81);Muscle weakness (generalized) (M62.81)                Time: 5102-5852 OT Time Calculation (min): 17 min Charges:  OT General Charges $OT Visit: 1 Visit OT Evaluation $OT Eval Moderate Complexity: 1 Mod G-Codes:       [name] [name] OTR/L 04/07/2017, 8:50 AM

## 2017-04-07 NOTE — Progress Notes (Signed)
Progress Note  Patient Name: Lori [name] Date of Encounter: 04/07/2017  Primary Cardiologist: Dr. Percival Spanish  Subjective   Denies chest pain and palpitations. Occasionally has mild shortness of breath, stable from yesterday.  Inpatient Medications    Scheduled Meds: . amiodarone  100 mg Oral BID  . aspirin EC  81 mg Oral Daily  . atorvastatin  20 mg Oral q1800  . enoxaparin (LOVENOX) injection  30 mg Subcutaneous Q24H  . furosemide  20 mg Oral Daily  . levothyroxine  25 mcg Oral QAC breakfast  . metoprolol tartrate  25 mg Oral TID  . potassium chloride SA  20 mEq Oral Daily  . Riboflavin  100 mg Oral Daily  . sodium chloride flush  3 mL Intravenous Q12H   Continuous Infusions: . sodium chloride     PRN Meds: sodium chloride, acetaminophen **OR** acetaminophen, sodium chloride flush, triamcinolone cream   Vital Signs    Vitals:   04/07/17 0004 04/07/17 0530 04/07/17 0540 04/07/17 0802  BP: 121/77   132/83  Pulse: 64 75  70  Resp: 18 18    Temp: 97.7 F (36.5 C) 97.9 F (36.6 C)    TempSrc: Oral Oral    SpO2: 95% 91%  93%  Weight:   88 lb 4.8 oz (40.1 kg)   Height:        Intake/Output Summary (Last 24 hours) at 04/07/2017 1034 Last data filed at 04/07/2017 0914 Gross per 24 hour  Intake 240 ml  Output -  Net 240 ml   Filed Weights   04/05/17 0411 04/05/17 1532 04/07/17 0540  Weight: 90 lb (40.8 kg) 89 lb 15.2 oz (40.8 kg) 88 lb 4.8 oz (40.1 kg)    Telemetry    Sinus rhythm with PAC's - Personally Reviewed   Physical Exam   GEN: No acute distress.   Neck: No JVD Cardiac: RRR, 4/6 apical holosystolic murmur with radiation to axilla, no rubs or gallops.  Respiratory: Clear to auscultation bilaterally. GI: Soft, nontender, non-distended  MS: No edema; No deformity. Neuro:  Nonfocal  Psych: Normal affect   Labs    Chemistry Recent Labs  Lab 04/01/17 1622 04/05/17 0334 04/05/17 1205 04/06/17 0201  NA 141 140 137 133*  K 5.7* 3.9 3.4*  4.0  CL 101 104 101 99*  CO2 26  --  26 26  GLUCOSE 97 138* 103* 161*  BUN 22 21* 14 21*  CREATININE 0.76 0.70 0.66 0.81  CALCIUM 9.4  --  8.5* 8.6*  PROT  --   --  6.6  --   ALBUMIN  --   --  3.0*  --   AST  --   --  62*  --   ALT  --   --  51  --   ALKPHOS  --   --  112  --   BILITOT  --   --  1.0  --   GFRNONAA 66  --  >60 59*  GFRAA 77  --  >60 >60  ANIONGAP  --   --  10 8     Hematology Recent Labs  Lab 04/05/17 0324 04/05/17 0334 04/06/17 0201  WBC 9.9  --  11.2*  RBC 3.61*  --  3.53*  HGB 11.7* 11.9* 11.5*  HCT 35.9* 35.0* 35.0*  MCV 99.4  --  99.2  MCH 32.4  --  32.6  MCHC 32.6  --  32.9  RDW 16.8*  --  16.8*  PLT 303  --  294    Cardiac Enzymes Recent Labs  Lab 04/05/17 0324 04/05/17 1205 04/05/17 1912 04/05/17 2151  TROPONINI 0.06* 0.08* 0.08* 0.07*    Recent Labs  Lab 04/05/17 0333  TROPIPOC 0.06     BNP Recent Labs  Lab 04/05/17 0327  BNP 870.7*     DDimer No results for input(s): DDIMER in the last 168 hours.   Radiology    No results found.  Cardiac Studies   None this admission  Patient Profile     83 y.o. female admitted with SVT which resolved with adenosine. She was started on a beta blocker and amiodarone. Mild CHF from rapid rate with known moderate to severe MR. Not a candidate for repair DNR    Assessment & Plan    1. SVT: Resolved. Continue amiodarone. I will switch metoprolol dosing to 37.5 mg bid for feasibility of administration.  2. Acute diastolic heart failure: Resolved. Continue oral Lasix. Due to SVT.  3. Severe mitral regurgitation: Not a candidate for valve repair due to age.  4. Hyperlipidemia: On statin.  Disposition: No further recommendations. Will sign off. Can be discharged from my standpoint.  For questions or updates, please contact [place] Please consult [email or web address] for contact info under Cardiology/STEMI.      Signed, Kate Sable, MD  04/07/2017, 10:34 AM

## 2017-04-08 DIAGNOSIS — Z8679 Personal history of other diseases of the circulatory system: Secondary | ICD-10-CM | POA: Diagnosis not present

## 2017-04-08 DIAGNOSIS — S065X9A Traumatic subdural hemorrhage with loss of consciousness of unspecified duration, initial encounter: Secondary | ICD-10-CM | POA: Diagnosis not present

## 2017-04-08 DIAGNOSIS — I110 Hypertensive heart disease with heart failure: Secondary | ICD-10-CM | POA: Diagnosis not present

## 2017-04-08 DIAGNOSIS — J90 Pleural effusion, not elsewhere classified: Secondary | ICD-10-CM | POA: Diagnosis not present

## 2017-04-08 DIAGNOSIS — E43 Unspecified severe protein-calorie malnutrition: Secondary | ICD-10-CM | POA: Diagnosis not present

## 2017-04-08 DIAGNOSIS — R4182 Altered mental status, unspecified: Secondary | ICD-10-CM | POA: Diagnosis not present

## 2017-04-08 DIAGNOSIS — I5030 Unspecified diastolic (congestive) heart failure: Secondary | ICD-10-CM | POA: Diagnosis not present

## 2017-04-08 DIAGNOSIS — J9601 Acute respiratory failure with hypoxia: Secondary | ICD-10-CM | POA: Diagnosis not present

## 2017-04-08 DIAGNOSIS — I471 Supraventricular tachycardia: Secondary | ICD-10-CM | POA: Diagnosis not present

## 2017-04-08 DIAGNOSIS — R5381 Other malaise: Secondary | ICD-10-CM | POA: Diagnosis not present

## 2017-04-08 DIAGNOSIS — R601 Generalized edema: Secondary | ICD-10-CM | POA: Diagnosis not present

## 2017-04-08 DIAGNOSIS — F419 Anxiety disorder, unspecified: Secondary | ICD-10-CM | POA: Diagnosis not present

## 2017-04-08 DIAGNOSIS — Z7189 Other specified counseling: Secondary | ICD-10-CM | POA: Diagnosis not present

## 2017-04-08 DIAGNOSIS — R479 Unspecified speech disturbances: Secondary | ICD-10-CM | POA: Diagnosis not present

## 2017-04-08 DIAGNOSIS — F0150 Vascular dementia without behavioral disturbance: Secondary | ICD-10-CM | POA: Diagnosis not present

## 2017-04-08 DIAGNOSIS — Z8673 Personal history of transient ischemic attack (TIA), and cerebral infarction without residual deficits: Secondary | ICD-10-CM | POA: Diagnosis not present

## 2017-04-08 DIAGNOSIS — F339 Major depressive disorder, recurrent, unspecified: Secondary | ICD-10-CM | POA: Diagnosis not present

## 2017-04-08 DIAGNOSIS — Z8542 Personal history of malignant neoplasm of other parts of uterus: Secondary | ICD-10-CM | POA: Diagnosis not present

## 2017-04-08 DIAGNOSIS — R4189 Other symptoms and signs involving cognitive functions and awareness: Secondary | ICD-10-CM | POA: Diagnosis not present

## 2017-04-08 DIAGNOSIS — Z79899 Other long term (current) drug therapy: Secondary | ICD-10-CM | POA: Diagnosis not present

## 2017-04-08 DIAGNOSIS — I5020 Unspecified systolic (congestive) heart failure: Secondary | ICD-10-CM | POA: Diagnosis not present

## 2017-04-08 DIAGNOSIS — R0902 Hypoxemia: Secondary | ICD-10-CM | POA: Diagnosis not present

## 2017-04-08 DIAGNOSIS — I6200 Nontraumatic subdural hemorrhage, unspecified: Secondary | ICD-10-CM | POA: Diagnosis not present

## 2017-04-08 DIAGNOSIS — R41841 Cognitive communication deficit: Secondary | ICD-10-CM | POA: Diagnosis not present

## 2017-04-08 DIAGNOSIS — S0990XA Unspecified injury of head, initial encounter: Secondary | ICD-10-CM | POA: Diagnosis not present

## 2017-04-08 DIAGNOSIS — I10 Essential (primary) hypertension: Secondary | ICD-10-CM | POA: Diagnosis not present

## 2017-04-08 DIAGNOSIS — D72829 Elevated white blood cell count, unspecified: Secondary | ICD-10-CM | POA: Diagnosis not present

## 2017-04-08 DIAGNOSIS — R2681 Unsteadiness on feet: Secondary | ICD-10-CM | POA: Diagnosis not present

## 2017-04-08 DIAGNOSIS — I5040 Unspecified combined systolic (congestive) and diastolic (congestive) heart failure: Secondary | ICD-10-CM | POA: Diagnosis not present

## 2017-04-08 DIAGNOSIS — I509 Heart failure, unspecified: Secondary | ICD-10-CM | POA: Diagnosis not present

## 2017-04-08 DIAGNOSIS — R634 Abnormal weight loss: Secondary | ICD-10-CM | POA: Diagnosis not present

## 2017-04-08 DIAGNOSIS — R29898 Other symptoms and signs involving the musculoskeletal system: Secondary | ICD-10-CM | POA: Diagnosis not present

## 2017-04-08 DIAGNOSIS — M6281 Muscle weakness (generalized): Secondary | ICD-10-CM | POA: Diagnosis not present

## 2017-04-08 DIAGNOSIS — E440 Moderate protein-calorie malnutrition: Secondary | ICD-10-CM | POA: Diagnosis not present

## 2017-04-08 DIAGNOSIS — I340 Nonrheumatic mitral (valve) insufficiency: Secondary | ICD-10-CM | POA: Diagnosis not present

## 2017-04-08 DIAGNOSIS — D649 Anemia, unspecified: Secondary | ICD-10-CM | POA: Diagnosis not present

## 2017-04-08 DIAGNOSIS — R05 Cough: Secondary | ICD-10-CM | POA: Diagnosis not present

## 2017-04-08 DIAGNOSIS — E039 Hypothyroidism, unspecified: Secondary | ICD-10-CM | POA: Diagnosis not present

## 2017-04-08 DIAGNOSIS — I5033 Acute on chronic diastolic (congestive) heart failure: Secondary | ICD-10-CM | POA: Diagnosis not present

## 2017-04-08 DIAGNOSIS — R4781 Slurred speech: Secondary | ICD-10-CM | POA: Diagnosis not present

## 2017-04-08 DIAGNOSIS — Z7982 Long term (current) use of aspirin: Secondary | ICD-10-CM | POA: Diagnosis not present

## 2017-04-08 DIAGNOSIS — R410 Disorientation, unspecified: Secondary | ICD-10-CM | POA: Diagnosis not present

## 2017-04-08 DIAGNOSIS — R531 Weakness: Secondary | ICD-10-CM | POA: Diagnosis not present

## 2017-04-08 DIAGNOSIS — Z85038 Personal history of other malignant neoplasm of large intestine: Secondary | ICD-10-CM | POA: Diagnosis not present

## 2017-04-08 DIAGNOSIS — R630 Anorexia: Secondary | ICD-10-CM | POA: Diagnosis not present

## 2017-04-08 DIAGNOSIS — E871 Hypo-osmolality and hyponatremia: Secondary | ICD-10-CM | POA: Diagnosis not present

## 2017-04-08 DIAGNOSIS — R0600 Dyspnea, unspecified: Secondary | ICD-10-CM | POA: Diagnosis not present

## 2017-04-08 LAB — GLUCOSE, CAPILLARY: GLUCOSE-CAPILLARY: 101 mg/dL — AB (ref 65–99)

## 2017-04-08 MED ORDER — ASPIRIN 81 MG PO TBEC: 81.0000 mg | DELAYED_RELEASE_TABLET | Freq: Every day | ORAL | 0 refills | Status: DC

## 2017-04-08 MED ORDER — ATORVASTATIN CALCIUM 20 MG PO TABS: 20.0000 mg | ORAL_TABLET | Freq: Every day | ORAL | 0 refills | Status: DC

## 2017-04-08 MED ORDER — METOPROLOL TARTRATE 37.5 MG PO TABS: 37.5000 mg | ORAL_TABLET | Freq: Two times a day (BID) | ORAL | 0 refills | Status: DC

## 2017-04-08 MED ORDER — AMIODARONE HCL 100 MG PO TABS: 100.0000 mg | ORAL_TABLET | Freq: Two times a day (BID) | ORAL | 0 refills | Status: AC

## 2017-04-08 MED FILL — Riboflavin Tab 100 MG: ORAL | Qty: 1 | Status: AC

## 2017-04-08 NOTE — Progress Notes (Signed)
Patient discharge to [facility] SNF, transported by daughter. D/c paper works given to the daughter. Report given to [facility].

## 2017-04-08 NOTE — NC FL2 (Addendum)
[place] LEVEL OF CARE SCREENING TOOL     IDENTIFICATION  Patient Name: Lori [name] Birthdate: [birth year] Sex: female Admission Date (Current Location): 04/05/2017  [facility] and [place] Number:  Herbalist and Address:  [facility], [place] [address] [place], [place] [address]      Provider Number: [id number]  Attending Physician Name and Address:  Elmarie Shiley, MD  Relative Name and Phone Number:  Hydia Copelin, daughter, [phone]    Current Level of Care: Hospital Recommended Level of Care: [place] Prior Approval Number:    Date Approved/Denied: 10/10/16 PASRR Number: [id number]  Discharge Plan: SNF    Current Diagnoses: Patient Active Problem List   Diagnosis Date Noted  . SVT (supraventricular tachycardia) ([place]) 04/05/2017  . Elevated troponin 04/05/2017  . History of PAT (paroxysmal atrial tachycardia) 04/01/2017  . Acute on chronic diastolic heart failure ([place]) 03/19/2017  . Hypertension 03/19/2017  . Hypothyroidism, adult 03/19/2017  . Acute respiratory insufficiency 03/19/2017  . Moderate to severe mitral regurgitation 02/19/2017  . Community acquired pneumonia/Rt LL 02/03/2017  . CHF (congestive heart failure)/Unspecified 02/03/2017  . Vertebral fracture, osteoporotic/T5 02/03/2017  . Sternal fracture 02/03/2017  . Hyperglycemia 10/10/2016  . Back pain 10/09/2016  . Change in bowel habits 07/24/2013  . History of colon cancer 07/24/2013  . History of endometrial cancer 07/24/2013  . Loss of weight 07/24/2013  . Cystitis 03/16/2013  . Essential hypertension, benign 11/24/2012  . Hx of TIA (transient ischemic attack)  11/24/2012  . Hypothyroidism 11/24/2012    Orientation RESPIRATION BLADDER Height & Weight     Self, Place  Normal Continent Weight: 89 lb 4.8 oz (40.5 kg)(c scale) Height:  4\' 11"  (149.9 cm)  BEHAVIORAL SYMPTOMS/MOOD NEUROLOGICAL BOWEL NUTRITION STATUS       Continent Diet  AMBULATORY STATUS COMMUNICATION OF NEEDS Skin   Limited Assist Verbally Normal                       Personal Care Assistance Level of Assistance  Bathing, Feeding, Dressing Bathing Assistance: Limited assistance Feeding assistance: Limited assistance Dressing Assistance: Maximum assistance     Functional Limitations Info  Sight, Speech, Hearing Sight Info: Impaired(glasses) Hearing Info: Impaired(hearing aids) Speech Info: Adequate    SPECIAL CARE FACTORS FREQUENCY  PT (By licensed PT), OT (By licensed OT)     PT Frequency: 5x week OT Frequency: 5x week            Contractures Contractures Info: Not present    Additional Factors Info  Code Status, Allergies Code Status Info: DNR Allergies Info: BACTRIM SULFAMETHOXAZOLE-TRIMETHOPRIM, CLINDAMYCIN/LINCOMYCIN, MACROBID NITROFURANTOIN MACROCRYSTAL, PENICILLINS, SULFA ANTIBIOTICS, ZOFRAN ONDANSETRON HCL            Current Medications (04/08/2017):  This is the current hospital active medication list Current Facility-Administered Medications  Medication Dose Route Frequency Provider Last Rate Last Dose  . 0.9 %  sodium chloride infusion  250 mL Intravenous PRN Jani Gravel, MD      . acetaminophen (TYLENOL) tablet 650 mg  650 mg Oral Q6H PRN Jani Gravel, MD   650 mg at 04/07/17 2337   Or  . acetaminophen (TYLENOL) suppository 650 mg  650 mg Rectal Q6H PRN Jani Gravel, MD      . amiodarone (PACERONE) tablet 100 mg  100 mg Oral BID Fay Records, MD   100 mg at 04/08/17 0830  . aspirin EC tablet 81 mg  81  mg Oral Daily Jani Gravel, MD   81 mg at 04/08/17 0831  . atorvastatin (LIPITOR) tablet 20 mg  20 mg Oral q1800 Regalado, Belkys A, MD   20 mg at 04/07/17 1725  . enoxaparin (LOVENOX) injection 30 mg  30 mg Subcutaneous Q24H Jani Gravel, MD   30 mg at 04/08/17 0831  . furosemide (LASIX) tablet 20 mg  20 mg Oral Daily Regalado, Belkys A, MD   20 mg at 04/08/17 0831  . levothyroxine (SYNTHROID,  LEVOTHROID) tablet 25 mcg  25 mcg Oral QAC breakfast Regalado, Belkys A, MD   25 mcg at 04/08/17 0606  . metoprolol tartrate (LOPRESSOR) tablet 37.5 mg  37.5 mg Oral BID Kate Sable A, MD   37.5 mg at 04/08/17 0830  . potassium chloride SA (K-DUR,KLOR-CON) CR tablet 20 mEq  20 mEq Oral Daily Jani Gravel, MD   20 mEq at 04/08/17 0831  . Riboflavin TABS 100 mg  100 mg Oral Daily Jani Gravel, MD   100 mg at 04/08/17 0850  . sodium chloride flush (NS) 0.9 % injection 3 mL  3 mL Intravenous Q12H Jani Gravel, MD   3 mL at 04/07/17 2108  . sodium chloride flush (NS) 0.9 % injection 3 mL  3 mL Intravenous PRN Jani Gravel, MD      . triamcinolone cream (KENALOG) 0.1 % 1 application  1 application Topical BID PRN Jani Gravel, MD         Discharge Medications: Please see discharge summary for a list of discharge medications.  Relevant Imaging Results:  Relevant Lab Results:   Additional Information SS# [place], [place]

## 2017-04-08 NOTE — Clinical Social Work Note (Signed)
Clinical Social Work Assessment  Patient Details  Name: Lori [name] MRN: [id number] Date of Birth: [birth year]  Date of referral:  04/08/17               Reason for consult:  Discharge Planning, Facility Placement                Permission sought to share information with:  Guardian, Family Supports, Customer service manager Permission granted to share information::  Yes, Verbal Permission Granted  Name::     Lori [name]  Agency::  [facility]  Relationship::  daughter  Sport and exercise psychologist Information:  [phone]  Housing/Transportation Living arrangements for the past 2 months:  Assisted Living Facility([facility]) Source of Information:  Patient, Guardian Patient Interpreter Needed:  None Criminal Activity/Legal Involvement Pertinent to Current Situation/Hospitalization:  No - Comment as needed Significant Relationships:  Adult Children Lives with:  Facility Resident Do you feel safe going back to the place where you live?  Yes Need for family participation in patient care:  Yes (Comment)  Care giving concerns:  Patient is weak and has fluctuating orientation, she needs supervision and support to improve strength and mobility, SNF placement is recommended.     Social Worker assessment / plan:  CSW met with patient at bedside and discussed patient's understanding of care at hospital and transition back to [facility]. Patient states that she feels safe and is still adjusting to [facility] (she has only been there around 2 weeks), and that previously she was living with her daughter. Patient has a legal guardian, her daughter Lori [name], who is a Curator at [employer]. Patient requested CSW contact Lori [name] and speak with them both on speakerphone. CSW called daughter and spoke with her on speakerphone. Both daughter and patient again affirmed that they would like placement back at [facility] also spoke with daughter separately about patient's  fluctuating orientation and daughter requested CSW check in with with her throughout the day as preparations are made for discharge. Patient's daughter states that her mother has been more confused this hospitalization than she was previously. CSW will follow up with [facility] to check on placement availability on their Skilled Nursing unit. CSW will continue to follow and provide support as needed.   Employment status:  Retired Forensic scientist:  Medicare PT Recommendations:  [place] / Referral to community resources:  [place]  Patient/Family's Response to care:  Patient and family (Equities trader), are understanding of SNF placement recommendations and would prefer placement be selected at [facility] where patient is already in assisted living.   Patient/Family's Understanding of and Emotional Response to Diagnosis, Current Treatment, and Prognosis:  Patient has fluctuating orientation regarding her diagnosis, treatment, and prognosis. She is aware that she has been transitioned out of her daughters home and into [facility], but seems confused about transition to hospital, from [facility]. She feels safe and is okay with returning to [facility]. Patient's daughter is understanding of her mothers current diagnosis and prognosis, and believes SNF placement is a good idea, stated that she would like her to return to [facility] to continue treatment and to be around the same environment, since she was still getting settled.   Emotional Assessment Appearance:  Appears stated age Attitude/Demeanor/Rapport:  Inconsistent, Apprehensive Affect (typically observed):  Apprehensive, Quiet, Overwhelmed Orientation:  Oriented to Self, Oriented to Situation, Fluctuating Orientation (Suspected and/or reported Sundowners)  Alcohol / Substance use:   None Psych involvement (Current and /or in the community):  No  (Comment)  Discharge Needs  Concerns to be addressed:  Care Coordination, Discharge Planning Concerns Readmission within the last 30 days:  Yes Current discharge risk:  Dependent with Mobility Barriers to Discharge:  Ship broker, Continued Medical Work up   [employer], [place] 04/08/2017, 1:18 PM

## 2017-04-08 NOTE — Social Work (Addendum)
Clinical Social Worker facilitated patient discharge including contacting patient family and facility to confirm patient discharge plans.  Clinical information faxed to facility and family agreeable with plan.  Patient will be transferred by daughter to [facility] SNF. RN to call [phone] with report prior to discharge.  Clinical Social Worker will sign off for now as social work intervention is no longer needed. Please consult [place] again if new need arises.  Alexander Mt, [place] Social Worker

## 2017-04-08 NOTE — Progress Notes (Signed)
Physical Therapy Treatment Patient Details Name: Lori [name] MRN: [id number] DOB: 1919-01-16 Today's Date: 04/08/2017    History of Present Illness Pt is a 83 y.o. female with PMH consisting of HTN, CVA, macular degeneration, and mod-severe MR. She presented to the ED from [facility] ALF with c/o increase in dyspnea. Pt admitted with dx of SVT.       PT Comments    Pt in bed upon arrival. Very eager to get out of bed and walk. Pt mod I with increased time and use of bed rail to get to EOB. Min guard for safety during sit to stand transfer. Pt reported feeling dizzy once standing. Symptoms slowly decreased during seated break.BP seated 126/25mmHg (pt moving while obtaining BP) .  Pt easily distracted during ambulation. Min guard-supervision during gait. VC to redirect pt to task and awareness to obstacles when ambulating with RW down hallway. 1 person HHA when ambulating in room. Current plan remains appropriate due to pt decrease awareness to safety.  Follow Up Recommendations  SNF     Equipment Recommendations  None recommended by PT    Recommendations for Other Services       Precautions / Restrictions Precautions Precautions: Fall Restrictions Weight Bearing Restrictions: No    Mobility  Bed Mobility Overal bed mobility: Needs Assistance Bed Mobility: Supine to Sit     Supine to sit: Modified independent (Device/Increase time);HOB elevated     General bed mobility comments: once blankets were removed pt modified I for supine to sit EOB. Use of rail   Transfers Overall transfer level: Needs assistance Equipment used: None Transfers: Sit to/from Stand Sit to Stand: Min guard         General transfer comment: VC to power up. Pt reported symptoms of dizziness during initial sit to stand. Had pt sit until symptoms decreased. BP 126/73mmHg. Not certain on accuracy due to pt frequently moving while trying to obtain BP.   Ambulation/Gait Ambulation/Gait  assistance: Min guard;Supervision Ambulation Distance (Feet): 350 Feet(120ft x2) Assistive device: Rolling walker (2 wheeled) Gait Pattern/deviations: Step-through pattern;Decreased stride length Gait velocity: Decreased   General Gait Details: Pt required 1 seated rest break and 1 standing rest break due to increased fatigue during ambulation. Pt easily distracted when ambulating down hallway. VC to correct RW drifting left/right. Pt able to ambulate around room with 1 person HHA.    Stairs            Wheelchair Mobility    Modified Rankin (Stroke Patients Only)       Balance Overall balance assessment: Needs assistance Sitting-balance support: Feet supported;No upper extremity supported Sitting balance-Leahy Scale: Good     Standing balance support: Single extremity supported;During functional activity Standing balance-Leahy Scale: Poor Standing balance comment: relies on RW or HHA for balance when standing                            Cognition Arousal/Alertness: Awake/alert Behavior During Therapy: WFL for tasks assessed/performed Overall Cognitive Status: History of cognitive impairments - at baseline                                 General Comments: pt eager to participate in therapy session       Exercises General Exercises - Lower Extremity Long Arc Quad: AROM;10 reps;Both;Seated Hip Flexion/Marching: AROM;Both;10 reps;Seated Heel Raises: AROM;Both;10 reps;Seated Other Exercises Other  Exercises: towel squeeze 10 reps, 5 sec holds    General Comments        Pertinent Vitals/Pain Pain Assessment: No/denies pain    Home Living                      Prior Function            PT Goals (current goals can now be found in the care plan section) Acute Rehab PT Goals Patient Stated Goal: not discussed Progress towards PT goals: Progressing toward goals    Frequency    Min 3X/week      PT Plan Current plan  remains appropriate    Co-evaluation              AM-PAC PT "6 Clicks" Daily Activity  Outcome Measure  Difficulty turning over in bed (including adjusting bedclothes, sheets and blankets)?: A Little Difficulty moving from lying on back to sitting on the side of the bed? : A Little Difficulty sitting down on and standing up from a chair with arms (e.g., wheelchair, bedside commode, etc,.)?: Unable Help needed moving to and from a bed to chair (including a wheelchair)?: A Little Help needed walking in hospital room?: A Little Help needed climbing 3-5 steps with a railing? : A Little 6 Click Score: 16    End of Session Equipment Utilized During Treatment: Gait belt Activity Tolerance: Patient tolerated treatment well Patient left: in chair;with chair alarm set;with call bell/phone within reach Nurse Communication: Mobility status PT Visit Diagnosis: Unsteadiness on feet (R26.81);Muscle weakness (generalized) (M62.81)     Time: 1031-5945 PT Time Calculation (min) (ACUTE ONLY): 27 min  Charges:  $Gait Training: 8-22 mins $Therapeutic Activity: 8-22 mins                    G Codes:  Functional Assessment Tool Used: AM-PAC 6 Clicks Basic Mobility    Fransisca Connors, SPTA    Fransisca Connors 04/08/2017, 2:53 PM

## 2017-04-08 NOTE — Clinical Social Work Placement (Signed)
   CLINICAL SOCIAL WORK PLACEMENT  NOTE   [facility]   Date:  04/08/2017  Patient Details  Name: Lori [name] MRN: [id number] Date of Birth: [birth year]  Clinical Social Work is seeking post-discharge placement for this patient at the [place] level of care (*CSW will initial, date and re-position this form in  chart as items are completed):  Yes   Patient/family provided with [facility] Work Department's list of facilities offering this level of care within the geographic area requested by the patient (or if unable, by the patient's family).  Yes   Patient/family informed of their freedom to choose among providers that offer the needed level of care, that participate in Medicare, Medicaid or managed care program needed by the patient, have an available bed and are willing to accept the patient.  Yes   Patient/family informed of [place]'s ownership interest in [facility] and [facility], as well as of the fact that they are under no obligation to receive care at these facilities.  PASRR submitted to EDS on       PASRR number received on       Existing PASRR number confirmed on 04/08/17     FL2 transmitted to all facilities in geographic area requested by pt/family on 04/08/17     FL2 transmitted to all facilities within larger geographic area on       Patient informed that his/her managed care company has contracts with or will negotiate with certain facilities, including the following:        Yes   Patient/family informed of bed offers received.  Patient chooses bed at [facility]     Physician recommends and patient chooses bed at      Patient to be transferred to [facility] on 04/08/17.  Patient to be transferred to facility by patient daughter     Patient family notified on 04/08/17 of transfer.  Name of family member notified:  Lori [name], daughter, [phone]      PHYSICIAN       Additional Comment:    _______________________________________________ Alexander Mt, [place] 04/08/2017, 1:57 PM

## 2017-04-08 NOTE — Progress Notes (Signed)
Patient asleep and less confused and disruptive per night RN report. Sitter will be removed at this time. Patient will be closely monitored with bed in lowest position, bed alarm in place, mat at bedside. Nursing staff to round frequently.

## 2017-04-08 NOTE — Consult Note (Signed)
   [facility] Inpatient Consult   04/08/2017  Lori [name] [birth year] [id number]  Patient was assessed for multiple hospitalizations. Patient was previously active with  Patient recently at an Assisted Living at [facility], patient to discharge to a skilled facility.   No current [facility] Care Management needs noted.    Natividad Brood, RN BSN [facility] Liaison  [phone] business mobile phone Toll free office [phone]

## 2017-04-08 NOTE — Discharge Summary (Signed)
Physician Discharge Summary  Lori [name] UKG:[id number] DOB: [birth year] DOA: 04/05/2017  PCP: Lucretia Kern, DO  Admit date: 04/05/2017 Discharge date: 04/08/2017  Admitted From: ALF Disposition: SNF  Recommendations for Outpatient Follow-up:  1. Follow up with PCP in 1-2 weeks 2. Please obtain BMP/CBC in one week 3. Needs repeat TSH and free T 3 and Free T 4.  4. Follow up with cardiology for HF    Discharge Condition: stable.  CODE STATUS: DNR Diet recommendation: Heart Healthy  Brief/Interim Summary: [name] a31 y.o.female,w hypertension, cva, w mod-severe MR, Diastolic HF, apparently c/o increase in dyspnea starting at about 2 am. + palpitations and chest tightness.she was found to be in SVT, HR in the 170. She received adenosine and converted to sinus. She was having dyspnea, was place on BIPAP transiently.  Patient has been admitted for SVt and HF exacerbation. Cardiology has been consulted.    Assessment & Plan:   Principal Problem:   SVT (supraventricular tachycardia) (HCC) Active Problems:   CHF (congestive heart failure)/Unspecified   Elevated troponin  1-SVT;  Mild elevation of troponin.  Cardiology consulted. cardiology recommend amiodarone and metoprolol BID.  Received adenosine.  Started on amiodarone. Metoprolol.  No further SVT. Stable for discharge   2-Acute diastolic HF exacerbation. In setting of SVT.  Lasix change to oral. Negative 1. 3L Cardiology consulted.  Weight; 92---88---89 Stable , discharge on oral lasix.   3-Hypothyroidism; continue with Synthroid. TSH elevated.  T 4 mildly elevated. Free T 3 low. Active home is low free T 3. Would continue with current dose.   4-Mild elevation troponin; in setting SVT  5-hypokalemia; replaced   6-Confusion; suspect related to acute illness. Improved today.     Discharge Diagnoses:  Principal Problem:   SVT (supraventricular tachycardia) (HCC) Active Problems:    CHF (congestive heart failure)/Unspecified   Elevated troponin    Discharge Instructions  Discharge Instructions    Diet - low sodium heart healthy   Complete by:  As directed    Increase activity slowly   Complete by:  As directed      Allergies as of 04/08/2017      Reactions   Bactrim [sulfamethoxazole-trimethoprim] Itching   Blisters   Clindamycin/lincomycin Other (See Comments)   GI upset   Macrobid [nitrofurantoin Macrocrystal] Itching   Penicillins Itching   Has patient had a PCN reaction causing immediate rash, facial/tongue/throat swelling, SOB or lightheadedness with hypotension Unknown Has patient had a PCN reaction causing severe rash involving mucus membranes or skin necrosis: Unknown Has patient had a PCN reaction that required hospitalization: Unknown Has patient had a PCN reaction occurring within the last 10 years:Unknown If all of the above answers are "NO", then may proceed with Cephalosporin use.   Sulfa Antibiotics Itching   Zofran [ondansetron Hcl] Hives      Medication List    STOP taking these medications   carvedilol 3.125 MG tablet Commonly known as:  COREG     TAKE these medications   acetaminophen 500 MG tablet Commonly known as:  TYLENOL Take 500 mg every 6 (six) hours as needed by mouth for mild pain.   amiodarone 100 MG tablet Commonly known as:  PACERONE Take 1 tablet (100 mg total) by mouth 2 (two) times daily.   aspirin 81 MG EC tablet Take 1 tablet (81 mg total) by mouth daily. Start taking on:  04/09/2017   atorvastatin 20 MG tablet Commonly known as:  LIPITOR Take 1 tablet (20 mg total)  by mouth daily at 6 PM.   Cholecalciferol 1000 units capsule Take 1,000 Units daily by mouth.   EYE VITAMINS PO Take 1 tablet daily by mouth.   furosemide 20 MG tablet Commonly known as:  LASIX Take 20 mg by mouth daily. What changed:  Another medication with the same name was removed. Continue taking this medication, and follow the  directions you see here.   lactose free nutrition Liqd Take 237 mLs by mouth 2 (two) times daily between meals.   levothyroxine 50 MCG tablet Commonly known as:  SYNTHROID, LEVOTHROID TAKE 2 TABLETS ON MONDAY AND FRIDAY. TAKE 1 TABLET ALL OTHER DAYS   Metoprolol Tartrate 37.5 MG Tabs Take 37.5 mg by mouth 2 (two) times daily.   potassium chloride SA 20 MEQ tablet Commonly known as:  K-DUR,KLOR-CON Take 20 mEq by mouth daily.   riboflavin 100 MG Tabs tablet Commonly known as:  VITAMIN B-2 Take 100 mg by mouth daily.   triamcinolone cream 0.1 % Commonly known as:  KENALOG Apply 1 application topically 2 (two) times daily as needed (rash).       Allergies  Allergen Reactions  . Bactrim [Sulfamethoxazole-Trimethoprim] Itching    Blisters   . Clindamycin/Lincomycin Other (See Comments)    GI upset  . Macrobid [Nitrofurantoin Macrocrystal] Itching  . Penicillins Itching    Has patient had a PCN reaction causing immediate rash, facial/tongue/throat swelling, SOB or lightheadedness with hypotension Unknown Has patient had a PCN reaction causing severe rash involving mucus membranes or skin necrosis: Unknown Has patient had a PCN reaction that required hospitalization: Unknown Has patient had a PCN reaction occurring within the last 10 years:Unknown If all of the above answers are "NO", then may proceed with Cephalosporin use.   . Sulfa Antibiotics Itching  . Zofran [Ondansetron Hcl] Hives    Consultations:  Cardiology    Procedures/Studies: Dg Chest 2 View  Result Date: 03/19/2017 CLINICAL DATA:  Shortness of Breath EXAM: CHEST  2 VIEW COMPARISON:  02/27/2017 FINDINGS: Cardiac shadow remains enlarged. Vascular congestion is noted with significant interstitial edema increased from the prior exam consistent with worsening CHF. Small effusions are noted bilaterally. Some left basilar atelectasis is seen increased from the prior exam. No bony abnormality is noted.  IMPRESSION: Worsening CHF Electronically Signed   By: Inez Catalina M.D.   On: 03/19/2017 07:56   Ct Head Wo Contrast  Result Date: 03/30/2017 CLINICAL DATA:  83 year old female with head trauma. EXAM: CT HEAD WITHOUT CONTRAST TECHNIQUE: Contiguous axial images were obtained from the base of the skull through the vertex without intravenous contrast. COMPARISON:  None. FINDINGS: Brain: There is moderate age-related atrophy and chronic microvascular ischemic changes. Areas of old infarct and encephalomalacia noted involving the left frontal lobe and right occipital lobe. Smaller areas of old infarct seen in the cerebellar hemispheres. There is a 1.4 x 0.8 cm low attenuating ovoid density along the inner table of the left frontal calvarium (series 2, image 19 and coronal series 5, image 24) most consistent with a non calcified meningioma. There is no acute intracranial hemorrhage. No mass effect or midline shift. No extra-axial fluid collection. Vascular: No hyperdense vessel or unexpected calcification. Skull: Bilateral parietal burr holes noted. No acute calvarial pathology. Sinuses/Orbits: No acute finding. Other: Right posterior parietal scalp hematoma. IMPRESSION: 1. No acute intracranial hemorrhage. 2. Low attenuating density along the left frontal calvarium most consistent with noncalcified meningioma. 3. Moderate age-related atrophy and chronic microvascular ischemic changes as well as old  infarcts. Electronically Signed   By: Anner Crete M.D.   On: 03/30/2017 01:38   Dg Chest Portable 1 View  Result Date: 04/05/2017 CLINICAL DATA:  Shortness of breath EXAM: PORTABLE CHEST 1 VIEW COMPARISON:  03/19/2017 FINDINGS: Cardiac enlargement with diffuse pulmonary vascular congestion. Diffuse interstitial infiltration throughout the lungs likely due to edema. Small bilateral pleural effusions. Changes are similar to previous study and suggests congestive heart failure. No pneumothorax. Tortuous aorta.  Thoracic scoliosis convex towards the left. Degenerative changes in the shoulders. IMPRESSION: Cardiac enlargement with pulmonary vascular congestion and diffuse interstitial edema. Similar appearance to previous study. Electronically Signed   By: Lucienne Capers M.D.   On: 04/05/2017 03:44       Subjective: She is less confuse. She is waiting for her daughter. She is doing ok.   Discharge Exam: Vitals:   04/08/17 0545 04/08/17 0744  BP: 130/87 (!) 144/85  Pulse: 70 91  Resp:  17  Temp: (!) 97.5 F (36.4 C) 97.6 F (36.4 C)  SpO2: 95% 92%   Vitals:   04/08/17 0110 04/08/17 0114 04/08/17 0545 04/08/17 0744  BP: (!) 136/93  130/87 (!) 144/85  Pulse: 68  70 91  Resp: 20   17  Temp: 98.1 F (36.7 C)  (!) 97.5 F (36.4 C) 97.6 F (36.4 C)  TempSrc: Oral  Oral Axillary  SpO2:  96% 95% 92%  Weight:   40.5 kg (89 lb 4.8 oz)   Height:        General: Pt is alert, awake, not in acute distress Cardiovascular: RRR, S1/S2 +, no rubs, no gallops Respiratory: CTA bilaterally, no wheezing, no rhonchi Abdominal: Soft, NT, ND, bowel sounds + Extremities: no edema, no cyanosis    The results of significant diagnostics from this hospitalization (including imaging, microbiology, ancillary and laboratory) are listed below for reference.     Microbiology: Recent Results (from the past 240 hour(s))  Surgical PCR screen     Status: None   Collection Time: 04/05/17  9:45 AM  Result Value Ref Range Status   MRSA, PCR NEGATIVE NEGATIVE Final   Staphylococcus aureus NEGATIVE NEGATIVE Final    Comment: (NOTE) The Xpert SA Assay (FDA approved for NASAL specimens in patients 30 years of age and older), is one component of a comprehensive surveillance program. It is not intended to diagnose infection nor to guide or monitor treatment.      Labs: BNP (last 3 results) Recent Labs    02/03/17 1551 03/19/17 0802 04/05/17 0327  BNP 391.6* 635.6* 650.3*   Basic Metabolic  Panel: Recent Labs  Lab 04/01/17 1622 04/05/17 0324 04/05/17 0334 04/05/17 1205 04/06/17 0201  NA 141  --  140 137 133*  K 5.7*  --  3.9 3.4* 4.0  CL 101  --  104 101 99*  CO2 26  --   --  26 26  GLUCOSE 97  --  138* 103* 161*  BUN 22  --  21* 14 21*  CREATININE 0.76  --  0.70 0.66 0.81  CALCIUM 9.4  --   --  8.5* 8.6*  MG  --  2.0  --   --   --    Liver Function Tests: Recent Labs  Lab 04/05/17 1205  AST 62*  ALT 51  ALKPHOS 112  BILITOT 1.0  PROT 6.6  ALBUMIN 3.0*   No results for input(s): LIPASE, AMYLASE in the last 168 hours. No results for input(s): AMMONIA in the last 168 hours.  CBC: Recent Labs  Lab 04/05/17 0324 04/05/17 0334 04/06/17 0201  WBC 9.9  --  11.2*  NEUTROABS 6.9  --   --   HGB 11.7* 11.9* 11.5*  HCT 35.9* 35.0* 35.0*  MCV 99.4  --  99.2  PLT 303  --  294   Cardiac Enzymes: Recent Labs  Lab 04/05/17 0324 04/05/17 1205 04/05/17 1912 04/05/17 2151  TROPONINI 0.06* 0.08* 0.08* 0.07*   BNP: Invalid input(s): POCBNP CBG: Recent Labs  Lab 04/05/17 1210 04/08/17 0752  GLUCAP 100* 101*   D-Dimer No results for input(s): DDIMER in the last 72 hours. Hgb A1c No results for input(s): HGBA1C in the last 72 hours. Lipid Profile Recent Labs    04/06/17 0201  CHOL 148  HDL 83  LDLCALC 53  TRIG 59  CHOLHDL 1.8   Thyroid function studies Recent Labs    04/06/17 0201  T3FREE 1.8*   Anemia work up No results for input(s): VITAMINB12, FOLATE, FERRITIN, TIBC, IRON, RETICCTPCT in the last 72 hours. Urinalysis    Component Value Date/Time   BILIRUBINUR n 03/16/2013 1504   PROTEINUR n 03/16/2013 1504   UROBILINOGEN 0.2 03/16/2013 1504   NITRITE n 03/16/2013 1504   LEUKOCYTESUR large (3+) 03/16/2013 1504   Sepsis Labs Invalid input(s): PROCALCITONIN,  WBC,  LACTICIDVEN Microbiology Recent Results (from the past 240 hour(s))  Surgical PCR screen     Status: None   Collection Time: 04/05/17  9:45 AM  Result Value Ref Range  Status   MRSA, PCR NEGATIVE NEGATIVE Final   Staphylococcus aureus NEGATIVE NEGATIVE Final    Comment: (NOTE) The Xpert SA Assay (FDA approved for NASAL specimens in patients 22 years of age and older), is one component of a comprehensive surveillance program. It is not intended to diagnose infection nor to guide or monitor treatment.      Time coordinating discharge: Over 30 minutes  SIGNED:   Elmarie Shiley, MD  [facility] 04/08/2017, 1:13 PM Pager [phone]  If 7PM-7AM, please contact night-coverage [email or web address] Password [email or web address]

## 2017-04-10 ENCOUNTER — Non-Acute Institutional Stay (SKILLED_NURSING_FACILITY): Payer: Medicare Other | Admitting: Internal Medicine

## 2017-04-10 ENCOUNTER — Encounter: Payer: Self-pay | Admitting: Internal Medicine

## 2017-04-10 DIAGNOSIS — E440 Moderate protein-calorie malnutrition: Secondary | ICD-10-CM | POA: Diagnosis not present

## 2017-04-10 DIAGNOSIS — R531 Weakness: Secondary | ICD-10-CM

## 2017-04-10 DIAGNOSIS — R4189 Other symptoms and signs involving cognitive functions and awareness: Secondary | ICD-10-CM | POA: Diagnosis not present

## 2017-04-10 DIAGNOSIS — E039 Hypothyroidism, unspecified: Secondary | ICD-10-CM | POA: Diagnosis not present

## 2017-04-10 DIAGNOSIS — E871 Hypo-osmolality and hyponatremia: Secondary | ICD-10-CM | POA: Diagnosis not present

## 2017-04-10 DIAGNOSIS — D72829 Elevated white blood cell count, unspecified: Secondary | ICD-10-CM | POA: Diagnosis not present

## 2017-04-10 DIAGNOSIS — D649 Anemia, unspecified: Secondary | ICD-10-CM

## 2017-04-10 DIAGNOSIS — Z8673 Personal history of transient ischemic attack (TIA), and cerebral infarction without residual deficits: Secondary | ICD-10-CM | POA: Diagnosis not present

## 2017-04-10 DIAGNOSIS — I5040 Unspecified combined systolic (congestive) and diastolic (congestive) heart failure: Secondary | ICD-10-CM | POA: Diagnosis not present

## 2017-04-10 DIAGNOSIS — I471 Supraventricular tachycardia: Secondary | ICD-10-CM

## 2017-04-10 DIAGNOSIS — R2681 Unsteadiness on feet: Secondary | ICD-10-CM | POA: Diagnosis not present

## 2017-04-10 NOTE — Progress Notes (Signed)
Provider:  Blanchie Serve MD  Location:  [facility] Room Number: 74 Place of Service:  SNF (31)  PCP: Lori Kern, DO Patient Care Team: Lori Kern, DO as PCP - General (Family Medicine) Lori [name], DPM as Consulting Physician (Podiatry) Lori Balls, MD as Attending Physician (Orthopedic Surgery) Lori Breeding, MD as Consulting Physician (Cardiology)  Extended Emergency Contact Information Primary Emergency Contact: Lori [name] Address: [address]          [place], [place] [address] Lori [name] Phone: [phone] Mobile Phone: [phone] Relation: Daughter  Code Status: DNR  Goals of Care: Advanced Directive information Advanced Directives 04/10/2017  Does Patient Have a Medical Advance Directive? -  Type of Advance Directive Out of facility DNR (pink MOST or yellow form);Healthcare Power of Attorney  Does patient want to make changes to medical advance directive? -  Copy of [place] in Chart? No - copy requested  Would patient like information on creating a medical advance directive? -  Pre-existing out of facility DNR order (yellow form or pink MOST form) Yellow form placed in chart (order not valid for inpatient use)      Chief Complaint  Patient presents with  . New Admit To SNF    admit from [facility]    HPI: Patient is a 83 y.o. female seen today for admission visit. She was in the hospital from 04/05/17-04/08/17 with acute dyspnea and palpitation. She was noted to have SVT and acute CHF exacerbation. She received adenosine and was seen by cardiology. She was placed on amiodarone and metoprolol. She was also diuresed with lasix and was negative 1.3 liter by time of discharge. She has medical history of HTN, CVA, diastolic CHF among others. She was residing in assisted living prior to this hospitalization. She is seen in her room today. She has some confusion but is able to carry out  conversation and make her needs known.   Past Medical History:  Diagnosis Date  . Colon cancer ([place]) 2009   s/p colon resection  . Endometrial cancer ([place]) 1989   endometrial s/p hysterectomy, chemo and radiation  . Hearing loss   . History of blood transfusion   . Hypertension   . Macular degeneration   . Migraines   . Pneumonia   . Positive TB test   . Stroke [facility]) 2004   ? TIA, brief episode of speech issues - evaluate [place]   Past Surgical History:  Procedure Laterality Date  . ABDOMINAL HYSTERECTOMY  1989  . APPENDECTOMY    . COLON RESECTION  08/27/2007  . GALLBLADDER SURGERY  1970  . SUBDURAL HEMATOMA EVACUATION VIA CRANIOTOMY  2002  . TONSILLECTOMY AND ADENOIDECTOMY  1943    reports that  has never smoked. she has never used smokeless tobacco. She reports that she drinks alcohol. She reports that she does not use drugs. Social History   Socioeconomic History  . Marital status: Widowed    Spouse name: Not on file  . Number of children: 2  . Years of education: Not on file  . Highest education level: Not on file  Social Needs  . Financial resource strain: Not on file  . Food insecurity - worry: Not on file  . Food insecurity - inability: Not on file  . Transportation needs - medical: Not on file  . Transportation needs - non-medical: Not on file  Occupational History  . Occupation: retired  Tobacco Use  .  Smoking status: Never Smoker  . Smokeless tobacco: Never Used  Substance and Sexual Activity  . Alcohol use: Yes    Comment: occ glass of wine- one every 2 months   . Drug use: No  . Sexual activity: Not on file  Other Topics Concern  . 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 Arthritis Mother   . Heart  disease Father   . Hyperlipidemia Father   . Hypertension Father   . Diabetes Sister   . Crohn's disease Daughter     Health Maintenance  Topic Date Due  . DEXA SCAN  08/09/2019 (Originally 06/09/1985)  . TETANUS/TDAP  01/31/2023  . INFLUENZA VACCINE  Completed  . PNA vac Low Risk Adult  Completed    Allergies  Allergen Reactions  . Bactrim [Sulfamethoxazole-Trimethoprim] Itching    Blisters   . Clindamycin/Lincomycin Other (See Comments)    GI upset  . Macrobid [Nitrofurantoin Macrocrystal] Itching  . Penicillins Itching    Has patient had a PCN reaction causing immediate rash, facial/tongue/throat swelling, SOB or lightheadedness with hypotension Unknown Has patient had a PCN reaction causing severe rash involving mucus membranes or skin necrosis: Unknown Has patient had a PCN reaction that required hospitalization: Unknown Has patient had a PCN reaction occurring within the last 10 years:Unknown If all of the above answers are "NO", then may proceed with Cephalosporin use.   . Sulfa Antibiotics Itching  . Zofran [Ondansetron Hcl] Hives    Outpatient Encounter Medications as of 04/10/2017  Medication Sig  . acetaminophen (TYLENOL) 500 MG tablet Take 500 mg every 6 (six) hours as needed by mouth for mild pain.   Marland Kitchen amiodarone (PACERONE) 100 MG tablet Take 1 tablet (100 mg total) by mouth 2 (two) times daily.  Marland Kitchen aspirin EC 81 MG EC tablet Take 1 tablet (81 mg total) by mouth daily.  Marland Kitchen atorvastatin (LIPITOR) 20 MG tablet Take 1 tablet (20 mg total) by mouth daily at 6 PM.  . Cholecalciferol 1000 units capsule Take 1,000 Units daily by mouth.  . furosemide (LASIX) 20 MG tablet Take 20 mg by mouth daily. Hold for SBP <100  . lactose free nutrition (BOOST) LIQD Take 237 mLs by mouth 2 (two) times daily between meals.  Marland Kitchen levothyroxine (SYNTHROID, LEVOTHROID) 50 MCG tablet TAKE 2 TABLETS ON MONDAY AND FRIDAY. TAKE 1 TABLET ALL OTHER DAYS  . Metoprolol Tartrate 37.5 MG TABS Take 1  tablet by mouth 2 (two) times daily. Hold for SBP <110 or HR < 60  . Multiple Vitamins-Minerals (EYE VITAMINS PO) Take 1 tablet daily by mouth.   . potassium chloride SA (K-DUR,KLOR-CON) 20 MEQ tablet Take 20 mEq by mouth daily.  . riboflavin (VITAMIN B-2) 100 MG TABS tablet Take 100 mg by mouth daily.  Marland Kitchen triamcinolone cream (KENALOG) 0.1 % Apply 1 application topically 2 (two) times daily as needed (rash).  . [DISCONTINUED] Metoprolol Tartrate 37.5 MG TABS Take 37.5 mg by mouth 2 (two) times daily.   No facility-administered encounter medications on file as of 04/10/2017.     Review of Systems  Constitutional: Positive for fatigue. Negative for appetite change, chills  and fever.  HENT: Positive for hearing loss. Negative for congestion, ear pain, mouth sores, rhinorrhea, sore throat and trouble swallowing.   Respiratory: Positive for shortness of breath. Negative for cough.        Has dyspnea on exertion  Cardiovascular: Negative for chest pain and palpitations.  Gastrointestinal: Negative for abdominal pain, constipation, diarrhea, nausea and vomiting.       Had a bowel movement this am  Genitourinary: Negative for dysuria.  Musculoskeletal: Positive for gait problem. Negative for arthralgias and back pain.  Skin: Negative for rash.  Neurological: Positive for weakness. Negative for dizziness, tremors, numbness and headaches.  Hematological: Bruises/bleeds easily.  Psychiatric/Behavioral: Positive for confusion.    Vitals:   04/10/17 1353  BP: 108/70  Pulse: 82  Resp: 17  Temp: 98.7 F (37.1 C)  SpO2: 98%  Weight: 93 lb 6.4 oz (42.4 kg)  Height: 4\' 11"  (1.499 m)   Body mass index is 18.86 kg/m. Physical Exam  Constitutional:  Thin built, frail, elderly female in no distress   HENT:  Head: Normocephalic and atraumatic.  Mouth/Throat: Oropharynx is clear and moist. No oropharyngeal exudate.  Eyes: Conjunctivae and EOM are normal. Pupils are equal, round, and reactive to  light.  Neck: Neck supple.  Cardiovascular: Normal rate.  Murmur heard. Pulmonary/Chest: Effort normal and breath sounds normal. No respiratory distress. She has no wheezes. She has no rales.  Abdominal: Soft. Bowel sounds are normal. There is no tenderness. There is no guarding.  Musculoskeletal: She exhibits no edema.  Able to move all 4 extremities, generalized weakness, has a walker at bedside, needs assistance with transfer  Lymphadenopathy:    She has no cervical adenopathy.  Neurological: She is alert.  Oriented to person and time but not to place  Skin: Skin is warm and dry. No rash noted. She is not diaphoretic.  Psychiatric: She has a normal mood and affect.    Labs reviewed: Basic Metabolic Panel: Recent Labs    04/01/17 1622 04/05/17 0324 04/05/17 0334 04/05/17 1205 04/06/17 0201  NA 141  --  140 137 133*  K 5.7*  --  3.9 3.4* 4.0  CL 101  --  104 101 99*  CO2 26  --   --  26 26  GLUCOSE 97  --  138* 103* 161*  BUN 22  --  21* 14 21*  CREATININE 0.76  --  0.70 0.66 0.81  CALCIUM 9.4  --   --  8.5* 8.6*  MG  --  2.0  --   --   --    Liver Function Tests: Recent Labs    03/19/17 0802 04/05/17 1205  AST 44* 62*  ALT 28 51  ALKPHOS 114 112  BILITOT 0.5 1.0  PROT 6.8 6.6  ALBUMIN 3.0* 3.0*   No results for input(s): LIPASE, AMYLASE in the last 8760 hours. No results for input(s): AMMONIA in the last 8760 hours. CBC: Recent Labs    10/09/16 1340  02/03/17 1551  03/19/17 0802 04/05/17 0324 04/05/17 0334 04/06/17 0201  WBC 7.6   < > 6.0   < > 6.2 9.9  --  11.2*  NEUTROABS 5.9  --  4.4  --   --  6.9  --   --   HGB 12.5   < > 11.2*   < > 12.3 11.7* 11.9* 11.5*  HCT 38.5   < > 34.6*   < > 38.3 35.9* 35.0* 35.0*  MCV 96.0   < >  92.8   < > 97.5 99.4  --  99.2  PLT 218   < > 424*   < > 398 303  --  294   < > = values in this interval not displayed.   Cardiac Enzymes: Recent Labs    04/05/17 1205 04/05/17 1912 04/05/17 2151  TROPONINI 0.08* 0.08*  0.07*   BNP: Invalid input(s): POCBNP No results found for: HGBA1C Lab Results  Component Value Date   TSH 6.429 (H) 04/05/2017   Lab Results  Component Value Date   [id number] 941 02/19/2017   No results found for: FOLATE No results found for: IRON, TIBC, FERRITIN  Imaging and Procedures obtained prior to SNF admission: Dg Chest Portable 1 View  Result Date: 04/05/2017 CLINICAL DATA:  Shortness of breath EXAM: PORTABLE CHEST 1 VIEW COMPARISON:  03/19/2017 FINDINGS: Cardiac enlargement with diffuse pulmonary vascular congestion. Diffuse interstitial infiltration throughout the lungs likely due to edema. Small bilateral pleural effusions. Changes are similar to previous study and suggests congestive heart failure. No pneumothorax. Tortuous aorta. Thoracic scoliosis convex towards the left. Degenerative changes in the shoulders. IMPRESSION: Cardiac enlargement with pulmonary vascular congestion and diffuse interstitial edema. Similar appearance to previous study. Electronically Signed   By: Lucienne Capers M.D.   On: 04/05/2017 03:44    Assessment/Plan  Generalized weakness With her deconditioning. Will have patient work with PT/OT as tolerated to regain strength and restore function.  Fall precautions are in place.  Unsteady gait With lower extremity weakness. Will have her work with physical therapy and occupational therapy team to help with gait training and muscle strengthening exercises.fall precautions. Skin care. Encourage to be out of bed.   SVT Reverted with adenosine. Controlled HR at present. Continue amiodarone 100 mg bid and metoprolol 37.5 mg bid.   Chronic CHF Monitor weight 3 days a week. Continue metoprolol tartrate 37.5 mg bid, furosemide 20 mg daily with kcl supplement. Check bmp.   Hyponatremia With her on lasix, check bmp.  Leukocytosis Afebrile. Likely reactive. Monitor wbc and temp curve  Anemia Unspecified. Monitor h&h periodically.   Protein  calorie malnutrition Encourage po intake. Monitor weight. Continue nutritional supplement.   History of TIA C/w aspirin and statin  Hypothyroidism Lab Results  Component Value Date   TSH 6.429 (H) 04/05/2017   Check tsh and free T4 in 4 weeks. Continue levothyroxine 100 mcg on Monday and Friday and 50 mcg on other days.   Cognitive impairment Likely has vascular component with history of TIA. Her recent hospitalization could also be contributing some to delirium along with thyroid abnormality. Check MMSE  Family/ staff Communication: reviewed care plan with patient and charge nurse.    Labs/tests ordered: cbc, cmp in 1 week. TSH and free T4 in 4 weeks  Blanchie Serve, MD [facility] [address] [place], [place] [address] Cell Phone (Monday-Friday 8 am - 5 pm): [phone] On Call: [phone] and follow prompts after 5 pm and on weekends Office Phone: [phone] Office Fax: [phone]

## 2017-04-15 ENCOUNTER — Non-Acute Institutional Stay (SKILLED_NURSING_FACILITY): Payer: Medicare Other | Admitting: Family

## 2017-04-15 ENCOUNTER — Encounter: Payer: Self-pay | Admitting: Family

## 2017-04-15 DIAGNOSIS — Z7189 Other specified counseling: Secondary | ICD-10-CM | POA: Diagnosis not present

## 2017-04-15 DIAGNOSIS — E039 Hypothyroidism, unspecified: Secondary | ICD-10-CM | POA: Diagnosis not present

## 2017-04-15 LAB — CBC AND DIFFERENTIAL
HCT: 34 — AB (ref 36–46)
Hemoglobin: 11.8 — AB (ref 12.0–16.0)
Platelets: 375 (ref 150–399)

## 2017-04-15 LAB — TSH: TSH: 11.57 — AB (ref 0.41–5.90)

## 2017-04-15 LAB — BASIC METABOLIC PANEL
BUN: 24 — AB (ref 4–21)
Creatinine: 0.9 (ref 0.5–1.1)
GLUCOSE: 97
POTASSIUM: 4.7 (ref 3.4–5.3)
SODIUM: 135 — AB (ref 137–147)

## 2017-04-15 NOTE — Progress Notes (Signed)
Location:  [place] Room Number: 40 Place of Service:  SNF [phone]) Provider: Dinah Ngetich FNP-C  Lucretia Kern, DO  Patient Care Team: Lucretia Kern, DO as PCP - General (Family Medicine) Regal, Tamala Fothergill, DPM as Consulting Physician (Podiatry) Almedia Balls, MD as Attending Physician (Orthopedic Surgery) Minus Breeding, MD as Consulting Physician (Cardiology)  Extended Emergency Contact Information Primary Emergency Contact: Degrace,Catherine Address: [address] [place], [place] [address] [name] Phone: [phone] Mobile Phone: [phone] Relation: Daughter  Code Status:  DNR Goals of care: Advanced Directive information Advanced Directives 04/15/2017  Does Patient Have a Medical Advance Directive? Yes  Type of Advance Directive Out of facility DNR (pink MOST or yellow form);Healthcare Power of Attorney  Does patient want to make changes to medical advance directive? -  Copy of [place] in Chart? Yes  Would patient like information on creating a medical advance directive? -  Pre-existing out of facility DNR order (yellow form or pink MOST form) Yellow form placed in chart (order not valid for inpatient use)     Chief Complaint  Patient presents with  . Acute Visit    Goal of Care meeting and abnormal lab results    HPI:  Pt is a 83 y.o. female seen today at [facility] for an acute visit for evaluation of abnormal lab results and discuss goals of care. She is seen in her room today with daughter Quanisha Drewry and facility Social worker Derek Jack at bedside.She denies any acute issues during visit.Patient states filled out [place] with the lawyer and requested her daughter Vonnetta Akey to be her POA. She would like her daughter to make her medical decision and fill out her Medical Orders for Scope of Treatment (MOST).Patient's daughter wishes to have  patient remain as Do Not Resuscitate (DNR) in case of a cardiopulmonary arrest. She would also like additional medical intervention to include transfer to hospital if indicated but avoid intensive care. May use limited additional intervention such as medical treatment,determine use or limitation of antibiotics when infection occurs, I.V fluids for defined trial period if indicated but no feeding tube.MOST form has been filled out today and signed by daughter and facility Nurse Billy Fischer RN as witness. Goals of care and filled MOST form between 1600 -16:20 Pm. I've answered question/ concern from daughter/HCPOA to the best of my knowledge.MOST form to be reviewed annually and as needed by POA.  Patient lab results TSH 11.57 (04/15/2017).     Past Medical History:  Diagnosis Date  . Colon cancer ([place]) 2009   s/p colon resection  . Endometrial cancer ([place]) 1989   endometrial s/p hysterectomy, chemo and radiation  . Hearing loss   . History of blood transfusion   . Hypertension   . Macular degeneration   . Migraines   . Pneumonia   . Positive TB test   . Stroke [facility]) 2004   ? TIA, brief episode of speech issues - evaluate [place]   Past Surgical History:  Procedure Laterality Date  . ABDOMINAL HYSTERECTOMY  1989  . APPENDECTOMY    . COLON RESECTION  08/27/2007  . GALLBLADDER SURGERY  1970  . SUBDURAL HEMATOMA EVACUATION VIA CRANIOTOMY  2002  . TONSILLECTOMY AND ADENOIDECTOMY  1943    Allergies  Allergen Reactions  . Bactrim [Sulfamethoxazole-Trimethoprim] Itching    Blisters   .  Clindamycin/Lincomycin Other (See Comments)    GI upset  . Macrobid [Nitrofurantoin Macrocrystal] Itching  . Penicillins Itching    Has patient had a PCN reaction causing immediate rash, facial/tongue/throat swelling, SOB or lightheadedness with hypotension Unknown Has patient had a PCN reaction causing severe rash involving mucus membranes or skin necrosis: Unknown Has patient had a PCN reaction that  required hospitalization: Unknown Has patient had a PCN reaction occurring within the last 10 years:Unknown If all of the above answers are "NO", then may proceed with Cephalosporin use.   . Sulfa Antibiotics Itching  . Zofran [Ondansetron Hcl] Hives    Outpatient Encounter Medications as of 04/15/2017  Medication Sig  . acetaminophen (TYLENOL) 500 MG tablet Take 500 mg every 6 (six) hours as needed by mouth for mild pain.   Marland Kitchen amiodarone (PACERONE) 100 MG tablet Take 1 tablet (100 mg total) by mouth 2 (two) times daily.  Marland Kitchen aspirin EC 81 MG EC tablet Take 1 tablet (81 mg total) by mouth daily.  Marland Kitchen atorvastatin (LIPITOR) 20 MG tablet Take 1 tablet (20 mg total) by mouth daily at 6 PM.  . Cholecalciferol 1000 units capsule Take 1,000 Units daily by mouth.  . furosemide (LASIX) 20 MG tablet Take 20 mg by mouth daily. Hold for SBP <100  . lactose free nutrition (BOOST) LIQD Take 237 mLs by mouth 2 (two) times daily between meals.  Marland Kitchen levothyroxine (SYNTHROID, LEVOTHROID) 50 MCG tablet TAKE 2 TABLETS ON MONDAY AND FRIDAY. TAKE 1 TABLET ALL OTHER DAYS  . Metoprolol Tartrate 37.5 MG TABS Take 1 tablet by mouth 2 (two) times daily. Hold for SBP <110 or HR < 60  . Multiple Vitamins-Minerals (EYE VITAMINS PO) Take 1 tablet daily by mouth.   . potassium chloride SA (K-DUR,KLOR-CON) 20 MEQ tablet Take 20 mEq by mouth daily.  . riboflavin (VITAMIN B-2) 100 MG TABS tablet Take 100 mg by mouth daily.  . [DISCONTINUED] triamcinolone cream (KENALOG) 0.1 % Apply 1 application topically 2 (two) times daily as needed (rash).   No facility-administered encounter medications on file as of 04/15/2017.     Review of Systems  Constitutional: Negative for chills, fatigue and fever.  HENT: Negative for congestion, rhinorrhea, sinus pressure, sinus pain, sneezing, sore throat and trouble swallowing.   Eyes: Negative for discharge, redness and visual disturbance.  Respiratory: Negative for cough, chest tightness,  shortness of breath and wheezing.   Cardiovascular: Positive for leg swelling. Negative for chest pain and palpitations.  Gastrointestinal: Negative for abdominal distention, abdominal pain, constipation, diarrhea, nausea and vomiting.  Endocrine: Negative for cold intolerance, heat intolerance, polydipsia, polyphagia and polyuria.  Genitourinary: Negative for dysuria, frequency and urgency.  Musculoskeletal: Positive for gait problem.  Skin: Negative for color change, pallor and rash.  Neurological: Negative for dizziness, seizures, syncope, light-headedness and headaches.  Psychiatric/Behavioral: Negative for agitation and sleep disturbance. The patient is not nervous/anxious.     Immunization History  Administered Date(s) Administered  . Influenza, High Dose Seasonal PF 03/16/2011, 02/21/2012, 03/10/2015, 02/06/2016, 02/01/2017  . Influenza,inj,Quad PF,6+ Mos 01/30/2013, 02/05/2014  . PPD Test 03/15/2017  . Pneumococcal Conjugate-13 02/05/2014  . Pneumococcal Polysaccharide-23 05/14/2000, 01/30/2013  . Tdap 01/30/2013   Pertinent  Health Maintenance Due  Topic Date Due  . DEXA SCAN  08/09/2019 (Originally 06/09/1985)  . INFLUENZA VACCINE  Completed  . PNA vac Low Risk Adult  Completed   Fall Risk  03/15/2017 02/14/2017 03/09/2016 01/12/2016 08/09/2014  Falls in the past year? Yes Yes No No  No  Comment - - - [place]i Telephone Survey: data to providers prior to load -  Number falls in past yr: 1 1 - - -  Injury with Fall? Yes Yes - - -  Risk for fall due to : - History of fall(s) - - -  Follow up Education provided Falls prevention discussed;Education provided - - -    Vitals:   04/15/17 1251  BP: (!) 101/56  Pulse: (!) 51  Resp: (!) 22  Temp: (!) 96.8 F (36 C)  SpO2: 98%  Weight: 94 lb 12.8 oz (43 kg)  Height: 4\' 11"  (1.499 m)   Body mass index is 19.15 kg/m. Physical Exam  Constitutional:  Thin frail elderly in no acute distress   HENT:  Head: Normocephalic.  Right  Ear: External ear normal.  Left Ear: External ear normal.  Mouth/Throat: Oropharynx is clear and moist. No oropharyngeal exudate.  Eyes: Conjunctivae and EOM are normal. Pupils are equal, round, and reactive to light. Right eye exhibits no discharge. Left eye exhibits no discharge. No scleral icterus.  Eye glasses in place   Neck: Normal range of motion. No JVD present. No thyromegaly present.  Cardiovascular: Intact distal pulses. Exam reveals no gallop and no friction rub.  Murmur heard. Pulmonary/Chest: Effort normal and breath sounds normal. No respiratory distress. She has no wheezes. She has no rales.  Abdominal: Soft. Bowel sounds are normal. She exhibits no distension. There is no tenderness. There is no rebound and no guarding.  LBM 04/15/2017  Musculoskeletal: She exhibits no tenderness.  Unsteady gait uses wheelchair and FWW. Bilateral lower extremities 2+   Lymphadenopathy:    She has no cervical adenopathy.  Neurological: Coordination normal.  Alert and oriented to person and place but not time.   Skin: Skin is warm and dry. No rash noted. No erythema.  Psychiatric: She has a normal mood and affect.   Labs reviewed: Recent Labs    04/01/17 1622 04/05/17 0324 04/05/17 0334 04/05/17 1205 04/06/17 0201 04/15/17  NA 141  --  140 137 133* 135*  K 5.7*  --  3.9 3.4* 4.0 4.7  CL 101  --  104 101 99*  --   CO2 26  --   --  26 26  --   GLUCOSE 97  --  138* 103* 161*  --   BUN 22  --  21* 14 21* 24*  CREATININE 0.76  --  0.70 0.66 0.81 0.9  CALCIUM 9.4  --   --  8.5* 8.6*  --   MG  --  2.0  --   --   --   --    Recent Labs    03/19/17 0802 04/05/17 1205  AST 44* 62*  ALT 28 51  ALKPHOS 114 112  BILITOT 0.5 1.0  PROT 6.8 6.6  ALBUMIN 3.0* 3.0*   Recent Labs    10/09/16 1340  02/03/17 1551  03/19/17 0802 04/05/17 0324 04/05/17 0334 04/06/17 0201 04/15/17  WBC 7.6   < > 6.0   < > 6.2 9.9  --  11.2*  --   NEUTROABS 5.9  --  4.4  --   --  6.9  --   --   --     HGB 12.5   < > 11.2*   < > 12.3 11.7* 11.9* 11.5* 11.8*  HCT 38.5   < > 34.6*   < > 38.3 35.9* 35.0* 35.0* 34*  MCV 96.0   < > 92.8   < >  97.5 99.4  --  99.2  --   PLT 218   < > 424*   < > 398 303  --  294 375   < > = values in this interval not displayed.   Lab Results  Component Value Date   TSH 11.57 (A) 04/15/2017   No results found for: HGBA1C Lab Results  Component Value Date   CHOL 148 04/06/2017   HDL 83 04/06/2017   LDLCALC 53 04/06/2017   TRIG 59 04/06/2017   CHOLHDL 1.8 04/06/2017    Significant Diagnostic Results in last 30 days:  Dg Chest 2 View  Result Date: 03/19/2017 CLINICAL DATA:  Shortness of Breath EXAM: CHEST  2 VIEW COMPARISON:  02/27/2017 FINDINGS: Cardiac shadow remains enlarged. Vascular congestion is noted with significant interstitial edema increased from the prior exam consistent with worsening CHF. Small effusions are noted bilaterally. Some left basilar atelectasis is seen increased from the prior exam. No bony abnormality is noted. IMPRESSION: Worsening CHF Electronically Signed   By: Inez Catalina M.D.   On: 03/19/2017 07:56   Ct Head Wo Contrast  Result Date: 03/30/2017 CLINICAL DATA:  83 year old female with head trauma. EXAM: CT HEAD WITHOUT CONTRAST TECHNIQUE: Contiguous axial images were obtained from the base of the skull through the vertex without intravenous contrast. COMPARISON:  None. FINDINGS: Brain: There is moderate age-related atrophy and chronic microvascular ischemic changes. Areas of old infarct and encephalomalacia noted involving the left frontal lobe and right occipital lobe. Smaller areas of old infarct seen in the cerebellar hemispheres. There is a 1.4 x 0.8 cm low attenuating ovoid density along the inner table of the left frontal calvarium (series 2, image 19 and coronal series 5, image 24) most consistent with a non calcified meningioma. There is no acute intracranial hemorrhage. No mass effect or midline shift. No extra-axial  fluid collection. Vascular: No hyperdense vessel or unexpected calcification. Skull: Bilateral parietal burr holes noted. No acute calvarial pathology. Sinuses/Orbits: No acute finding. Other: Right posterior parietal scalp hematoma. IMPRESSION: 1. No acute intracranial hemorrhage. 2. Low attenuating density along the left frontal calvarium most consistent with noncalcified meningioma. 3. Moderate age-related atrophy and chronic microvascular ischemic changes as well as old infarcts. Electronically Signed   By: Anner Crete M.D.   On: 03/30/2017 01:38   Dg Chest Portable 1 View  Result Date: 04/05/2017 CLINICAL DATA:  Shortness of breath EXAM: PORTABLE CHEST 1 VIEW COMPARISON:  03/19/2017 FINDINGS: Cardiac enlargement with diffuse pulmonary vascular congestion. Diffuse interstitial infiltration throughout the lungs likely due to edema. Small bilateral pleural effusions. Changes are similar to previous study and suggests congestive heart failure. No pneumothorax. Tortuous aorta. Thoracic scoliosis convex towards the left. Degenerative changes in the shoulders. IMPRESSION: Cardiac enlargement with pulmonary vascular congestion and diffuse interstitial edema. Similar appearance to previous study. Electronically Signed   By: Lucienne Capers M.D.   On: 04/05/2017 03:44    Assessment/Plan 1. Hypothyroidism, adult Lab Results  Component Value Date   TSH 11.57 (A) 04/15/2017   Change Levothyroxine 50 mcg tablet to two tablets by mouth daily before breakfast. TSH level in 8 weeks   2. Counseling regarding advanced care planning and goals of care Patient states filled out [place] with the lawyer and requested her daughter Luretta Everly to be her POA. She would like her daughter to make her medical decision and fill out her Medical Orders for Scope of Treatment (MOST).Patient's daughter wishes to have patient remain as  Do Not Resuscitate (DNR) in case of a cardiopulmonary  arrest. She would also like additional medical intervention to include transfer to hospital if indicated but avoid intensive care. May use limited additional intervention such as medical treatment,determine use or limitation of antibiotics when infection occurs, I.V fluids for defined trial period if indicated but no feeding tube.MOST form has been filled out today and signed by daughter and facility Nurse Billy Fischer RN as witness. Goals of care and filled MOST form between 1600 -16:20 Pm. I've answered question/ concern from daughter/HCPOA to the best of my knowledge.MOST form to be reviewed annually and as needed by POA.   Family/ staff Communication: Reviewed plan of care with patient and facility Nurse.  Labs/tests ordered:TSH level in 8 weeks   Sandrea Hughs, NP

## 2017-04-17 ENCOUNTER — Other Ambulatory Visit: Payer: Self-pay | Admitting: Family Medicine

## 2017-04-17 ENCOUNTER — Non-Acute Institutional Stay (SKILLED_NURSING_FACILITY): Payer: Medicare Other | Admitting: Family

## 2017-04-17 DIAGNOSIS — R601 Generalized edema: Secondary | ICD-10-CM

## 2017-04-17 DIAGNOSIS — R531 Weakness: Secondary | ICD-10-CM | POA: Diagnosis not present

## 2017-04-17 DIAGNOSIS — F339 Major depressive disorder, recurrent, unspecified: Secondary | ICD-10-CM | POA: Diagnosis not present

## 2017-04-17 DIAGNOSIS — R630 Anorexia: Secondary | ICD-10-CM

## 2017-04-17 NOTE — Progress Notes (Addendum)
Location:  [facility] Room Number: 40 Place of Service:  SNF [phone]) Provider: Jamilynn Whitacre FNP-C  Lucretia Kern, DO  Patient Care Team: Lucretia Kern, DO as PCP - General (Family Medicine) Regal, Tamala Fothergill, DPM as Consulting Physician (Podiatry) Almedia Balls, MD as Attending Physician (Orthopedic Surgery) Minus Breeding, MD as Consulting Physician (Cardiology)  Extended Emergency Contact Information Primary Emergency Contact: Kosanke,Catherine Address: [address] [place], [place] [address] [name] of [place] Phone: [phone] Mobile Phone: [phone] Relation: Daughter  Code Status:  DNR Goals of care: Advanced Directive information Advanced Directives 04/15/2017  Does Patient Have a Medical Advance Directive? Yes  Type of Advance Directive Out of facility DNR (pink MOST or yellow form);Healthcare Power of Attorney  Does patient want to make changes to medical advance directive? -  Copy of [place] in Chart? Yes  Would patient like information on creating a medical advance directive? -  Pre-existing out of facility DNR order (yellow form or pink MOST form) Yellow form placed in chart (order not valid for inpatient use)     Chief Complaint  Patient presents with  . Acute Visit    depression, poor appetite,insomnia     HPI:  Pt is a 83 y.o. female seen today at [facility] for an acute visit for evaluation of poor appetite, depression and inability to sleep at night. She is seen in her room today.she complains of feeling depressed and lonely at times. She states her daughter and grand daughter comes to visit but cannot come frequent since they have to work too.she states feeling tired though has been exercising with therapy.she describes her  Appetite as not good at all and wound like to start on an appetite stimulant. She was seen by palliative care NP prior to visit.she denies any fever,chills or cough.     Addendum 04/18/2017: Patient's lab results showed elevated liver enzyme AST 50, ALT 57 with normal Total bilirubin and ALK phosphatase ( 04/18/2017).  Past Medical History:  Diagnosis Date  . Colon cancer ([facility]) 2009   s/p colon resection  . Endometrial cancer ([facility]) 1989   endometrial s/p hysterectomy, chemo and radiation  . Hearing loss   . History of blood transfusion   . Hypertension   . Macular degeneration   . Migraines   . Pneumonia   . Positive TB test   . Stroke [facility]) 2004   ? TIA, brief episode of speech issues - evaluate OH   Past Surgical History:  Procedure Laterality Date  . ABDOMINAL HYSTERECTOMY  1989  . APPENDECTOMY    . COLON RESECTION  08/27/2007  . GALLBLADDER SURGERY  1970  . SUBDURAL HEMATOMA EVACUATION VIA CRANIOTOMY  2002  . TONSILLECTOMY AND ADENOIDECTOMY  1943    Allergies  Allergen Reactions  . Bactrim [Sulfamethoxazole-Trimethoprim] Itching    Blisters   . Clindamycin/Lincomycin Other (See Comments)    GI upset  . Macrobid [Nitrofurantoin Macrocrystal] Itching  . Penicillins Itching    Has patient had a PCN reaction causing immediate rash, facial/tongue/throat swelling, SOB or lightheadedness with hypotension Unknown Has patient had a PCN reaction causing severe rash involving mucus membranes or skin necrosis: Unknown Has patient had a PCN reaction that required hospitalization: Unknown Has patient had a PCN reaction occurring within the last 10 years:Unknown If all of the above answers are "NO", then may proceed with Cephalosporin use.   Marland Kitchen  Sulfa Antibiotics Itching  . Zofran [Ondansetron Hcl] Hives    Outpatient Encounter Medications as of 04/17/2017  Medication Sig  . acetaminophen (TYLENOL) 500 MG tablet Take 500 mg every 6 (six) hours as needed by mouth for mild pain.   Marland Kitchen amiodarone (PACERONE) 100 MG tablet Take 1 tablet (100 mg total) by mouth 2 (two) times daily.  Marland Kitchen aspirin EC 81 MG EC tablet Take 1 tablet (81 mg total) by mouth  daily.  Marland Kitchen atorvastatin (LIPITOR) 20 MG tablet Take 1 tablet (20 mg total) by mouth daily at 6 PM.  . Cholecalciferol 1000 units capsule Take 1,000 Units daily by mouth.  . furosemide (LASIX) 20 MG tablet Take 20 mg by mouth daily. Hold for SBP <100  . lactose free nutrition (BOOST) LIQD Take 237 mLs by mouth 2 (two) times daily between meals.  Marland Kitchen levothyroxine (SYNTHROID, LEVOTHROID) 50 MCG tablet TAKE 2 TABLETS ON MONDAY AND FRIDAY. TAKE 1 TABLET ALL OTHER DAYS (Patient taking differently: Take 2 tablets by mouth daily before breakfast)  . Metoprolol Tartrate 37.5 MG TABS Take 1 tablet by mouth 2 (two) times daily. Hold for SBP <110 or HR < 60  . Multiple Vitamins-Minerals (EYE VITAMINS PO) Take 1 tablet daily by mouth.   . potassium chloride SA (K-DUR,KLOR-CON) 20 MEQ tablet Take 20 mEq by mouth daily.  . riboflavin (VITAMIN B-2) 100 MG TABS tablet Take 100 mg by mouth daily.   No facility-administered encounter medications on file as of 04/17/2017.     Review of Systems  Constitutional: Positive for appetite change and fatigue. Negative for activity change, chills and fever.  HENT: Negative for congestion, rhinorrhea, sinus pressure, sinus pain, sneezing and sore throat.   Eyes: Negative for discharge, redness and itching.  Respiratory: Negative for cough, chest tightness, shortness of breath and wheezing.   Cardiovascular: Positive for leg swelling. Negative for chest pain and palpitations.  Gastrointestinal: Negative for abdominal distention, abdominal pain, constipation, diarrhea, nausea and vomiting.  Endocrine: Negative for cold intolerance, heat intolerance, polydipsia, polyphagia and polyuria.  Genitourinary: Negative for dysuria, flank pain, frequency and urgency.  Musculoskeletal: Positive for gait problem.  Skin: Negative for color change, pallor and rash.  Neurological: Negative for dizziness, seizures, light-headedness, numbness and headaches.  Hematological: Does not  bruise/bleed easily.  Psychiatric/Behavioral: Positive for sleep disturbance. Negative for agitation and confusion. The patient is not nervous/anxious.        Reports feeling depressed and lonely.     Immunization History  Administered Date(s) Administered  . Influenza, High Dose Seasonal PF 03/16/2011, 02/21/2012, 03/10/2015, 02/06/2016, 02/01/2017  . Influenza,inj,Quad PF,6+ Mos 01/30/2013, 02/05/2014  . PPD Test 03/15/2017  . Pneumococcal Conjugate-13 02/05/2014  . Pneumococcal Polysaccharide-23 05/14/2000, 01/30/2013  . Tdap 01/30/2013   Pertinent  Health Maintenance Due  Topic Date Due  . DEXA SCAN  08/09/2019 (Originally 06/09/1985)  . INFLUENZA VACCINE  Completed  . PNA vac Low Risk Adult  Completed   Fall Risk  03/15/2017 02/14/2017 03/09/2016 01/12/2016 08/09/2014  Falls in the past year? Yes Yes No No No  Comment - - - [facility] Telephone Survey: data to providers prior to load -  Number falls in past yr: 1 1 - - -  Injury with Fall? Yes Yes - - -  Risk for fall due to : - History of fall(s) - - -  Follow up Education provided Falls prevention discussed;Education provided - - -    Vitals:   04/17/17 1500  BP: (!) 146/70  Pulse: 78  Resp: (!) 22  Temp: (!) 96.8 F (36 C)  SpO2: 98%  Weight: 94 lb (42.6 kg)  Height: 4' 11"  (1.499 m)   Body mass index is 18.99 kg/m. Physical Exam  Constitutional: She appears well-developed.  Frail Elderly in no acute distress   HENT:  Head: Normocephalic.  Right Ear: External ear normal.  Left Ear: External ear normal.  Mouth/Throat: Oropharynx is clear and moist. No oropharyngeal exudate.  Eyes: Conjunctivae and EOM are normal. Pupils are equal, round, and reactive to light. Right eye exhibits no discharge. Left eye exhibits no discharge. No scleral icterus.  Neck: Normal range of motion. No JVD present. No thyromegaly present.  Cardiovascular: Intact distal pulses. Exam reveals no gallop and no friction rub.  Murmur  heard. Pulmonary/Chest: Effort normal and breath sounds normal. No respiratory distress. She has no wheezes. She has no rales.  Abdominal: Soft. Bowel sounds are normal. She exhibits no distension. There is no tenderness. There is no rebound and no guarding.  Musculoskeletal: Normal range of motion. She exhibits no tenderness.  Unsteady gait uses FWW.Bilateral lower extremities 1-2+ edema.   Lymphadenopathy:    She has no cervical adenopathy.  Neurological: Coordination normal.  Alert and oriented to person and place but disoriented to time.   Skin: Skin is warm and dry. No rash noted. No erythema.  Psychiatric:  Despair    Labs reviewed: Recent Labs    04/01/17 1622 04/05/17 0324 04/05/17 0334 04/05/17 1205 04/06/17 0201 04/15/17  NA 141  --  140 137 133* 135*  K 5.7*  --  3.9 3.4* 4.0 4.7  CL 101  --  104 101 99*  --   CO2 26  --   --  26 26  --   GLUCOSE 97  --  138* 103* 161*  --   BUN 22  --  21* 14 21* 24*  CREATININE 0.76  --  0.70 0.66 0.81 0.9  CALCIUM 9.4  --   --  8.5* 8.6*  --   MG  --  2.0  --   --   --   --    Recent Labs    03/19/17 0802 04/05/17 1205  AST 44* 62*  ALT 28 51  ALKPHOS 114 112  BILITOT 0.5 1.0  PROT 6.8 6.6  ALBUMIN 3.0* 3.0*   Recent Labs    10/09/16 1340  02/03/17 1551  03/19/17 0802 04/05/17 0324 04/05/17 0334 04/06/17 0201 04/15/17  WBC 7.6   < > 6.0   < > 6.2 9.9  --  11.2*  --   NEUTROABS 5.9  --  4.4  --   --  6.9  --   --   --   HGB 12.5   < > 11.2*   < > 12.3 11.7* 11.9* 11.5* 11.8*  HCT 38.5   < > 34.6*   < > 38.3 35.9* 35.0* 35.0* 34*  MCV 96.0   < > 92.8   < > 97.5 99.4  --  99.2  --   PLT 218   < > 424*   < > 398 303  --  294 375   < > = values in this interval not displayed.   Lab Results  Component Value Date   TSH 11.57 (A) 04/15/2017   No results found for: HGBA1C Lab Results  Component Value Date   CHOL 148 04/06/2017   HDL 83 04/06/2017   LDLCALC 53 04/06/2017   TRIG  59 04/06/2017   CHOLHDL 1.8  04/06/2017    Significant Diagnostic Results in last 30 days:  Dg Chest 2 View  Result Date: 03/19/2017 CLINICAL DATA:  Shortness of Breath EXAM: CHEST  2 VIEW COMPARISON:  02/27/2017 FINDINGS: Cardiac shadow remains enlarged. Vascular congestion is noted with significant interstitial edema increased from the prior exam consistent with worsening CHF. Small effusions are noted bilaterally. Some left basilar atelectasis is seen increased from the prior exam. No bony abnormality is noted. IMPRESSION: Worsening CHF Electronically Signed   By: Inez Catalina M.D.   On: 03/19/2017 07:56   Ct Head Wo Contrast  Result Date: 03/30/2017 CLINICAL DATA:  83 year old female with head trauma. EXAM: CT HEAD WITHOUT CONTRAST TECHNIQUE: Contiguous axial images were obtained from the base of the skull through the vertex without intravenous contrast. COMPARISON:  None. FINDINGS: Brain: There is moderate age-related atrophy and chronic microvascular ischemic changes. Areas of old infarct and encephalomalacia noted involving the left frontal lobe and right occipital lobe. Smaller areas of old infarct seen in the cerebellar hemispheres. There is a 1.4 x 0.8 cm low attenuating ovoid density along the inner table of the left frontal calvarium (series 2, image 19 and coronal series 5, image 24) most consistent with a non calcified meningioma. There is no acute intracranial hemorrhage. No mass effect or midline shift. No extra-axial fluid collection. Vascular: No hyperdense vessel or unexpected calcification. Skull: Bilateral parietal burr holes noted. No acute calvarial pathology. Sinuses/Orbits: No acute finding. Other: Right posterior parietal scalp hematoma. IMPRESSION: 1. No acute intracranial hemorrhage. 2. Low attenuating density along the left frontal calvarium most consistent with noncalcified meningioma. 3. Moderate age-related atrophy and chronic microvascular ischemic changes as well as old infarcts. Electronically  Signed   By: Anner Crete M.D.   On: 03/30/2017 01:38   Dg Chest Portable 1 View  Result Date: 04/05/2017 CLINICAL DATA:  Shortness of breath EXAM: PORTABLE CHEST 1 VIEW COMPARISON:  03/19/2017 FINDINGS: Cardiac enlargement with diffuse pulmonary vascular congestion. Diffuse interstitial infiltration throughout the lungs likely due to edema. Small bilateral pleural effusions. Changes are similar to previous study and suggests congestive heart failure. No pneumothorax. Tortuous aorta. Thoracic scoliosis convex towards the left. Degenerative changes in the shoulders. IMPRESSION: Cardiac enlargement with pulmonary vascular congestion and diffuse interstitial edema. Similar appearance to previous study. Electronically Signed   By: Lucienne Capers M.D.   On: 04/05/2017 03:44   Assessment/Plan 1. Generalized edema Bilateral lower extremities 1-2 + edema.No shortness of breath or wheezing. Bilateral Lung CTA.apply bilateral knee high ted hose on in the morning and off at bedtime.   2. Depression Feelings of loneliness and despair.Spoke with Restorative therapist will follow up on one on one visit and encourage to participate in facility activities.Spoke with Palliative care NP prior to visit will have Palliative care counseling to follow up.Will start on Remeron 7.5 mg tablet daily at bedtime then titrate as needed. Continue to monitor for mood changes.   3. Decreased appetite Start Remeron 7.5 mg tablet at bedtime.Continue on protein supplements and follow up with Registered Dietician. Monitor CMP.  4. Insomnia  Possible due to depression.she will try on Remeron for now then will notify nurse if still unable to sleep. Continue to monitor.    5. Elevated liver Enzyme  AST 50, ALT 57 with normal Total bilirubin and ALK phosphatase (04/18/2017).Possible due to Statin. Lipid panel done recently during Hospital admission reviewed. Lab Results  Component Value Date   CHOL 148 04/06/2017  HDL 83  04/06/2017   LDLCALC 53 04/06/2017   TRIG 59 04/06/2017   CHOLHDL 1.8 04/06/2017   Will Reduce lipitor 20 mg tablet to 10 mg Tablet take 1/2 Tablet (17m) by mouth daily at 6 Pm. Recheck Lipid panel in 3 months. Obtain CMP in 1 week 04/25/2017.   Family/ staff Communication: Reviewed plan of care with patient and facility Nurse.   Labs/tests ordered: Recheck Lipid panel in 3 months.CMP in 1 week 04/25/2017.    DSandrea Hughs NP

## 2017-04-18 ENCOUNTER — Encounter: Payer: Self-pay | Admitting: *Deleted

## 2017-04-18 LAB — CBC AND DIFFERENTIAL
HEMATOCRIT: 36 (ref 36–46)
HEMOGLOBIN: 12.3 (ref 12.0–16.0)
Platelets: 406 — AB (ref 150–399)
WBC: 5.7

## 2017-04-18 LAB — COMPLETE METABOLIC PANEL WITH GFR
ALBUMIN: 3.4
ALT: 57
AST: 50
Alkaline Phosphatase: 115
BILIRUBIN TOTAL: 0.5
BUN: 21 (ref 4–21)
CALCIUM: 8.9
Creat: 0.81
GLUCOSE: 172
POTASSIUM: 4.7
SODIUM: 133
Total Protein: 6 g/dL

## 2017-04-18 LAB — BASIC METABOLIC PANEL
BUN: 21 (ref 4–21)
CREATININE: 0.8 (ref 0.5–1.1)
Glucose: 172
POTASSIUM: 4.7 (ref 3.4–5.3)
Sodium: 133 — AB (ref 137–147)

## 2017-04-18 LAB — CBC
HCT: 36.1
HGB: 12.3
WBC: 5.7
platelet count: 406

## 2017-04-25 LAB — BASIC METABOLIC PANEL
BUN: 27 — AB (ref 4–21)
Creatinine: 0.8 (ref 0.5–1.1)
Glucose: 89
Potassium: 4.2 (ref 3.4–5.3)
SODIUM: 138 (ref 137–147)

## 2017-04-26 ENCOUNTER — Other Ambulatory Visit: Payer: Self-pay | Admitting: *Deleted

## 2017-04-26 MED ORDER — POTASSIUM CHLORIDE CRYS ER 20 MEQ PO TBCR: 20.0000 meq | EXTENDED_RELEASE_TABLET | Freq: Every day | ORAL | 1 refills | Status: AC

## 2017-04-26 NOTE — Telephone Encounter (Signed)
Rx done. 

## 2017-05-09 LAB — TSH: TSH: 2.1 (ref 0.41–5.90)

## 2017-05-13 ENCOUNTER — Non-Acute Institutional Stay (SKILLED_NURSING_FACILITY): Payer: Medicare Other | Admitting: Family

## 2017-05-13 ENCOUNTER — Encounter: Payer: Self-pay | Admitting: Family

## 2017-05-13 DIAGNOSIS — E440 Moderate protein-calorie malnutrition: Secondary | ICD-10-CM | POA: Diagnosis not present

## 2017-05-13 DIAGNOSIS — E039 Hypothyroidism, unspecified: Secondary | ICD-10-CM | POA: Diagnosis not present

## 2017-05-13 DIAGNOSIS — R2681 Unsteadiness on feet: Secondary | ICD-10-CM

## 2017-05-13 DIAGNOSIS — I509 Heart failure, unspecified: Secondary | ICD-10-CM | POA: Diagnosis not present

## 2017-05-13 DIAGNOSIS — R531 Weakness: Secondary | ICD-10-CM | POA: Diagnosis not present

## 2017-05-13 NOTE — Progress Notes (Signed)
Location:  [facility] Room Number: 40 Place of Service:  SNF (31) Provider: Chrystian Cupples FNP-[name]   Lori Serve, MD  Patient Care Team: Lori Serve, MD as PCP - General (Internal Medicine) Regal, Lori [name], DPM as Consulting Physician (Podiatry) Lori Balls, MD as Attending Physician (Orthopedic Surgery) Lori Breeding, MD as Consulting Physician (Cardiology)  Extended Emergency Contact Information Primary Emergency Contact: Lori [name],Lori [name] Address: [address]          [place], [place] [address] [name] of [place] Phone: [phone] Mobile Phone: [phone] Relation: Daughter Secondary Emergency Contact: Lori [name] Lori [name] Mobile Phone: [phone] Relation: Grandson  Code Status:  DNR Goals of care: Advanced Directive information Advanced Directives 05/20/2017  Does Patient Have a Medical Advance Directive? Yes  Type of Paramedic of [place];Out of facility DNR (pink MOST or yellow form)  Does patient want to make changes to medical advance directive? No - Patient declined  Copy of [place] in Chart? Yes  Would patient like information on creating a medical advance directive? -  Pre-existing out of facility DNR order (yellow form or pink MOST form) Yellow form placed in chart (order not valid for inpatient use);Pink MOST form placed in chart (order not valid for inpatient use)     Chief Complaint  Patient presents with  . Medical Management of Chronic Issues    routine visit    HPI:  Pt is a 83 y.o. female seen today [facility] for medical management of chronic diseases.she has a medical history of  HTN,CHF,Hypothyroidism,Stroke/TIA, Dementia among other conditions.she is seen in her room today.she denies any acute issues during visit.she continues to work well with therapy. No recent fall episode reported.she has had progressive weight loss 04/22/2017 wt 96 lbs; 05/01/17 wt 94.6  lbs and 05/08/17 Wt 91.8 lbs.Facility Nurse reports poor appetite.      Past Medical History:  Diagnosis Date  . Colon cancer ([place]) 2009   s/p colon resection  . Endometrial cancer ([place]) 1989   endometrial s/p hysterectomy, chemo and radiation  . Hearing loss   . History of blood transfusion   . Hypertension   . Macular degeneration   . Migraines   . Pneumonia   . Positive TB test   . Stroke [facility]) 2004   ? TIA, brief episode of speech issues - evaluate OH   Past Surgical History:  Procedure Laterality Date  . ABDOMINAL HYSTERECTOMY  1989  . APPENDECTOMY    . COLON RESECTION  08/27/2007  . GALLBLADDER SURGERY  1970  . SUBDURAL HEMATOMA EVACUATION VIA CRANIOTOMY  2002  . TONSILLECTOMY AND ADENOIDECTOMY  1943    Allergies  Allergen Reactions  . Bactrim [Sulfamethoxazole-Trimethoprim] Itching    Blisters   . Clindamycin/Lincomycin Other (See Comments)    GI upset  . Macrobid [Nitrofurantoin Macrocrystal] Itching  . Penicillins Itching    Has patient had a PCN reaction causing immediate rash, facial/tongue/throat swelling, SOB or lightheadedness with hypotension Unknown Has patient had a PCN reaction causing severe rash involving mucus membranes or skin necrosis: Unknown Has patient had a PCN reaction that required hospitalization: Unknown Has patient had a PCN reaction occurring within the last 10 years:Unknown If all of the above answers are "NO", then may proceed with Cephalosporin use.   . Sulfa Antibiotics Itching  . Zofran [Ondansetron Hcl] Hives    Allergies as of 05/13/2017      Reactions   Bactrim [sulfamethoxazole-trimethoprim] Itching  Blisters   Clindamycin/lincomycin Other (See Comments)   GI upset   Macrobid [nitrofurantoin Macrocrystal] Itching   Penicillins Itching   Has patient had a PCN reaction causing immediate rash, facial/tongue/throat swelling, SOB or lightheadedness with hypotension Unknown Has patient had a PCN reaction causing severe rash  involving mucus membranes or skin necrosis: Unknown Has patient had a PCN reaction that required hospitalization: Unknown Has patient had a PCN reaction occurring within the last 10 years:Unknown If all of the above answers are "NO", then may proceed with Cephalosporin use.   Sulfa Antibiotics Itching   Zofran [ondansetron Hcl] Hives      Medication List        Accurate as of 05/13/17 11:59 PM. Always use your most recent med list.          acetaminophen 500 MG tablet Commonly known as:  TYLENOL Take 500 mg every 6 (six) hours as needed by mouth for mild pain.   amiodarone 100 MG tablet Commonly known as:  PACERONE Take 1 tablet (100 mg total) by mouth 2 (two) times daily.   aspirin 81 MG EC tablet Take 1 tablet (81 mg total) by mouth daily.   atorvastatin 10 MG tablet Commonly known as:  LIPITOR Take 5 mg by mouth every evening.   Cholecalciferol 1000 units capsule Take 1,000 Units daily by mouth.   EYE VITAMINS PO Take 1 tablet by mouth daily. PRESERVISION  AREDS2   furosemide 20 MG tablet Commonly known as:  LASIX Take 20 mg by mouth daily. Hold for SBP <100   lactose free nutrition Liqd Take 237 mLs by mouth 2 (two) times daily between meals.   levothyroxine 50 MCG tablet Commonly known as:  SYNTHROID, LEVOTHROID Take 100 mcg by mouth daily before breakfast.   Metoprolol Tartrate 37.5 MG Tabs Take 1 tablet by mouth 2 (two) times daily. Hold for SBP <110 or HR < 60   mirtazapine 7.5 MG tablet Commonly known as:  REMERON Take 7.5 mg by mouth at bedtime.   potassium chloride SA 20 MEQ tablet Commonly known as:  KLOR-CON M20 Take 1 tablet (20 mEq total) by mouth daily.   riboflavin 100 MG Tabs tablet Commonly known as:  VITAMIN B-2 Take 100 mg by mouth daily.       Review of Systems  Constitutional: Positive for appetite change. Negative for activity change, chills, fatigue and fever.       Weight loss per HPI   HENT: Negative for congestion,  rhinorrhea, sinus pressure, sinus pain, sneezing, sore throat and trouble swallowing.   Eyes: Positive for visual disturbance. Negative for discharge, redness and itching.       Wears eye glasses  Respiratory: Negative for cough, chest tightness, shortness of breath and wheezing.   Cardiovascular: Negative for chest pain, palpitations and leg swelling.  Gastrointestinal: Negative for abdominal distention, abdominal pain, constipation, diarrhea, nausea and vomiting.       LBM 05/11/17  Endocrine: Negative for cold intolerance, heat intolerance, polydipsia, polyphagia and polyuria.  Genitourinary: Negative for flank pain, frequency and urgency.  Musculoskeletal: Positive for gait problem. Negative for arthralgias and back pain.  Skin: Negative for color change, pallor, rash and wound.  Neurological: Negative for dizziness, syncope, weakness, light-headedness and headaches.  Hematological: Does not bruise/bleed easily.  Psychiatric/Behavioral: Positive for confusion. Negative for agitation, hallucinations and sleep disturbance. The patient is not nervous/anxious.     Immunization History  Administered Date(s) Administered  . Influenza, High Dose Seasonal PF 03/16/2011, 02/21/2012,  03/10/2015, 02/06/2016, 02/01/2017  . Influenza,inj,Quad PF,6+ Mos 01/30/2013, 02/05/2014  . PPD Test 03/15/2017  . Pneumococcal Conjugate-13 02/05/2014  . Pneumococcal Polysaccharide-23 05/14/2000, 01/30/2013  . Tdap 01/30/2013   Pertinent  Health Maintenance Due  Topic Date Due  . DEXA SCAN  08/09/2019 (Originally 06/09/1985)  . INFLUENZA VACCINE  Completed  . PNA vac Low Risk Adult  Completed   Fall Risk  03/15/2017 02/14/2017 03/09/2016 01/12/2016 08/09/2014  Falls in the past year? Yes Yes No No No  Comment - - - Emmi Telephone Survey: data to providers prior to load -  Number falls in past yr: 1 1 - - -  Injury with Fall? Yes Yes - - -  Risk for fall due to : - History of fall(s) - - -  Follow up  Education provided Falls prevention discussed;Education provided - - -    Vitals:   05/13/17 0956  BP: 112/74  Pulse: 66  Resp: 18  Temp: (!) 97.1 F (36.2 [name])  SpO2: 97%  Weight: 91 lb 12.8 oz (41.6 kg)  Height: 4\' 11"  (1.499 m)   Body mass index is 18.54 kg/m. Physical Exam  Constitutional:  Thin frail elderly in no acute distress   HENT:  Head: Normocephalic.  Right Ear: External ear normal.  Left Ear: External ear normal.  Mouth/Throat: Oropharynx is clear and moist. No oropharyngeal exudate.  HOH  Eyes: Conjunctivae and EOM are normal. Pupils are equal, round, and reactive to light. Right eye exhibits no discharge. Left eye exhibits no discharge. No scleral icterus.  Eye glasses in place   Neck: Normal range of motion. No JVD present. No thyromegaly present.  Cardiovascular: Intact distal pulses. Exam reveals no gallop and no friction rub.  Murmur heard. Pulmonary/Chest: Effort normal and breath sounds normal. No respiratory distress. She has no wheezes. She has no rales.  Abdominal: Soft. Bowel sounds are normal. She exhibits no distension. There is no tenderness. There is no rebound and no guarding.  Musculoskeletal: She exhibits no edema or tenderness.  Unsteady gait uses FWW.moves x 4 extremities without any difficulties.   Lymphadenopathy:    She has no cervical adenopathy.  Neurological: Coordination normal.  A &O x 2 pleasantly confused at her baseline   Skin: Skin is warm and dry. No rash noted. No erythema.  Skin intact   Psychiatric: She has a normal mood and affect. Her behavior is normal.   Labs reviewed: Recent Labs    04/05/17 0324  04/06/17 0201  04/18/17 04/25/17 05/17/17 1925 05/18/17 0341  NA  --    < > 133*   < > 133*  133 138 136 137  K  --    < > 4.0   < > 4.7  4.7 4.2 3.6 3.7  CL  --    < > 99*  --   --   --  99* 101  CO2  --    < > 26  --   --   --  29 25  GLUCOSE  --    < > 161*  --   --   --  124* 96  BUN  --    < > 21*   < > 21   21 27* 18 15  CREATININE  --    < > 0.81   < > 0.8  0.81 0.8 0.85 0.76  CALCIUM  --    < > 8.6*  --  8.9  --  8.3* 9.1  MG  2.0  --   --   --   --   --   --   --    < > = values in this interval not displayed.   Recent Labs    04/05/17 1205 04/18/17 05/17/17 1925  AST 62* 50 46*  ALT 51 57 51  ALKPHOS 112 115 243*  BILITOT 1.0 0.5 0.7  PROT 6.6 6.0 6.2*  ALBUMIN 3.0* 3.4 2.7*   Recent Labs    02/03/17 1551  04/05/17 0324  04/06/17 0201  04/18/17 05/17/17 1925 05/18/17 0341  WBC 6.0   < > 9.9  --  11.2*  --  5.7  5.7 8.7 8.9  NEUTROABS 4.4  --  6.9  --   --   --   --  7.0  --   HGB 11.2*   < > 11.7*   < > 11.5*   < > 12.3  12.3 12.0 13.3  HCT 34.6*   < > 35.9*   < > 35.0*   < > 36  36.1 36.8 40.5  MCV 92.8   < > 99.4  --  99.2  --   --  100.8* 101.3*  PLT 424*   < > 303  --  294   < > 406* 343 365   < > = values in this interval not displayed.   Lab Results  Component Value Date   TSH 2.10 05/09/2017   No results found for: HGBA1C Lab Results  Component Value Date   CHOL 148 04/06/2017   HDL 83 04/06/2017   LDLCALC 53 04/06/2017   TRIG 59 04/06/2017   CHOLHDL 1.8 04/06/2017    Significant Diagnostic Results in last 30 days:  None  Assessment/Plan 1. Congestive heart failure, unspecified HF chronicity No weight gain and exam findings negative for cough,SOB or wheezing.continue on Furosemide 20 mg tablet daily and Metoprolol 37.5 mg tablet twice daily.monitor weight.    2. Hypothyroidism Lab Results  Component Value Date   TSH 2.10 05/09/2017  Continue on levothyroxine 100 mcg tablet daily.monitor TSH level.   3. Unsteady gait Continue to be high risk for falls.continue on therapy.Fall and safety precautions.  4. Moderate protein-calorie malnutrition  Has had progressive weight loss and poor appetite reported. weight loss:  04/22/2017 wt 96 lbs 05/01/17 wt 94.6 lbs  05/08/17 Wt 91.8 lbs Continue to follow up with Registered Dietician. Continue on  Protein supplements and Remeron 7.5 mg tablet daily at bedtime.Monitor CMP.    Family/ staff Communication: Reviewed plan of care with patient and facility Nurse.    Labs/tests ordered: None   Lori [name] Ezrah Dembeck, NP

## 2017-05-17 ENCOUNTER — Observation Stay (HOSPITAL_COMMUNITY)
Admission: EM | Admit: 2017-05-17 | Discharge: 2017-05-18 | Disposition: A | Discharge status: Home / Self Care | Payer: Medicare Other | Attending: Family Medicine | Admitting: Family Medicine

## 2017-05-17 ENCOUNTER — Encounter: Payer: Self-pay | Admitting: Family

## 2017-05-17 ENCOUNTER — Emergency Department (HOSPITAL_COMMUNITY): Payer: Medicare Other

## 2017-05-17 ENCOUNTER — Non-Acute Institutional Stay (SKILLED_NURSING_FACILITY): Payer: Medicare Other | Admitting: Family

## 2017-05-17 DIAGNOSIS — S065X9A Traumatic subdural hemorrhage with loss of consciousness of unspecified duration, initial encounter: Secondary | ICD-10-CM | POA: Diagnosis present

## 2017-05-17 DIAGNOSIS — Z85038 Personal history of other malignant neoplasm of large intestine: Secondary | ICD-10-CM | POA: Insufficient documentation

## 2017-05-17 DIAGNOSIS — Z7982 Long term (current) use of aspirin: Secondary | ICD-10-CM | POA: Insufficient documentation

## 2017-05-17 DIAGNOSIS — I10 Essential (primary) hypertension: Secondary | ICD-10-CM | POA: Diagnosis not present

## 2017-05-17 DIAGNOSIS — E039 Hypothyroidism, unspecified: Secondary | ICD-10-CM | POA: Insufficient documentation

## 2017-05-17 DIAGNOSIS — R29898 Other symptoms and signs involving the musculoskeletal system: Secondary | ICD-10-CM

## 2017-05-17 DIAGNOSIS — I6200 Nontraumatic subdural hemorrhage, unspecified: Principal | ICD-10-CM | POA: Insufficient documentation

## 2017-05-17 DIAGNOSIS — I110 Hypertensive heart disease with heart failure: Secondary | ICD-10-CM | POA: Insufficient documentation

## 2017-05-17 DIAGNOSIS — J90 Pleural effusion, not elsewhere classified: Secondary | ICD-10-CM | POA: Diagnosis not present

## 2017-05-17 DIAGNOSIS — R410 Disorientation, unspecified: Secondary | ICD-10-CM

## 2017-05-17 DIAGNOSIS — R634 Abnormal weight loss: Secondary | ICD-10-CM

## 2017-05-17 DIAGNOSIS — R4781 Slurred speech: Secondary | ICD-10-CM | POA: Diagnosis not present

## 2017-05-17 DIAGNOSIS — I340 Nonrheumatic mitral (valve) insufficiency: Secondary | ICD-10-CM | POA: Diagnosis present

## 2017-05-17 DIAGNOSIS — R4189 Other symptoms and signs involving cognitive functions and awareness: Secondary | ICD-10-CM

## 2017-05-17 DIAGNOSIS — Z8679 Personal history of other diseases of the circulatory system: Secondary | ICD-10-CM | POA: Diagnosis not present

## 2017-05-17 DIAGNOSIS — R2681 Unsteadiness on feet: Secondary | ICD-10-CM | POA: Diagnosis present

## 2017-05-17 DIAGNOSIS — Z79899 Other long term (current) drug therapy: Secondary | ICD-10-CM | POA: Insufficient documentation

## 2017-05-17 DIAGNOSIS — Z8542 Personal history of malignant neoplasm of other parts of uterus: Secondary | ICD-10-CM | POA: Insufficient documentation

## 2017-05-17 DIAGNOSIS — R479 Unspecified speech disturbances: Secondary | ICD-10-CM | POA: Diagnosis not present

## 2017-05-17 DIAGNOSIS — Z8673 Personal history of transient ischemic attack (TIA), and cerebral infarction without residual deficits: Secondary | ICD-10-CM | POA: Insufficient documentation

## 2017-05-17 DIAGNOSIS — I509 Heart failure, unspecified: Secondary | ICD-10-CM

## 2017-05-17 DIAGNOSIS — S065XAA Traumatic subdural hemorrhage with loss of consciousness status unknown, initial encounter: Secondary | ICD-10-CM | POA: Diagnosis present

## 2017-05-17 LAB — COMPREHENSIVE METABOLIC PANEL
ALBUMIN: 2.7 g/dL — AB (ref 3.5–5.0)
ALT: 51 U/L (ref 14–54)
ANION GAP: 8 (ref 5–15)
AST: 46 U/L — ABNORMAL HIGH (ref 15–41)
Alkaline Phosphatase: 243 U/L — ABNORMAL HIGH (ref 38–126)
BILIRUBIN TOTAL: 0.7 mg/dL (ref 0.3–1.2)
BUN: 18 mg/dL (ref 6–20)
CALCIUM: 8.3 mg/dL — AB (ref 8.9–10.3)
CO2: 29 mmol/L (ref 22–32)
Chloride: 99 mmol/L — ABNORMAL LOW (ref 101–111)
Creatinine, Ser: 0.85 mg/dL (ref 0.44–1.00)
GFR calc non Af Amer: 56 mL/min — ABNORMAL LOW (ref >60)
GLUCOSE: 124 mg/dL — AB (ref 65–99)
POTASSIUM: 3.6 mmol/L (ref 3.5–5.1)
Sodium: 136 mmol/L (ref 135–145)
Total Protein: 6.2 g/dL — ABNORMAL LOW (ref 6.5–8.1)

## 2017-05-17 LAB — CBC WITH DIFFERENTIAL/PLATELET
BASOS ABS: 0 10*3/uL (ref 0.0–0.1)
BASOS PCT: 0 %
Eosinophils Absolute: 0.1 10*3/uL (ref 0.0–0.7)
Eosinophils Relative: 1 %
HCT: 36.8 % (ref 36.0–46.0)
HEMOGLOBIN: 12 g/dL (ref 12.0–15.0)
LYMPHS PCT: 9 %
Lymphs Abs: 0.8 10*3/uL (ref 0.7–4.0)
MCH: 32.9 pg (ref 26.0–34.0)
MCHC: 32.6 g/dL (ref 30.0–36.0)
MCV: 100.8 fL — AB (ref 78.0–100.0)
Monocytes Absolute: 0.8 10*3/uL (ref 0.1–1.0)
Monocytes Relative: 9 %
NEUTROS ABS: 7 10*3/uL (ref 1.7–7.7)
NEUTROS PCT: 81 %
Platelets: 343 10*3/uL (ref 150–400)
RBC: 3.65 MIL/uL — AB (ref 3.87–5.11)
RDW: 13.8 % (ref 11.5–15.5)
WBC: 8.7 10*3/uL (ref 4.0–10.5)

## 2017-05-17 LAB — URINALYSIS, ROUTINE W REFLEX MICROSCOPIC
Bilirubin Urine: NEGATIVE
Glucose, UA: NEGATIVE mg/dL
Hgb urine dipstick: NEGATIVE
KETONES UR: NEGATIVE mg/dL
LEUKOCYTES UA: NEGATIVE
NITRITE: NEGATIVE
Protein, ur: NEGATIVE mg/dL
SPECIFIC GRAVITY, URINE: 1.009 (ref 1.005–1.030)
pH: 5 (ref 5.0–8.0)

## 2017-05-17 LAB — I-STAT CG4 LACTIC ACID, ED: Lactic Acid, Venous: 0.93 mmol/L (ref 0.5–1.9)

## 2017-05-17 LAB — I-STAT TROPONIN, ED: Troponin i, poc: 0.05 ng/mL (ref 0.00–0.08)

## 2017-05-17 MED ORDER — AMIODARONE HCL 200 MG PO TABS
100.0000 mg | ORAL_TABLET | Freq: Two times a day (BID) | ORAL | Status: DC
  Administered 2017-05-18 (×2): 100 mg via ORAL
  Filled 2017-05-17 (×2): qty 1

## 2017-05-17 MED ORDER — FUROSEMIDE 20 MG PO TABS
20.0000 mg | ORAL_TABLET | Freq: Every day | ORAL | Status: DC
  Administered 2017-05-18: 20 mg via ORAL
  Filled 2017-05-17: qty 1

## 2017-05-17 MED ORDER — METOPROLOL TARTRATE 25 MG PO TABS
37.5000 mg | ORAL_TABLET | Freq: Two times a day (BID) | ORAL | Status: DC
  Administered 2017-05-18 (×2): 37.5 mg via ORAL
  Filled 2017-05-17 (×2): qty 2

## 2017-05-17 MED ORDER — SODIUM CHLORIDE 0.9% FLUSH
3.0000 mL | Freq: Two times a day (BID) | INTRAVENOUS | Status: DC
  Administered 2017-05-18: 3 mL via INTRAVENOUS

## 2017-05-17 MED ORDER — SODIUM CHLORIDE 0.9 % IV SOLN: 250.0000 mL | INTRAVENOUS | Status: DC | PRN

## 2017-05-17 MED ORDER — ATORVASTATIN CALCIUM 10 MG PO TABS
5.0000 mg | ORAL_TABLET | Freq: Every evening | ORAL | Status: DC
  Administered 2017-05-18: 5 mg via ORAL
  Filled 2017-05-17: qty 1

## 2017-05-17 MED ORDER — SODIUM CHLORIDE 0.9% FLUSH: 3.0000 mL | INTRAVENOUS | Status: DC | PRN

## 2017-05-17 MED ORDER — MIRTAZAPINE 15 MG PO TABS
7.5000 mg | ORAL_TABLET | Freq: Every day | ORAL | Status: DC
  Administered 2017-05-18: 7.5 mg via ORAL
  Filled 2017-05-17 (×2): qty 1

## 2017-05-17 NOTE — ED Notes (Signed)
Attempted PIVx 3; no success. Second RN to attempt.

## 2017-05-17 NOTE — H&P (Signed)
History and Physical    Lori [name] IRJ:[id number] DOB: [birth year] DOA: 05/17/2017  PCP: Lori [name]  Patient coming from:  SNF  Chief Complaint:  confused  HPI: Lori [name] is [name] 84 y.o. female with medical history significant of freq falls, cognitive impairment, HTN lives in snf still ambulating with walker comes in with family for concerns of acting strange and with impaired speech.  No recent illness.  No witnessed falls.  Pt is pleasantly confused and denies any pain.  There is no evidence of recent trauma to her head.  No bruising.  Pt referred for admission for finding of subdural hematoma . Neurosurgery called by EDP Lori [name] who recommended obs pt overnight.  Review of Systems: As per HPI otherwise 10 point review of systems negative per grandson  Past Medical History:  Diagnosis Date  . Colon cancer ([place]) 2009   s/p colon resection  . Endometrial cancer ([place]) 1989   endometrial s/p hysterectomy, chemo and radiation  . Hearing loss   . History of blood transfusion   . Hypertension   . Macular degeneration   . Migraines   . Pneumonia   . Positive TB test   . Stroke [facility]) 2004   ? TIA, brief episode of speech issues - evaluate OH    Past Surgical History:  Procedure Laterality Date  . ABDOMINAL HYSTERECTOMY  1989  . APPENDECTOMY    . COLON RESECTION  08/27/2007  . GALLBLADDER SURGERY  1970  . SUBDURAL HEMATOMA EVACUATION VIA CRANIOTOMY  2002  . TONSILLECTOMY AND ADENOIDECTOMY  1943     reports that  has never smoked. she has never used smokeless tobacco. She reports that she drinks alcohol. She reports that she does not use drugs.  Allergies  Allergen Reactions  . Bactrim [Sulfamethoxazole-Trimethoprim] Itching    Blisters   . Clindamycin/Lincomycin Other (See Comments)    GI upset  . Macrobid [Nitrofurantoin Macrocrystal] Itching  . Penicillins Itching    Has patient had [name] PCN reaction causing immediate rash, facial/tongue/throat swelling, SOB  or lightheadedness with hypotension Unknown Has patient had [name] PCN reaction causing severe rash involving mucus membranes or skin necrosis: Unknown Has patient had [name] PCN reaction that required hospitalization: Unknown Has patient had [name] PCN reaction occurring within the last 10 years:Unknown If all of the above answers are "NO", then may proceed with Cephalosporin use.   . Sulfa Antibiotics Itching  . Zofran [Ondansetron Hcl] Hives    Family History  Problem Relation Age of Onset  . Arthritis Mother   . Heart disease Father   . Hyperlipidemia Father   . Hypertension Father   . Diabetes Sister   . Crohn's disease Daughter     Prior to Admission medications   Medication Sig Start Date End Date Taking? Authorizing Provider  acetaminophen (TYLENOL) 500 MG tablet Take 500 mg every 6 (six) hours as needed by mouth for mild pain.    Yes Provider, Historical, [name]  amiodarone (PACERONE) 100 MG tablet Take 1 tablet (100 mg total) by mouth 2 (two) times daily. 04/08/17  Yes Lori [name], Lori [name], [name]  aspirin EC 81 MG EC tablet Take 1 tablet (81 mg total) by mouth daily. 04/09/17  Yes Lori [name], Lori [name], [name]  atorvastatin (LIPITOR) 10 MG tablet Take 5 mg by mouth every evening.    Yes Provider, Historical, [name]  Cholecalciferol 1000 units capsule Take 1,000 Units daily by mouth.   Yes Provider, Historical, [name]  furosemide (LASIX) 20  MG tablet Take 20 mg by mouth daily. Hold for SBP <100   Yes Provider, Historical, [name]  lactose free nutrition (BOOST) LIQD Take 237 mLs by mouth 2 (two) times daily between meals.   Yes Provider, Historical, [name]  levothyroxine (SYNTHROID, LEVOTHROID) 50 MCG tablet Take 100 mcg by mouth daily before breakfast.   Yes Provider, Historical, [name]  Metoprolol Tartrate 37.5 MG TABS Take 1 tablet by mouth 2 (two) times daily. Hold for SBP <110 or HR < 60   Yes Provider, Historical, [name]  mirtazapine (REMERON) 7.5 MG tablet Take 7.5 mg by mouth at bedtime.   Yes Provider, Historical, [name]    Multiple Vitamins-Minerals (EYE VITAMINS PO) Take 1 tablet by mouth daily. PRESERVISION  AREDS2   Yes Provider, Historical, [name]  potassium chloride SA (KLOR-CON M20) 20 MEQ tablet Take 1 tablet (20 mEq total) by mouth daily. 04/26/17  Yes Lori Lori R, DO  riboflavin (VITAMIN B-2) 100 MG TABS tablet Take 100 mg by mouth daily.   Yes Provider, Historical, [name]    Physical Exam: Vitals:   05/17/17 1830 05/17/17 1850 05/17/17 1850 05/17/17 1915  BP: (!) 148/70 137/62  (!) 158/86  Pulse: 73 68  64  Resp: (!) 28 (!) 28  20  Temp:  99.5 F (37.5 C) 99.5 F (37.5 C)   TempSrc:  Rectal Rectal   SpO2: 97% 100%  98%      Constitutional: NAD, calm, comfortable, pleasantly confused Vitals:   05/17/17 1830 05/17/17 1850 05/17/17 1850 05/17/17 1915  BP: (!) 148/70 137/62  (!) 158/86  Pulse: 73 68  64  Resp: (!) 28 (!) 28  20  Temp:  99.5 F (37.5 C) 99.5 F (37.5 C)   TempSrc:  Rectal Rectal   SpO2: 97% 100%  98%   Eyes: PERRL, lids and conjunctivae normal ENMT: Mucous membranes are moist. Posterior pharynx clear of any exudate or lesions.Normal dentition.  Neck: normal, supple, no masses, no thyromegaly Respiratory: clear to auscultation bilaterally, no wheezing, no crackles. Normal respiratory effort. No accessory muscle use.  Cardiovascular: Regular rate and rhythm, no murmurs / rubs / gallops. No extremity edema. 2+ pedal pulses. No carotid bruits.  Abdomen: no tenderness, no masses palpated. No hepatosplenomegaly. Bowel sounds positive.  Musculoskeletal: no clubbing / cyanosis. No joint deformity upper and lower extremities. Good ROM, no contractures. Normal muscle tone.  Skin: no rashes, lesions, ulcers. No induration Neurologic: CN 2-12 grossly intact. Sensation intact, DTR normal. Strength 5/5 in all 4.  Psychiatric: alert and oriented to name only  Labs on Admission: I have personally reviewed following labs and imaging studies  CBC: Recent Labs  Lab 05/17/17 1925  WBC 8.7   NEUTROABS 7.0  HGB 12.0  HCT 36.8  MCV 100.8*  PLT 109   Basic Metabolic Panel: Recent Labs  Lab 05/17/17 1925  NA 136  K 3.6  CL 99*  CO2 29  GLUCOSE 124*  BUN 18  CREATININE 0.85  CALCIUM 8.3*   GFR: Estimated Creatinine Clearance: 24.9 mL/min (by C-G formula based on SCr of 0.85 mg/dL). Liver Function Tests: Recent Labs  Lab 05/17/17 1925  AST 46*  ALT 51  ALKPHOS 243*  BILITOT 0.7  PROT 6.2*  ALBUMIN 2.7*   No results for input(s): LIPASE, AMYLASE in the last 168 hours. No results for input(s): AMMONIA in the last 168 hours. Coagulation Profile: No results for input(s): INR, PROTIME in the last 168 hours. Cardiac Enzymes: No results for input(s): CKTOTAL, CKMB,  CKMBINDEX, TROPONINI in the last 168 hours. BNP (last 3 results) No results for input(s): PROBNP in the last 8760 hours. HbA1C: No results for input(s): HGBA1C in the last 72 hours. CBG: No results for input(s): GLUCAP in the last 168 hours. Lipid Profile: No results for input(s): CHOL, HDL, LDLCALC, TRIG, CHOLHDL, LDLDIRECT in the last 72 hours. Thyroid Function Tests: No results for input(s): TSH, T4TOTAL, FREET4, T3FREE, THYROIDAB in the last 72 hours. Anemia Panel: No results for input(s): VITAMINB12, FOLATE, FERRITIN, TIBC, IRON, RETICCTPCT in the last 72 hours. Urine analysis:    Component Value Date/Time   COLORURINE YELLOW 05/17/2017 1915   APPEARANCEUR HAZY ([name]) 05/17/2017 1915   LABSPEC 1.009 05/17/2017 1915   PHURINE 5.0 05/17/2017 1915   GLUCOSEU NEGATIVE 05/17/2017 1915   HGBUR NEGATIVE 05/17/2017 1915   BILIRUBINUR NEGATIVE 05/17/2017 1915   BILIRUBINUR n 03/16/2013 1504   KETONESUR NEGATIVE 05/17/2017 1915   PROTEINUR NEGATIVE 05/17/2017 1915   UROBILINOGEN 0.2 03/16/2013 1504   NITRITE NEGATIVE 05/17/2017 1915   LEUKOCYTESUR NEGATIVE 05/17/2017 1915   Sepsis Labs: !!!!!!!!!!!!!!!!!!!!!!!!!!!!!!!!!!!!!!!!!!!! @LABRCNTIP (procalcitonin:4,lacticidven:4) )No results found for  this or any previous visit (from the past 240 hour(s)).   Radiological Exams on Admission: Dg Chest 2 View  Result Date: 05/17/2017 CLINICAL DATA:  Altered mental status EXAM: CHEST  2 VIEW COMPARISON:  CXR 04/05/2017, CT 02/03/2017 FINDINGS: Re- demonstration of cardiomegaly, pulmonary vascular engorgement and interstitial edema. This appears somewhat more accentuated than on prior exam. Slight increase in bilateral small pleural effusions. No acute osseous abnormality. Scoliotic curvature of the lumbar spine convex to the right. Osteoarthritis of both glenohumeral joints with inferomedial spurring off the humeral heads. Thoracic kyphosis attributable to chronic multilevel thoracic compression fractures similar to prior CT. IMPRESSION: 1. Re- demonstration of diffuse interstitial edema with small bilateral pleural effusions. Stable cardiomegaly with aortic atherosclerosis. 2. Stable thoracic compression fractures. Electronically Signed   By: Ashley Royalty M.D.   On: 05/17/2017 20:06   Ct Head Wo Contrast  Result Date: 05/17/2017 CLINICAL DATA:  Altered mental status.  Difficulty finding words. EXAM: CT HEAD WITHOUT CONTRAST TECHNIQUE: Contiguous axial images were obtained from the base of the skull through the vertex without intravenous contrast. COMPARISON:  03/30/2017 FINDINGS: Brain: There is [name] heterogeneous density subdural hematoma along the right cerebral convexity measuring up to 18 mm in thickness. There is mild mass effect on the atrophic right hemisphere. Chronic small vessel ischemia with gliosis in the cerebral white matter. Remote small to moderate infarcts in the left frontal and right occipital lobes. Small remote upper right cerebellar infarct. Small remote left inferior occipital infarct. Stable soft tissue density mass along the left frontal convexity consistent with meningioma. The mass measures 17 x 6 mm and does not contact the brain. Vascular: Atherosclerotic calcification. Skull: Remote  biparietal burr holes. Sinuses/Orbits: No evidence of injury. Other: Critical Value/emergent results were called by telephone at the time of interpretation on 05/17/2017 at 7:28 pm to Dr. Carmon Sails , who verbally acknowledged these results. IMPRESSION: 1. Mixed density subdural hematoma along the right cerebral convexity measuring up to 18 mm in thickness. Mass effect on the atrophic brain is mild. 2. Remote infarcts as described. 3. Stable 17 x 6 mm left frontal extra-axial mass, probable meningioma. Electronically Signed   By: Monte Fantasia M.D.   On: 05/17/2017 19:32    Case discussed with P [name] in ed  Assessment/Plan 84 yo female with likely recent fall with subdural hematoma  Principal Problem:  Subdural hematoma ([place])- obs on med.  freq neuro cks.  Family does not want aggressive intervention.  Neurosurgery consulted.    Active Problems:   Essential hypertension, benign- stable cont home meds   CHF (congestive heart failure)/Unspecified- stable and compensated at this time   Moderate to severe mitral regurgitation- stable   History of PAT (paroxysmal atrial tachycardia)- stable   Unsteady gait- noted   Cognitive impairment- noted    DVT prophylaxis:  scds Code Status:  DNR , confirmed with family Family Communication:  Grandson, daughter is coming in the am Disposition Plan:  Per day team Consults called:  Neurosurgery  Admission status:  observation   Maziah Smola [name] [name]  If 7PM-7AM, please contact night-coverage [email or web address] Password [facility]  05/17/2017, 9:16 PM

## 2017-05-17 NOTE — ED Notes (Signed)
ED Provider at bedside. 

## 2017-05-17 NOTE — ED Triage Notes (Signed)
Pt BIB EMS from [facility] for AMS. Per EMS pt grandson noticed pt having difficulty finding her words, LSN sometime yesterday. Pt oriented to self; disoriented to place, time, and situation. No facial droop or arm weakness. Pt words mumbling and slurred sometimes during assessment. Resp e/u; NAD.

## 2017-05-17 NOTE — ED Notes (Signed)
Patient transported to CT 

## 2017-05-17 NOTE — Consult Note (Signed)
Reason for Consult:Subdural hematoma, right Referring Physician: [place], Lori Lori [name] is an 84 y.o. female.  HPI: whom was noted to be slurring her words earlier today by the nurse practitioner. She was sent to [facility] for further evaluation. She is following commmands currently, and moving all extremities. A head CT revealed a right sided mixed density subdural hematoma. I was consulted for treatment recommendations.  Past Medical History:  Diagnosis Date  . Colon cancer ([place]) 2009   s/p colon resection  . Endometrial cancer ([place]) 1989   endometrial s/p hysterectomy, chemo and radiation  . Hearing loss   . History of blood transfusion   . Hypertension   . Macular degeneration   . Migraines   . Pneumonia   . Positive TB test   . Stroke Lori Lori [name]) 2004   ? TIA, brief episode of speech issues - evaluate OH    Past Surgical History:  Procedure Laterality Date  . ABDOMINAL HYSTERECTOMY  1989  . APPENDECTOMY    . COLON RESECTION  08/27/2007  . GALLBLADDER SURGERY  1970  . SUBDURAL HEMATOMA EVACUATION VIA CRANIOTOMY  2002  . TONSILLECTOMY AND ADENOIDECTOMY  1943    Family History  Problem Relation Age of Onset  . Arthritis Mother   . Heart disease Father   . Hyperlipidemia Father   . Hypertension Father   . Diabetes Sister   . Crohn's disease Daughter     Social History:  reports that  has never smoked. she has never used smokeless tobacco. She reports that she drinks alcohol. She reports that she does not use drugs.  Allergies:  Allergies  Allergen Reactions  . Bactrim [Sulfamethoxazole-Trimethoprim] Itching    Blisters   . Clindamycin/Lincomycin Other (See Comments)    GI upset  . Macrobid [Nitrofurantoin Macrocrystal] Itching  . Penicillins Itching    Has patient had a PCN reaction causing immediate rash, facial/tongue/throat swelling, SOB or lightheadedness with hypotension Unknown Has patient had a PCN reaction causing severe rash involving mucus membranes or  skin necrosis: Unknown Has patient had a PCN reaction that required hospitalization: Unknown Has patient had a PCN reaction occurring within the last 10 years:Unknown If all of the above answers are "NO", then may proceed with Cephalosporin use.   . Sulfa Antibiotics Itching  . Zofran [Ondansetron Hcl] Hives    Medications: I have reviewed the patient's current medications.  Results for orders placed or performed during the Lori [name] encounter of 05/17/17 (from the past 48 hour(s))  Urinalysis, Routine w reflex microscopic     Status: Abnormal   Collection Time: 05/17/17  7:15 PM  Result Value Ref Range   Color, Urine YELLOW YELLOW   APPearance HAZY (A) CLEAR   Specific Gravity, Urine 1.009 1.005 - 1.030   pH 5.0 5.0 - 8.0   Glucose, UA NEGATIVE NEGATIVE mg/dL   Hgb urine dipstick NEGATIVE NEGATIVE   Bilirubin Urine NEGATIVE NEGATIVE   Ketones, ur NEGATIVE NEGATIVE mg/dL   Protein, ur NEGATIVE NEGATIVE mg/dL   Nitrite NEGATIVE NEGATIVE   Leukocytes, UA NEGATIVE NEGATIVE  I-stat troponin, ED (not at Lori Lori [name], Lori Lori [name])     Status: None   Collection Time: 05/17/17  7:33 PM  Result Value Ref Range   Troponin i, poc 0.05 0.00 - 0.08 ng/mL   Comment 3            Comment: Due to the release kinetics of cTnI, a negative result within the first hours of the onset of symptoms does  not rule out myocardial infarction with certainty. If myocardial infarction is still suspected, repeat the test at appropriate intervals.   I-Stat CG4 Lactic Acid, ED  (not at  Lori Lori [name])     Status: None   Collection Time: 05/17/17  7:36 PM  Result Value Ref Range   Lactic Acid, Venous 0.93 0.5 - 1.9 mmol/Lori [name]    Dg Chest 2 View  Result Date: 05/17/2017 CLINICAL DATA:  Altered mental status EXAM: CHEST  2 VIEW COMPARISON:  CXR 04/05/2017, CT 02/03/2017 FINDINGS: Re- demonstration of cardiomegaly, pulmonary vascular engorgement and interstitial edema. This appears somewhat more accentuated than on prior exam. Slight  increase in bilateral small pleural effusions. No acute osseous abnormality. Scoliotic curvature of the lumbar spine convex to the right. Osteoarthritis of both glenohumeral joints with inferomedial spurring off the humeral heads. Thoracic kyphosis attributable to chronic multilevel thoracic compression fractures similar to prior CT. IMPRESSION: 1. Re- demonstration of diffuse interstitial edema with small bilateral pleural effusions. Stable cardiomegaly with aortic atherosclerosis. 2. Stable thoracic compression fractures. Electronically Signed   By: Lori Lori [name] M.D.   On: 05/17/2017 20:06   Ct Head Wo Contrast  Result Date: 05/17/2017 CLINICAL DATA:  Altered mental status.  Difficulty finding words. EXAM: CT HEAD WITHOUT CONTRAST TECHNIQUE: Contiguous axial images were obtained from the base of the skull through the vertex without intravenous contrast. COMPARISON:  03/30/2017 FINDINGS: Brain: There is a heterogeneous density subdural hematoma along the right cerebral convexity measuring up to 18 mm in thickness. There is mild mass effect on the atrophic right hemisphere. Chronic small vessel ischemia with gliosis in the cerebral white matter. Remote small to moderate infarcts in the left frontal and right occipital lobes. Small remote upper right cerebellar infarct. Small remote left inferior occipital infarct. Stable soft tissue density mass along the left frontal convexity consistent with meningioma. The mass measures 17 x 6 mm and does not contact the brain. Vascular: Atherosclerotic calcification. Skull: Remote biparietal burr holes. Sinuses/Orbits: No evidence of injury. Other: Critical Value/emergent results were called by telephone at the time of interpretation on 05/17/2017 at 7:28 pm to Dr. Carmon Sails , who verbally acknowledged these results. IMPRESSION: 1. Mixed density subdural hematoma along the right cerebral convexity measuring up to 18 mm in thickness. Mass effect on the atrophic brain is  mild. 2. Remote infarcts as described. 3. Stable 17 x 6 mm left frontal extra-axial mass, probable meningioma. Electronically Signed   By: Monte Fantasia M.D.   On: 05/17/2017 19:32    Review of Systems  Constitutional: Negative.   HENT: Negative.   Eyes: Negative.   Respiratory: Negative.   Cardiovascular: Negative.   Gastrointestinal: Negative.   Genitourinary: Negative.   Musculoskeletal: Negative.   Skin: Negative.   Neurological: Negative.   Endo/Heme/Allergies: Negative.   Psychiatric/Behavioral: Negative.    Blood pressure (!) 158/86, pulse 64, temperature 99.5 F (37.5 C), temperature source Rectal, resp. rate 20, SpO2 98 %. Physical Exam  Constitutional: She is oriented to person, place, and time. She appears well-developed and well-nourished. No distress.  HENT:  Head: Normocephalic and atraumatic.  Right Ear: External ear normal.  Left Ear: External ear normal.  Eyes: Conjunctivae and EOM are normal. Pupils are equal, round, and reactive to light.  Neck: Normal range of motion. Neck supple.  Respiratory: Effort normal and breath sounds normal.  GI: Soft. Bowel sounds are normal.  Musculoskeletal: Normal range of motion.  Neurological: She is alert and oriented to person, place, and time.  She has normal strength. She displays abnormal reflex. No cranial nerve deficit or sensory deficit. She exhibits normal muscle tone. Coordination normal.  Gait not assessed No drift Moving all extremities  Skin: Skin is warm and dry.  Psychiatric: She has a normal mood and affect. Judgment and thought content normal.    Assessment/Plan: 84 yo woman presents with a subacute/acute subdural hematoma. Due to expected atrophy the subdural is causing very little mass effect. Her exam is such that there is absolutely no role for surgery. Also and more importantly, her family does not wish for surgical intervention. I have spoken with her daughter and grandson, have explained my rationale.  They understand and are in agreement with her going [facility] tonight.  Lori Lori [name] 05/17/2017, 8:19 PM

## 2017-05-17 NOTE — ED Notes (Signed)
Attempted to call report

## 2017-05-17 NOTE — Progress Notes (Addendum)
Location:  [facility] Room Number: 40 Place of Service:  SNF [phone]) Provider: Dinah Ngetich FNP-C  Blanchie Serve, MD  Patient Care Team: Blanchie Serve, MD as PCP - General (Internal Medicine) Regal, Tamala Fothergill, DPM as Consulting Physician (Podiatry) Almedia Balls, MD as Attending Physician (Orthopedic Surgery) Minus Breeding, MD as Consulting Physician (Cardiology)  Extended Emergency Contact Information Primary Emergency Contact: [name],Lori Address: [address]          [name], [place] [address] Lori [name] [place] Phone: [phone] Mobile Phone: [phone] Relation: Daughter  Code Status:  DNR Goals of care: Advanced Directive information Advanced Directives 05/17/2017  Does Patient Have a Medical Advance Directive? Yes  Type of Paramedic of [place];Out of facility DNR (pink MOST or yellow form)  Does patient want to make changes to medical advance directive? No - Patient declined  Copy of [place] in Chart? Yes  Would patient like information on creating a medical advance directive? -  Pre-existing out of facility DNR order (yellow form or pink MOST form) Yellow form placed in chart (order not valid for inpatient use)     Chief Complaint  Patient presents with  . Acute Visit    Trouble speaking clearly and worsening grip strength    HPI:  Pt is a 84 y.o. female seen today at [facility] for an acute visit for evaluation of impaired speech,increased confusion and right hand grip weakness. She is seen in her room today per facility Nurse request. Nurse reports patient was working with therapy when they noticed patient had impaired speech and more confused.Patient denies any acute issues though gets frustrated with herself since she is unable to get her words out.She is able to follow directions but answers some questions incorrect.Nurse reports patient had clear speech early this  morning and was seen walking on facility hallways with her walker.She denies any fall episode,fever,chills or cough.Recommended sending patient to hospital for evaluation of possible CVA/TIA symptoms. Patient's POA also notified by facility Nurse and agrees with plan. EMS called by Nurse but upon arrival patient seen walking on hallway refused to be send to hospital. Slurred speech has improved but not resolved.she continues to be confused and wandering into other residence room.Dr. Bubba Camp in the facility and was notified of patient's refusal to be send to hospital for evaluation of slurred speech and increased confusion.Will monitor for now.    Past Medical History:  Diagnosis Date  . Colon cancer ([place]) 2009   s/p colon resection  . Endometrial cancer ([place]) 1989   endometrial s/p hysterectomy, chemo and radiation  . Hearing loss   . History of blood transfusion   . Hypertension   . Macular degeneration   . Migraines   . Pneumonia   . Positive TB test   . Stroke [facility]) 2004   ? TIA, brief episode of speech issues - evaluate OH   Past Surgical History:  Procedure Laterality Date  . ABDOMINAL HYSTERECTOMY  1989  . APPENDECTOMY    . COLON RESECTION  08/27/2007  . GALLBLADDER SURGERY  1970  . SUBDURAL HEMATOMA EVACUATION VIA CRANIOTOMY  2002  . TONSILLECTOMY AND ADENOIDECTOMY  1943    Allergies  Allergen Reactions  . Bactrim [Sulfamethoxazole-Trimethoprim] Itching    Blisters   . Clindamycin/Lincomycin Other (See Comments)    GI upset  . Macrobid [Nitrofurantoin Macrocrystal] Itching  . Penicillins Itching    Has patient had a PCN reaction causing  immediate rash, facial/tongue/throat swelling, SOB or lightheadedness with hypotension Unknown Has patient had a PCN reaction causing severe rash involving mucus membranes or skin necrosis: Unknown Has patient had a PCN reaction that required hospitalization: Unknown Has patient had a PCN reaction occurring within the last 10  years:Unknown If all of the above answers are "NO", then may proceed with Cephalosporin use.   . Sulfa Antibiotics Itching  . Zofran [Ondansetron Hcl] Hives    Outpatient Encounter Medications as of 05/17/2017  Medication Sig  . acetaminophen (TYLENOL) 500 MG tablet Take 500 mg every 6 (six) hours as needed by mouth for mild pain.   Marland Kitchen amiodarone (PACERONE) 100 MG tablet Take 1 tablet (100 mg total) by mouth 2 (two) times daily.  Marland Kitchen aspirin EC 81 MG EC tablet Take 1 tablet (81 mg total) by mouth daily.  Marland Kitchen atorvastatin (LIPITOR) 10 MG tablet Take 5 mg by mouth daily.  . Cholecalciferol 1000 units capsule Take 1,000 Units daily by mouth.  . furosemide (LASIX) 20 MG tablet Take 20 mg by mouth daily. Hold for SBP <100  . lactose free nutrition (BOOST) LIQD Take 237 mLs by mouth 2 (two) times daily between meals.  Marland Kitchen levothyroxine (SYNTHROID, LEVOTHROID) 50 MCG tablet Take 100 mcg by mouth daily before breakfast.  . Metoprolol Tartrate 37.5 MG TABS Take 1 tablet by mouth 2 (two) times daily. Hold for SBP <110 or HR < 60  . mirtazapine (REMERON) 7.5 MG tablet Take 7.5 mg by mouth at bedtime.  . Multiple Vitamins-Minerals (EYE VITAMINS PO) Take 1 tablet daily by mouth.   . potassium chloride SA (KLOR-CON M20) 20 MEQ tablet Take 1 tablet (20 mEq total) by mouth daily.  . riboflavin (VITAMIN B-2) 100 MG TABS tablet Take 100 mg by mouth daily.   No facility-administered encounter medications on file as of 05/17/2017.     Review of Systems  Constitutional: Negative for appetite change, chills, fatigue and fever.  HENT: Positive for hearing loss. Negative for congestion, rhinorrhea, sinus pressure, sinus pain, sneezing and sore throat.   Eyes: Negative for discharge, redness and itching.       Wears eye glasses   Respiratory: Negative for cough, chest tightness, shortness of breath and wheezing.   Cardiovascular: Negative for chest pain, palpitations and leg swelling.  Gastrointestinal: Negative for  abdominal distention, abdominal pain, constipation, diarrhea, nausea and vomiting.  Genitourinary: Negative for dysuria, frequency and urgency.  Musculoskeletal: Positive for gait problem.  Skin: Negative for color change, pallor and rash.  Neurological: Positive for speech difficulty and weakness. Negative for dizziness, seizures, syncope, facial asymmetry, light-headedness and headaches.  Hematological: Does not bruise/bleed easily.  Psychiatric/Behavioral: Positive for confusion. Negative for agitation and sleep disturbance. The patient is not nervous/anxious.     Immunization History  Administered Date(s) Administered  . Influenza, High Dose Seasonal PF 03/16/2011, 02/21/2012, 03/10/2015, 02/06/2016, 02/01/2017  . Influenza,inj,Quad PF,6+ Mos 01/30/2013, 02/05/2014  . PPD Test 03/15/2017  . Pneumococcal Conjugate-13 02/05/2014  . Pneumococcal Polysaccharide-23 05/14/2000, 01/30/2013  . Tdap 01/30/2013   Pertinent  Health Maintenance Due  Topic Date Due  . DEXA SCAN  08/09/2019 (Originally 06/09/1985)  . INFLUENZA VACCINE  Completed  . PNA vac Low Risk Adult  Completed   Fall Risk  03/15/2017 02/14/2017 03/09/2016 01/12/2016 08/09/2014  Falls in the past year? Yes Yes No No No  Comment - - - Emmi Telephone Survey: data to providers prior to load -  Number falls in past yr: 1 1 - - -  Injury with Fall? Yes Yes - - -  Risk for fall due to : - History of fall(s) - - -  Follow up Education provided Falls prevention discussed;Education provided - - -      Vitals:   05/17/17 1102  BP: (!) 146/80  Pulse: 88  Resp: 20  Temp: 99.3 F (37.4 C)  TempSrc: Oral  SpO2: 98%  Weight: 89 lb 12.8 oz (40.7 kg)  Height: 4\' 11"  (1.499 m)   Body mass index is 18.14 kg/m. Physical Exam  Constitutional:  Thin frail elderly in no acute distress   HENT:  Head: Normocephalic.  Mouth/Throat: Oropharynx is clear and moist. No oropharyngeal exudate.  Eyes: Conjunctivae are normal. Pupils are  equal, round, and reactive to light. Right eye exhibits no discharge. Left eye exhibits no discharge. No scleral icterus.  Unable to follow instruction to assess EOM though seems to be having focusing on providers finger during exam.   Neck: Normal range of motion. No JVD present. No thyromegaly present.  Cardiovascular: Intact distal pulses. Exam reveals no gallop and no friction rub.  Murmur heard. Pulmonary/Chest: Effort normal and breath sounds normal. No respiratory distress. She has no wheezes. She has no rales.  Abdominal: Soft. Bowel sounds are normal. She exhibits no distension. There is no tenderness. There is no rebound and no guarding.  Musculoskeletal: She exhibits no tenderness.  Unsteady gait uses FWW. RUE strength 3/5 and RUE 4/5.LE strength strong and equal.     Lymphadenopathy:    She has no cervical adenopathy.  Neurological: Coordination abnormal.  Disoriented to person,place,year and time.Speech slurred and difficulty finding words.Smile and cheek buffing symmetrical.unable to perform finger to nose touch and counts multiple fingers when two fingers are raised.    Skin: Skin is warm and dry. No rash noted. No erythema.  Psychiatric: She has a normal mood and affect.  Wandering on facility hallways.Pleasantly confused.     Labs reviewed: Recent Labs    04/01/17 1622 04/05/17 0324 04/05/17 0334 04/05/17 1205 04/06/17 0201 04/15/17 04/18/17 04/25/17  NA 141  --  140 137 133* 135* 133*  133 138  K 5.7*  --  3.9 3.4* 4.0 4.7 4.7  4.7 4.2  CL 101  --  104 101 99*  --   --   --   CO2 26  --   --  26 26  --   --   --   GLUCOSE 97  --  138* 103* 161*  --   --   --   BUN 22  --  21* 14 21* 24* 21  21 27*  CREATININE 0.76  --  0.70 0.66 0.81 0.9 0.8  0.81 0.8  CALCIUM 9.4  --   --  8.5* 8.6*  --  8.9  --   MG  --  2.0  --   --   --   --   --   --    Recent Labs    03/19/17 0802 04/05/17 1205 04/18/17  AST 44* 62* 50  ALT 28 51 57  ALKPHOS 114 112 115  BILITOT  0.5 1.0 0.5  PROT 6.8 6.6 6.0  ALBUMIN 3.0* 3.0* 3.4   Recent Labs    10/09/16 1340  02/03/17 1551  03/19/17 0802 04/05/17 0324  04/06/17 0201 04/15/17 04/18/17  WBC 7.6   < > 6.0   < > 6.2 9.9  --  11.2*  --  5.7  5.7  NEUTROABS 5.9  --  4.4  --   --  6.9  --   --   --   --   HGB 12.5   < > 11.2*   < > 12.3 11.7*   < > 11.5* 11.8* 12.3  12.3  HCT 38.5   < > 34.6*   < > 38.3 35.9*   < > 35.0* 34* 36  36.1  MCV 96.0   < > 92.8   < > 97.5 99.4  --  99.2  --   --   PLT 218   < > 424*   < > 398 303  --  294 375 406*   < > = values in this interval not displayed.   Lab Results  Component Value Date   TSH 2.10 05/09/2017   No results found for: HGBA1C Lab Results  Component Value Date   CHOL 148 04/06/2017   HDL 83 04/06/2017   LDLCALC 53 04/06/2017   TRIG 59 04/06/2017   CHOLHDL 1.8 04/06/2017    Significant Diagnostic Results in last 30 days:  No results found.  Assessment/Plan 1. Slurred speech New onset lasting but slowly improved. Recommended sending patient to hospital for evaluation of possible CVA/TIA symptoms. Patient's POA also notified by facility Nurse and agrees with plan. EMS called by Nurse but upon arrival patient seen walking on hallway refused to be send to hospital. Slurred speech has improved but not resolved.she continues to be confused and wandering into other residence room.Dr. Bubba Camp in the facility and was notified of patient's refusal to be send to hospital for evaluation of slurred speech and increased confusion.POA also updated by RN of patient's refusal to be send to hospital.Perform Neuro checks every 4 hours X 24 Hours.continue on ASA EC 81 mg tablet daily and atorvastatin 5 mg tablet daily.Speech Therapy to evaluate and treat as indicated.   2. Confusion Has had increased confusion as above.Obtain CBC/diff and CMP to rule out infectious and metabolic etiologies.Obtain MMSE to screen for dementia.continue to monitor.     3. Weight loss  Has had  a progressive weight loss. Wt 89.8 (05/15/2017) Wt 91.8 (05/08/2017) Wt 89.8 (04/29/2017) Continue on Protein supplements.continue to follow up with facility Registered Dietician.Monitor weight.   4. Weakness   Right hand grip weaker than left hand.Suspect possible TIA but patient refused to be send to hospital as above.continue with Therapy.  Family/ staff Communication: Reviewed plan of care with Dr.pandey,patient and facility Nurse.   Labs/tests ordered: CBC/diff and CMP today.   Addendum 05/17/2017:  Facility Nurse called states patient's grandson was here in the facility has noted a change in condition and increased confusion in patient.Yolanda Bonine states has spoke with patient to get evaluated in the ER and has agreed. He request another attempt to send patient  to ER.  Sandrea Hughs, NP

## 2017-05-17 NOTE — ED Provider Notes (Signed)
  Face-to-face evaluation   History: She presents for evaluation of confusion, associated with unusual speech, which were noticed today.  Time of onset is somewhat unclear but around 8 AM, with apparent normal status prior to that.  She is here with her nephew who is very familiar with her.  He last saw her yesterday and she was at her baseline, "able to talk to me."  Today he saw her ambulate in the hall easily, a couple of hours ago.  He feels that she has "word salad."  He also feels that she is somewhat confused at this time.  Physical exam: Alert elderly female.  She is cooperative.  Normal strength arms and legs bilaterally.  No facial asymmetry.  No dysarthria.  No expressive aphasia.  Mild confusion is present.  Medical screening examination/treatment/procedure(s) were conducted as a shared visit with non-physician practitioner(s) and myself.  I personally evaluated the patient during the encounter   Daleen Bo, MD 05/18/17 0020

## 2017-05-17 NOTE — ED Provider Notes (Signed)
[facility] EMERGENCY DEPARTMENT Provider Note   CSN: [id number] Arrival date & time: 05/17/17  1700  History   Chief Complaint Chief Complaint  Patient presents with  . Altered Mental Status   Level 5 caveat due to confusion and difficulty with speech  HPI  Lori [name] is a 84 y.o. female w/ h/o HTN and dementia presents to ED for evaluation of difficulty with speech and increased confusion since this morning. Time of onset unclear, but around 0800 today was noticed to be different by speech therapist at nursing facility.  Last known normal per grandson who was with her yesterday afternoon around 3pm. Pt denies headache, vision changes, CP, SOB, abdominal pain, nausea, vomiting, fevers, dysuria.     HPI  Past Medical History:  Diagnosis Date  . Colon cancer ([place]) 2009   s/p colon resection  . Endometrial cancer ([place]) 1989   endometrial s/p hysterectomy, chemo and radiation  . Hearing loss   . History of blood transfusion   . Hypertension   . Macular degeneration   . Migraines   . Pneumonia   . Positive TB test   . Stroke [facility]) 2004   ? TIA, brief episode of speech issues - evaluate OH    Patient Active Problem List   Diagnosis Date Noted  . Subdural hematoma ([place]) 05/17/2017  . Unsteady gait 04/10/2017  . Moderate protein-calorie malnutrition ([place]) 04/10/2017  . Cognitive impairment 04/10/2017  . SVT (supraventricular tachycardia) ([place]) 04/05/2017  . Elevated troponin 04/05/2017  . History of PAT (paroxysmal atrial tachycardia) 04/01/2017  . Hypertension 03/19/2017  . Hypothyroidism, adult 03/19/2017  . Acute respiratory insufficiency 03/19/2017  . Moderate to severe mitral regurgitation 02/19/2017  . Community acquired pneumonia/Rt LL 02/03/2017  . CHF (congestive heart failure)/Unspecified 02/03/2017  . Vertebral fracture, osteoporotic/T5 02/03/2017  . Sternal fracture 02/03/2017  . Hyperglycemia 10/10/2016  . Back pain 10/09/2016  .  Change in bowel habits 07/24/2013  . History of colon cancer 07/24/2013  . History of endometrial cancer 07/24/2013  . Loss of weight 07/24/2013  . Cystitis 03/16/2013  . Essential hypertension, benign 11/24/2012  . History of transient ischemic attack (TIA) 11/24/2012  . Hypothyroidism 11/24/2012    Past Surgical History:  Procedure Laterality Date  . ABDOMINAL HYSTERECTOMY  1989  . APPENDECTOMY    . COLON RESECTION  08/27/2007  . GALLBLADDER SURGERY  1970  . SUBDURAL HEMATOMA EVACUATION VIA CRANIOTOMY  2002  . TONSILLECTOMY AND ADENOIDECTOMY  1943    OB History    No data available       Home Medications    Prior to Admission medications   Medication Sig Start Date End Date Taking? Authorizing Provider  acetaminophen (TYLENOL) 500 MG tablet Take 500 mg every 6 (six) hours as needed by mouth for mild pain.    Yes [provider]  amiodarone (PACERONE) 100 MG tablet Take 1 tablet (100 mg total) by mouth 2 (two) times daily. 04/08/17  Yes Regalado, Belkys A, MD  aspirin EC 81 MG EC tablet Take 1 tablet (81 mg total) by mouth daily. 04/09/17  Yes Regalado, Belkys A, MD  atorvastatin (LIPITOR) 10 MG tablet Take 5 mg by mouth every evening.    Yes [provider]  Cholecalciferol 1000 units capsule Take 1,000 Units daily by mouth.   Yes [provider]  furosemide (LASIX) 20 MG tablet Take 20 mg by mouth daily. Hold for SBP <100   Yes [provider]  lactose  free nutrition (BOOST) LIQD Take 237 mLs by mouth 2 (two) times daily between meals.   Yes [provider]  levothyroxine (SYNTHROID, LEVOTHROID) 50 MCG tablet Take 100 mcg by mouth daily before breakfast.   Yes [provider]  Metoprolol Tartrate 37.5 MG TABS Take 1 tablet by mouth 2 (two) times daily. Hold for SBP <110 or HR < 60   Yes [provider]  mirtazapine (REMERON) 7.5 MG tablet Take 7.5 mg by mouth at bedtime.   Yes [provider]  Multiple  Vitamins-Minerals (EYE VITAMINS PO) Take 1 tablet by mouth daily. PRESERVISION  AREDS2   Yes [provider]  potassium chloride SA (KLOR-CON M20) 20 MEQ tablet Take 1 tablet (20 mEq total) by mouth daily. 04/26/17  Yes Colin Benton R, DO  riboflavin (VITAMIN B-2) 100 MG TABS tablet Take 100 mg by mouth daily.   Yes [provider]    Family History Family History  Problem Relation Age of Onset  . Arthritis Mother   . Heart disease Father   . Hyperlipidemia Father   . Hypertension Father   . Diabetes Sister   . Crohn's disease Daughter     Social History Social History   Tobacco Use  . Smoking status: Never Smoker  . Smokeless tobacco: Never Used  Substance Use Topics  . Alcohol use: Yes    Comment: occ glass of wine- one every 2 months   . Drug use: No     Allergies   Bactrim [sulfamethoxazole-trimethoprim]; Clindamycin/lincomycin; Macrobid [nitrofurantoin macrocrystal]; Penicillins; Sulfa antibiotics; and Zofran [ondansetron hcl]   Review of Systems Review of Systems  Neurological: Positive for speech difficulty.  Psychiatric/Behavioral: Positive for confusion.  All other systems reviewed and are negative.    Physical Exam Updated Vital Signs BP (!) 158/86   Pulse 64   Temp 99.5 F (37.5 C) (Rectal)   Resp 20   SpO2 98%   Physical Exam  Constitutional: She appears well-developed and well-nourished. No distress.  HENT:  Head: Normocephalic and atraumatic.  Nose: Nose normal.  Mouth/Throat: Oropharynx is clear and moist. No oropharyngeal exudate.  Eyes: Conjunctivae and EOM are normal. Pupils are equal, round, and reactive to light.  Neck: Normal range of motion. Neck supple.  Cardiovascular: Normal rate, regular rhythm, normal heart sounds and intact distal pulses.  No murmur heard. Pulmonary/Chest: Effort normal and breath sounds normal. She exhibits no tenderness.  Abdominal: Soft. Bowel sounds are normal. She exhibits no distension.  There is no tenderness.  Musculoskeletal: Normal range of motion. She exhibits no deformity.  Lymphadenopathy:    She has no cervical adenopathy.  Neurological: She is alert. No sensory deficit.  Alert to voice. Oriented to self and family member at bedside only. Speech is slow. She is confused.  Strength 5/5 with hand grip and ankle F/E.   Sensation to light touch intact in hands and feet. No pronator drift.   CN I and VIII not tested. CN II-XII grossly intact bilaterally.   Skin: Skin is warm and dry. Capillary refill takes less than 2 seconds.  Psychiatric: She has a normal mood and affect. Her behavior is normal. Judgment and thought content normal.  Nursing note and vitals reviewed.    ED Treatments / Results  Labs (all labs ordered are listed, but only abnormal results are displayed) Labs Reviewed  COMPREHENSIVE METABOLIC PANEL - Abnormal; Notable for the following components:      Result Value   Chloride 99 (*)  Glucose, Bld 124 (*)    Calcium 8.3 (*)    Total Protein 6.2 (*)    Albumin 2.7 (*)    AST 46 (*)    Alkaline Phosphatase 243 (*)    GFR calc non Af Amer 56 (*)    All other components within normal limits  CBC WITH DIFFERENTIAL/PLATELET - Abnormal; Notable for the following components:   RBC 3.65 (*)    MCV 100.8 (*)    All other components within normal limits  URINALYSIS, ROUTINE W REFLEX MICROSCOPIC - Abnormal; Notable for the following components:   APPearance HAZY (*)    All other components within normal limits  CULTURE, BLOOD (ROUTINE X 2)  CULTURE, BLOOD (ROUTINE X 2)  URINE CULTURE  I-STAT CG4 LACTIC ACID, ED  I-STAT TROPONIN, ED  I-STAT CG4 LACTIC ACID, ED    EKG  EKG Interpretation None       Radiology Dg Chest 2 View  Result Date: 05/17/2017 CLINICAL DATA:  Altered mental status EXAM: CHEST  2 VIEW COMPARISON:  CXR 04/05/2017, CT 02/03/2017 FINDINGS: Re- demonstration of cardiomegaly, pulmonary vascular engorgement and interstitial  edema. This appears somewhat more accentuated than on prior exam. Slight increase in bilateral small pleural effusions. No acute osseous abnormality. Scoliotic curvature of the lumbar spine convex to the right. Osteoarthritis of both glenohumeral joints with inferomedial spurring off the humeral heads. Thoracic kyphosis attributable to chronic multilevel thoracic compression fractures similar to prior CT. IMPRESSION: 1. Re- demonstration of diffuse interstitial edema with small bilateral pleural effusions. Stable cardiomegaly with aortic atherosclerosis. 2. Stable thoracic compression fractures. Electronically Signed   By: Ashley Royalty M.D.   On: 05/17/2017 20:06   Ct Head Wo Contrast  Result Date: 05/17/2017 CLINICAL DATA:  Altered mental status.  Difficulty finding words. EXAM: CT HEAD WITHOUT CONTRAST TECHNIQUE: Contiguous axial images were obtained from the base of the skull through the vertex without intravenous contrast. COMPARISON:  03/30/2017 FINDINGS: Brain: There is a heterogeneous density subdural hematoma along the right cerebral convexity measuring up to 18 mm in thickness. There is mild mass effect on the atrophic right hemisphere. Chronic small vessel ischemia with gliosis in the cerebral white matter. Remote small to moderate infarcts in the left frontal and right occipital lobes. Small remote upper right cerebellar infarct. Small remote left inferior occipital infarct. Stable soft tissue density mass along the left frontal convexity consistent with meningioma. The mass measures 17 x 6 mm and does not contact the brain. Vascular: Atherosclerotic calcification. Skull: Remote biparietal burr holes. Sinuses/Orbits: No evidence of injury. Other: Critical Value/emergent results were called by telephone at the time of interpretation on 05/17/2017 at 7:28 pm to Dr. Carmon Sails , who verbally acknowledged these results. IMPRESSION: 1. Mixed density subdural hematoma along the right cerebral convexity  measuring up to 18 mm in thickness. Mass effect on the atrophic brain is mild. 2. Remote infarcts as described. 3. Stable 17 x 6 mm left frontal extra-axial mass, probable meningioma. Electronically Signed   By: Monte Fantasia M.D.   On: 05/17/2017 19:32    Procedures Procedures (including critical care time)  Medications Ordered in ED Medications - No data to display   Initial Impression / Assessment and Plan / ED Course  I have reviewed the triage vital signs and the nursing notes.  Pertinent labs & imaging results that were available during my care of the patient were reviewed by me and considered in my medical decision making (see chart for details).  Clinical Course as of May 17 2117  Ludwig Clarks May 17, 2017  2595 I evaluated patient at 1530. LKN 0800 today per ED RN. Will call facility to confirm.   [CG]  1957 IMPRESSION: 1. Mixed density subdural hematoma along the right cerebral convexity measuring up to 18 mm in thickness. Mass effect on the atrophic brain is mild. 2. Remote infarcts as described. 3. Stable 17 x 6 mm left frontal extra-axial mass, probable meningioma. CT Head Wo Contrast [CG]    Clinical Course User Index [CG] Kinnie Feil, PA-C   84 yo female presents with unusual speech noticed sometime around 0800 today by speech therapist at nursing facility. I spoke to RN at nursing facility who was not sure about last seen normal time.  Yolanda Bonine was with her around 330PM yesterday and patient was at baseline.   On my initial exam pt is pleasantly confused. Speech is slowed. She is non toxic. Infectious vs CVA/TIA etiology in differential.  Grandson reports fall this past fall 2018 but non since.  Final Clinical Impressions(s) / ED Diagnoses   CT shows subdural hematoma. Lab work otherwise unremarkable. Spoke to neurosurgery who evaluated patient, not recommending any intervntions at this time, pt could be discharged from ED with close follow up in nursing facility.  Consulted hospitalist who will observe patient. Spoke to grandson who feels this is best for patient and agreeable.  Final diagnoses:  None    ED Discharge Orders    None       Arlean Hopping 05/17/17 2119    Daleen Bo, MD 05/18/17 [phone]

## 2017-05-17 NOTE — ED Notes (Signed)
Patient transported to X-ray 

## 2017-05-17 NOTE — ED Notes (Signed)
Call grandson for any info:  Lucious [place] @ [phone]

## 2017-05-17 NOTE — ED Notes (Signed)
Grandson at bedside, visiting from out of town. Per grandson, pt was having full conversations with him yesterday. Today starting around 0830, pt having episodes of confusion and difficulty finding words. EDP made aware.

## 2017-05-17 NOTE — ED Notes (Signed)
[place], [place], updated on pt admission status and care.

## 2017-05-17 NOTE — ED Notes (Signed)
Pt taken to CT.

## 2017-05-17 NOTE — ED Notes (Signed)
Olegario Shearer, LPN, at [facility], made aware by this RN that pt will be admitted.

## 2017-05-17 NOTE — ED Notes (Signed)
EDP notified about pt LSN.

## 2017-05-18 ENCOUNTER — Other Ambulatory Visit: Payer: Self-pay

## 2017-05-18 DIAGNOSIS — S065X9A Traumatic subdural hemorrhage with loss of consciousness of unspecified duration, initial encounter: Secondary | ICD-10-CM | POA: Diagnosis not present

## 2017-05-18 LAB — CBC
HEMATOCRIT: 40.5 % (ref 36.0–46.0)
Hemoglobin: 13.3 g/dL (ref 12.0–15.0)
MCH: 33.3 pg (ref 26.0–34.0)
MCHC: 32.8 g/dL (ref 30.0–36.0)
MCV: 101.3 fL — ABNORMAL HIGH (ref 78.0–100.0)
PLATELETS: 365 10*3/uL (ref 150–400)
RBC: 4 MIL/uL (ref 3.87–5.11)
RDW: 13.6 % (ref 11.5–15.5)
WBC: 8.9 10*3/uL (ref 4.0–10.5)

## 2017-05-18 LAB — BASIC METABOLIC PANEL
Anion gap: 11 (ref 5–15)
BUN: 15 mg/dL (ref 6–20)
CALCIUM: 9.1 mg/dL (ref 8.9–10.3)
CO2: 25 mmol/L (ref 22–32)
CREATININE: 0.76 mg/dL (ref 0.44–1.00)
Chloride: 101 mmol/L (ref 101–111)
GFR calc Af Amer: >60 mL/min (ref >60)
GLUCOSE: 96 mg/dL (ref 65–99)
POTASSIUM: 3.7 mmol/L (ref 3.5–5.1)
Sodium: 137 mmol/L (ref 135–145)

## 2017-05-18 NOTE — Clinical Social Work Note (Signed)
Per RNCM pt is from [facility] confirmed. CSW will complete d/c and set up transport.   [place], [place]

## 2017-05-18 NOTE — Clinical Social Work Note (Signed)
Clinical Social Worker facilitated patient discharge including contacting patient family and facility to confirm patient discharge plans.  Clinical information faxed to facility and family agreeable with plan.  CSW arranged ambulance transport via [facility] to [facility].  RN to call [phone] for report prior to discharge.  Clinical Social Worker will sign off for now as social work intervention is no longer needed. Please consult [place] again if new need arises.  [place], [place]

## 2017-05-18 NOTE — Discharge Summary (Signed)
Physician Discharge Summary  Lori [name] RWE:[id number] DOB: [birth year] DOA: 05/17/2017  PCP: Blanchie Serve, MD  Admit date: 05/17/2017 Discharge date: 05/18/2017  Time spent: 25 minutes  Recommendations for Outpatient Follow-up:  1. Discontinue ASA going-no other changes tro medications 2. Needs re-orientation going forward   Discharge Diagnoses:  Principal Problem:   Subdural hematoma (HCC) Active Problems:   Essential hypertension, benign   CHF (congestive heart failure)/Unspecified   Moderate to severe mitral regurgitation   History of PAT (paroxysmal atrial tachycardia)   Unsteady gait   Cognitive impairment   Discharge Condition: good  Diet recommendation: resume prior  Filed Weights   05/17/17 2352  Weight: 39.3 kg (86 lb 10.3 oz)    History of present illness:  84 year old female colon cancer status post resection 2009, prior subdural hematoma status post evacuation 2002, hypertension, moderate to severe MR plus diastolic heart failure EF 50% 02/04/17, hypothyroidism-recent admission 04/08/2017 SVT started on amiodarone metoprolol Admitted with confusion and impaired speech-neurosurgery consulted not recommending any surgery  She was besreved overnight without any further issue and was found to be stable We discontinued her aspirin prior to d/c  and she was stabilized to return to SNF     Discharge Exam: Vitals:   05/17/17 2352 05/18/17 0400  BP: (!) 143/59 (!) 146/73  Pulse: 66 64  Resp: 20 20  Temp: 97.8 F (36.6 C) 98.1 F (36.7 C)  SpO2: 98% 97%    General: awake alert in nad slight confusion-think she is in [place] Cardiovascular: s1 s2no m/r/g Respiratory: clear no added sound,  Neuro no focal deficit  Discharge Instructions   Discharge Instructions    Diet - low sodium heart healthy   Complete by:  As directed    Increase activity slowly   Complete by:  As directed      Allergies as of 05/18/2017      Reactions   Bactrim  [sulfamethoxazole-trimethoprim] Itching   Blisters   Clindamycin/lincomycin Other (See Comments)   GI upset   Macrobid [nitrofurantoin Macrocrystal] Itching   Penicillins Itching   Has patient had a PCN reaction causing immediate rash, facial/tongue/throat swelling, SOB or lightheadedness with hypotension Unknown Has patient had a PCN reaction causing severe rash involving mucus membranes or skin necrosis: Unknown Has patient had a PCN reaction that required hospitalization: Unknown Has patient had a PCN reaction occurring within the last 10 years:Unknown If all of the above answers are "NO", then may proceed with Cephalosporin use.   Sulfa Antibiotics Itching   Zofran [ondansetron Hcl] Hives      Medication List    STOP taking these medications   aspirin 81 MG EC tablet     TAKE these medications   acetaminophen 500 MG tablet Commonly known as:  TYLENOL Take 500 mg every 6 (six) hours as needed by mouth for mild pain.   amiodarone 100 MG tablet Commonly known as:  PACERONE Take 1 tablet (100 mg total) by mouth 2 (two) times daily.   atorvastatin 10 MG tablet Commonly known as:  LIPITOR Take 5 mg by mouth every evening.   Cholecalciferol 1000 units capsule Take 1,000 Units daily by mouth.   EYE VITAMINS PO Take 1 tablet by mouth daily. PRESERVISION  AREDS2   furosemide 20 MG tablet Commonly known as:  LASIX Take 20 mg by mouth daily. Hold for SBP <100   lactose free nutrition Liqd Take 237 mLs by mouth 2 (two) times daily between meals.   levothyroxine 50 MCG  tablet Commonly known as:  SYNTHROID, LEVOTHROID Take 100 mcg by mouth daily before breakfast.   Metoprolol Tartrate 37.5 MG Tabs Take 1 tablet by mouth 2 (two) times daily. Hold for SBP <110 or HR < 60   mirtazapine 7.5 MG tablet Commonly known as:  REMERON Take 7.5 mg by mouth at bedtime.   potassium chloride SA 20 MEQ tablet Commonly known as:  KLOR-CON M20 Take 1 tablet (20 mEq total) by mouth  daily.   riboflavin 100 MG Tabs tablet Commonly known as:  VITAMIN B-2 Take 100 mg by mouth daily.      Allergies  Allergen Reactions  . Bactrim [Sulfamethoxazole-Trimethoprim] Itching    Blisters   . Clindamycin/Lincomycin Other (See Comments)    GI upset  . Macrobid [Nitrofurantoin Macrocrystal] Itching  . Penicillins Itching    Has patient had a PCN reaction causing immediate rash, facial/tongue/throat swelling, SOB or lightheadedness with hypotension Unknown Has patient had a PCN reaction causing severe rash involving mucus membranes or skin necrosis: Unknown Has patient had a PCN reaction that required hospitalization: Unknown Has patient had a PCN reaction occurring within the last 10 years:Unknown If all of the above answers are "NO", then may proceed with Cephalosporin use.   . Sulfa Antibiotics Itching  . Zofran [Ondansetron Hcl] Hives      The results of significant diagnostics from this hospitalization (including imaging, microbiology, ancillary and laboratory) are listed below for reference.    Significant Diagnostic Studies: Dg Chest 2 View  Result Date: 05/17/2017 CLINICAL DATA:  Altered mental status EXAM: CHEST  2 VIEW COMPARISON:  CXR 04/05/2017, CT 02/03/2017 FINDINGS: Re- demonstration of cardiomegaly, pulmonary vascular engorgement and interstitial edema. This appears somewhat more accentuated than on prior exam. Slight increase in bilateral small pleural effusions. No acute osseous abnormality. Scoliotic curvature of the lumbar spine convex to the right. Osteoarthritis of both glenohumeral joints with inferomedial spurring off the humeral heads. Thoracic kyphosis attributable to chronic multilevel thoracic compression fractures similar to prior CT. IMPRESSION: 1. Re- demonstration of diffuse interstitial edema with small bilateral pleural effusions. Stable cardiomegaly with aortic atherosclerosis. 2. Stable thoracic compression fractures. Electronically Signed    By: Ashley Royalty M.D.   On: 05/17/2017 20:06   Ct Head Wo Contrast  Result Date: 05/17/2017 CLINICAL DATA:  Altered mental status.  Difficulty finding words. EXAM: CT HEAD WITHOUT CONTRAST TECHNIQUE: Contiguous axial images were obtained from the base of the skull through the vertex without intravenous contrast. COMPARISON:  03/30/2017 FINDINGS: Brain: There is a heterogeneous density subdural hematoma along the right cerebral convexity measuring up to 18 mm in thickness. There is mild mass effect on the atrophic right hemisphere. Chronic small vessel ischemia with gliosis in the cerebral white matter. Remote small to moderate infarcts in the left frontal and right occipital lobes. Small remote upper right cerebellar infarct. Small remote left inferior occipital infarct. Stable soft tissue density mass along the left frontal convexity consistent with meningioma. The mass measures 17 x 6 mm and does not contact the brain. Vascular: Atherosclerotic calcification. Skull: Remote biparietal burr holes. Sinuses/Orbits: No evidence of injury. Other: Critical Value/emergent results were called by telephone at the time of interpretation on 05/17/2017 at 7:28 pm to Dr. Carmon Sails , who verbally acknowledged these results. IMPRESSION: 1. Mixed density subdural hematoma along the right cerebral convexity measuring up to 18 mm in thickness. Mass effect on the atrophic brain is mild. 2. Remote infarcts as described. 3. Stable 17 x 6 mm  left frontal extra-axial mass, probable meningioma. Electronically Signed   By: Monte Fantasia M.D.   On: 05/17/2017 19:32    Microbiology: Recent Results (from the past 240 hour(s))  Blood Culture (routine x 2)     Status: None (Preliminary result)   Collection Time: 05/17/17  7:00 PM  Result Value Ref Range Status   Specimen Description BLOOD LEFT WRIST  Final   Special Requests   Final    BOTTLES DRAWN AEROBIC AND ANAEROBIC Blood Culture adequate volume   Culture PENDING   Incomplete   Report Status PENDING  Incomplete  Blood Culture (routine x 2)     Status: None (Preliminary result)   Collection Time: 05/17/17  7:02 PM  Result Value Ref Range Status   Specimen Description BLOOD RIGHT FOREARM  Final   Special Requests   Final    BOTTLES DRAWN AEROBIC AND ANAEROBIC Blood Culture adequate volume   Culture PENDING  Incomplete   Report Status PENDING  Incomplete     Labs: Basic Metabolic Panel: Recent Labs  Lab 05/17/17 1925 05/18/17 0341  NA 136 137  K 3.6 3.7  CL 99* 101  CO2 29 25  GLUCOSE 124* 96  BUN 18 15  CREATININE 0.85 0.76  CALCIUM 8.3* 9.1   Liver Function Tests: Recent Labs  Lab 05/17/17 1925  AST 46*  ALT 51  ALKPHOS 243*  BILITOT 0.7  PROT 6.2*  ALBUMIN 2.7*   No results for input(s): LIPASE, AMYLASE in the last 168 hours. No results for input(s): AMMONIA in the last 168 hours. CBC: Recent Labs  Lab 05/17/17 1925 05/18/17 0341  WBC 8.7 8.9  NEUTROABS 7.0  --   HGB 12.0 13.3  HCT 36.8 40.5  MCV 100.8* 101.3*  PLT 343 365   Cardiac Enzymes: No results for input(s): CKTOTAL, CKMB, CKMBINDEX, TROPONINI in the last 168 hours. BNP: BNP (last 3 results) Recent Labs    02/03/17 1551 03/19/17 0802 04/05/17 0327  BNP 391.6* 635.6* 870.7*    ProBNP (last 3 results) No results for input(s): PROBNP in the last 8760 hours.  CBG: No results for input(s): GLUCAP in the last 168 hours.     Signed:  Nita Sells MD   [facility] 05/18/2017, 8:37 AM

## 2017-05-18 NOTE — NC FL2 (Signed)
[place] LEVEL OF CARE SCREENING TOOL     IDENTIFICATION  Patient Name: Lori [name] Birthdate: 12-Feb-1919 Sex: female Admission Date (Current Location): 05/17/2017  [facility] and [place] Number:  Herbalist and Address:  [facility], [place] [address], [place], [place] [address]      Provider Number: [id number]  Attending Physician Name and Address:  Nita Sells, MD  Relative Name and Phone Number:       Current Level of Care: Hospital Recommended Level of Care: [place] Prior Approval Number:    Date Approved/Denied:   PASRR Number: [id number]  Discharge Plan: SNF    Current Diagnoses: Patient Active Problem List   Diagnosis Date Noted  . Subdural hematoma ([place]) 05/17/2017  . Unsteady gait 04/10/2017  . Moderate protein-calorie malnutrition ([place]) 04/10/2017  . Cognitive impairment 04/10/2017  . SVT (supraventricular tachycardia) ([place]) 04/05/2017  . Elevated troponin 04/05/2017  . History of PAT (paroxysmal atrial tachycardia) 04/01/2017  . Hypertension 03/19/2017  . Hypothyroidism, adult 03/19/2017  . Acute respiratory insufficiency 03/19/2017  . Moderate to severe mitral regurgitation 02/19/2017  . Community acquired pneumonia/Rt LL 02/03/2017  . CHF (congestive heart failure)/Unspecified 02/03/2017  . Vertebral fracture, osteoporotic/T5 02/03/2017  . Sternal fracture 02/03/2017  . Hyperglycemia 10/10/2016  . Back pain 10/09/2016  . Change in bowel habits 07/24/2013  . History of colon cancer 07/24/2013  . History of endometrial cancer 07/24/2013  . Loss of weight 07/24/2013  . Cystitis 03/16/2013  . Essential hypertension, benign 11/24/2012  . History of transient ischemic attack (TIA) 11/24/2012  . Hypothyroidism 11/24/2012    Orientation RESPIRATION BLADDER Height & Weight     Self  O2(Nasal Cannula; 2L) Continent Weight: 86 lb 10.3 oz (39.3 kg) Height:  5' (152.4 cm)   BEHAVIORAL SYMPTOMS/MOOD NEUROLOGICAL BOWEL NUTRITION STATUS      Continent (Please see d/c summary)  AMBULATORY STATUS COMMUNICATION OF NEEDS Skin   Limited Assist Verbally Normal                       Personal Care Assistance Level of Assistance  Bathing, Feeding, Dressing Bathing Assistance: Limited assistance Feeding assistance: Independent Dressing Assistance: Limited assistance     Functional Limitations Info  Hearing, Speech, Sight Sight Info: Impaired(Glasses) Hearing Info: Impaired(Hearing aids) Speech Info: Adequate    SPECIAL CARE FACTORS FREQUENCY  PT (By licensed PT), OT (By licensed OT)     PT Frequency: 3x OT Frequency: 3x            Contractures Contractures Info: Not present    Additional Factors Info  Code Status, Allergies Code Status Info: DNR Allergies Info: Bactrim Sulfamethoxazole-trimethoprim, Clindamycin/lincomycin, Macrobid Nitrofurantoin Macrocrystal, Penicillins, Sulfa Antibiotics, Zofran Ondansetron Hcl           Current Medications (05/18/2017):  This is the current hospital active medication list Current Facility-Administered Medications  Medication Dose Route Frequency Provider Last Rate Last Dose  . 0.9 %  sodium chloride infusion  250 mL Intravenous PRN Phillips Grout, MD      . amiodarone (PACERONE) tablet 100 mg  100 mg Oral BID Derrill Kay A, MD   100 mg at 05/18/17 0017  . atorvastatin (LIPITOR) tablet 5 mg  5 mg Oral QPM Derrill Kay A, MD   5 mg at 05/18/17 0017  . furosemide (LASIX) tablet 20 mg  20 mg Oral Daily Phillips Grout, MD      . metoprolol  tartrate (LOPRESSOR) tablet 37.5 mg  37.5 mg Oral BID Derrill Kay A, MD   37.5 mg at 05/18/17 0018  . mirtazapine (REMERON) tablet 7.5 mg  7.5 mg Oral QHS Derrill Kay A, MD   7.5 mg at 05/18/17 0017  . sodium chloride flush (NS) 0.9 % injection 3 mL  3 mL Intravenous Q12H Derrill Kay A, MD   3 mL at 05/18/17 0018  . sodium chloride flush (NS) 0.9 % injection 3 mL   3 mL Intravenous PRN Phillips Grout, MD         Discharge Medications: Please see discharge summary for a list of discharge medications.  Relevant Imaging Results:  Relevant Lab Results:   Additional Information SSN: [id number]  Eileen Stanford, LCSW

## 2017-05-18 NOTE — Clinical Social Work Placement (Signed)
   CLINICAL SOCIAL WORK PLACEMENT  NOTE  Date:  05/18/2017  Patient Details  Name: Lori [name] MRN: [id number] Date of Birth: October 25, 1918  Clinical Social Work is seeking post-discharge placement for this patient at the [place] level of care (*CSW will initial, date and re-position this form in  chart as items are completed):      Patient/family provided with [facility] Work Department's list of facilities offering this level of care within the geographic area requested by the patient (or if unable, by the patient's family).  Yes   Patient/family informed of their freedom to choose among providers that offer the needed level of care, that participate in Medicare, Medicaid or managed care program needed by the patient, have an available bed and are willing to accept the patient.      Patient/family informed of [facility]'s ownership interest in [facility] and [facility], as well as of the fact that they are under no obligation to receive care at these facilities.  PASRR submitted to EDS on       PASRR number received on       Existing PASRR number confirmed on 05/18/17     FL2 transmitted to all facilities in geographic area requested by pt/family on 05/18/17     FL2 transmitted to all facilities within larger geographic area on       Patient informed that his/her managed care company has contracts with or will negotiate with certain facilities, including the following:        Yes   Patient/family informed of bed offers received.  Patient chooses bed at [facility]     Physician recommends and patient chooses bed at      Patient to be transferred to [facility] on 05/18/17.  Patient to be transferred to facility by [facility]     Patient family notified on 05/18/17 of transfer.  Name of family member notified:  [name]     PHYSICIAN Please prepare prescriptions, Please sign FL2, Please sign DNR     Additional  Comment:    _______________________________________________ Eileen Stanford, LCSW 05/18/2017, 9:31 AM

## 2017-05-18 NOTE — Care Management (Signed)
Pt is from [facility] SNF - pt will discharge back there, transfer will be facilitated by CSW

## 2017-05-19 LAB — URINE CULTURE

## 2017-05-20 ENCOUNTER — Telehealth: Payer: Self-pay

## 2017-05-20 ENCOUNTER — Encounter: Payer: Self-pay | Admitting: Internal Medicine

## 2017-05-20 ENCOUNTER — Non-Acute Institutional Stay (SKILLED_NURSING_FACILITY): Payer: Medicare Other | Admitting: Internal Medicine

## 2017-05-20 ENCOUNTER — Other Ambulatory Visit: Payer: Self-pay

## 2017-05-20 DIAGNOSIS — R5381 Other malaise: Secondary | ICD-10-CM | POA: Diagnosis not present

## 2017-05-20 DIAGNOSIS — E43 Unspecified severe protein-calorie malnutrition: Secondary | ICD-10-CM

## 2017-05-20 DIAGNOSIS — I5033 Acute on chronic diastolic (congestive) heart failure: Secondary | ICD-10-CM | POA: Diagnosis not present

## 2017-05-20 DIAGNOSIS — Z7189 Other specified counseling: Secondary | ICD-10-CM | POA: Diagnosis not present

## 2017-05-20 DIAGNOSIS — J9601 Acute respiratory failure with hypoxia: Secondary | ICD-10-CM

## 2017-05-20 DIAGNOSIS — S065X9A Traumatic subdural hemorrhage with loss of consciousness of unspecified duration, initial encounter: Secondary | ICD-10-CM | POA: Diagnosis not present

## 2017-05-20 DIAGNOSIS — R05 Cough: Secondary | ICD-10-CM | POA: Diagnosis not present

## 2017-05-20 DIAGNOSIS — E039 Hypothyroidism, unspecified: Secondary | ICD-10-CM

## 2017-05-20 DIAGNOSIS — F419 Anxiety disorder, unspecified: Secondary | ICD-10-CM

## 2017-05-20 DIAGNOSIS — R2681 Unsteadiness on feet: Secondary | ICD-10-CM | POA: Diagnosis not present

## 2017-05-20 DIAGNOSIS — F0150 Vascular dementia without behavioral disturbance: Secondary | ICD-10-CM

## 2017-05-20 DIAGNOSIS — S065XAA Traumatic subdural hemorrhage with loss of consciousness status unknown, initial encounter: Secondary | ICD-10-CM

## 2017-05-20 DIAGNOSIS — I471 Supraventricular tachycardia: Secondary | ICD-10-CM | POA: Diagnosis not present

## 2017-05-20 DIAGNOSIS — Z8673 Personal history of transient ischemic attack (TIA), and cerebral infarction without residual deficits: Secondary | ICD-10-CM

## 2017-05-20 DIAGNOSIS — R059 Cough, unspecified: Secondary | ICD-10-CM

## 2017-05-20 MED ORDER — LORAZEPAM 0.5 MG PO TABS: 0.5000 mg | ORAL_TABLET | Freq: Two times a day (BID) | ORAL | 0 refills | Status: AC | PRN

## 2017-05-20 NOTE — Progress Notes (Signed)
Provider:  Blanchie Serve MD  Location:  [place] Room Number: 40 Place of Service:  SNF (31)  PCP: Blanchie Serve, MD Patient Care Team: Blanchie Serve, MD as PCP - General (Internal Medicine) Lori [name], DPM as Consulting Physician (Podiatry) Lori Balls, MD as Attending Physician (Orthopedic Surgery) Lori Breeding, MD as Consulting Physician (Cardiology)  Extended Emergency Contact Information Primary Emergency Contact: Lori [name] Address: [address]          [place], [place] [address] [name] of [place] Phone: [phone] Mobile Phone: [phone] Relation: Daughter Secondary Emergency Contact: Lori [name] Mobile Phone: [phone] Relation: Grandson  Code Status: DNR  Goals of Care: Advanced Directive information Advanced Directives 05/20/2017  Does Patient Have a Medical Advance Directive? Yes  Type of Paramedic of [place];Out of facility DNR (pink MOST or yellow form)  Does patient want to make changes to medical advance directive? No - Patient declined  Copy of [place] in Chart? Yes  Would patient like information on creating a medical advance directive? -  Pre-existing out of facility DNR order (yellow form or pink MOST form) Yellow form placed in chart (order not valid for inpatient use);Pink MOST form placed in chart (order not valid for inpatient use)      Chief Complaint  Patient presents with  . Readmit To SNF    Readmission Visit     HPI: Patient is a 84 y.o. female seen today for re-admission visit. She was in the hospital from 05/17/17-05/18/17 with speech abnormality. She was found to have subacute to acute subdural hematoma on right side with little mass effect. She was seen by neurosurgery and conservative management was recommended after discussion with family. Her aspirin was discontinued and she was sent back to [facility] for further care. She is  seen in her room today. She has been more confused and anxious since her return from hospital per nursing. She was started on ativan 0.5 mg bid as needed for anxiety and agitation by on call provider. She has medical history of TIA, subdural hematoma s/p burr hole, CHF, HTN, recent SVT, cognitive impairment among others. Today, she is having difficulty finding words and expressing herself. Limited participation in HPI and ROS. No fever reported by nursing. Nursing have noticed new cough x 1-2 days. On arrival to the facility, she was hypoxic on room air at 85% per nursing report. She was placed on oxygen by nasal canula 2 liters and her o2 sat was 94%. Poor safety awareness. While working with therapy team this am, limited following of instructions and has difficulty identifying person and object. No fall reported by nursing. She requires assistance with feeding and bowel/ bladder care.     Past Medical History:  Diagnosis Date  . Colon cancer ([place]) 2009   s/p colon resection  . Endometrial cancer ([place]) 1989   endometrial s/p hysterectomy, chemo and radiation  . Hearing loss   . History of blood transfusion   . Hypertension   . Macular degeneration   . Migraines   . Pneumonia   . Positive TB test   . Stroke [facility]) 2004   ? TIA, brief episode of speech issues - evaluate [place]   Past Surgical History:  Procedure Laterality Date  . ABDOMINAL HYSTERECTOMY  1989  . APPENDECTOMY    . COLON RESECTION  08/27/2007  . GALLBLADDER SURGERY  1970  . SUBDURAL HEMATOMA EVACUATION VIA CRANIOTOMY  2002  .  TONSILLECTOMY AND ADENOIDECTOMY  1943    reports that  has never smoked. she has never used smokeless tobacco. She reports that she drinks alcohol. She reports that she does not use drugs. Social History   Socioeconomic History  . Marital status: Widowed    Spouse name: Not on file  . Number of children: 2  . Years of education: Not on file  . Highest education level: Not on file  Social Needs  .  Financial resource strain: Not on file  . Food insecurity - worry: Not on file  . Food insecurity - inability: Not on file  . Transportation needs - medical: Not on file  . Transportation needs - non-medical: Not on file  Occupational History  . Occupation: retired  Tobacco Use  . Smoking status: Never Smoker  . Smokeless tobacco: Never Used  Substance and Sexual Activity  . Alcohol use: Yes    Comment: occ glass of wine- one every 2 months   . Drug use: No  . Sexual activity: Not on file  Other Topics Concern  . 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 Arthritis Mother   . Heart disease Father   . Hyperlipidemia Father   . Hypertension Father   . Diabetes Sister   . Crohn's disease Daughter     Health Maintenance  Topic Date Due  . DEXA SCAN  08/09/2019 (Originally 06/09/1985)  . TETANUS/TDAP  01/31/2023  . INFLUENZA VACCINE  Completed  . PNA vac Low Risk Adult  Completed    Allergies  Allergen Reactions  . Bactrim [Sulfamethoxazole-Trimethoprim] Itching    Blisters   . Clindamycin/Lincomycin Other (See Comments)    GI upset  . Macrobid [Nitrofurantoin Macrocrystal] Itching  . Penicillins Itching    Has patient had a PCN reaction causing immediate rash, facial/tongue/throat swelling, SOB or lightheadedness with hypotension Unknown Has patient had a PCN reaction causing severe rash involving mucus membranes or skin necrosis: Unknown Has patient had a PCN reaction that required hospitalization: Unknown Has patient had a PCN reaction occurring within the last 10 years:Unknown If all of the above answers are "NO", then may proceed with Cephalosporin use.   . Sulfa Antibiotics Itching  . Zofran [Ondansetron Hcl] Hives      Outpatient Encounter Medications as of 05/20/2017  Medication Sig  . acetaminophen (TYLENOL) 500 MG tablet Take 500 mg every 6 (six) hours as needed by mouth for mild pain.   Marland Kitchen amiodarone (PACERONE) 100 MG tablet Take 1 tablet (100 mg total) by mouth 2 (two) times daily.  Marland Kitchen atorvastatin (LIPITOR) 10 MG tablet Take 5 mg by mouth every evening.   . Cholecalciferol 1000 units capsule Take 1,000 Units daily by mouth.  . lactose free nutrition (BOOST) LIQD Take 237 mLs by mouth 2 (two) times daily between meals.  Marland Kitchen levothyroxine (SYNTHROID, LEVOTHROID) 50 MCG tablet Take 100 mcg by mouth daily before breakfast.  . LORazepam (ATIVAN) 0.5 MG tablet Take 0.5 mg by mouth 2 (two) times daily as needed for anxiety. Stop date 06/02/17  . Metoprolol Tartrate 37.5 MG TABS Take 1 tablet by mouth 2 (two) times daily.  Hold for SBP <110 or HR < 60  . mirtazapine (REMERON) 7.5 MG tablet Take 7.5 mg by mouth at bedtime.  . Multiple Vitamins-Minerals (EYE VITAMINS PO) Take 1 tablet by mouth daily. PRESERVISION  AREDS2  . potassium chloride SA (KLOR-CON M20) 20 MEQ tablet Take 1 tablet (20 mEq total) by mouth daily.  . riboflavin (VITAMIN B-2) 100 MG TABS tablet Take 100 mg by mouth daily.  . furosemide (LASIX) 20 MG tablet Take 20 mg by mouth daily. Hold for SBP <100   No facility-administered encounter medications on file as of 05/20/2017.     Review of Systems  Unable to perform ROS: Mental status change (limited, obtained from nursing and therapy team)  Constitutional: Positive for fatigue. Negative for fever.  HENT: Negative for mouth sores, rhinorrhea, sore throat and trouble swallowing.   Respiratory: Positive for cough. Negative for shortness of breath.   Cardiovascular: Negative for chest pain and palpitations.  Gastrointestinal: Negative for abdominal pain and vomiting.  Musculoskeletal: Positive for gait problem.  Neurological: Positive for weakness. Negative for dizziness and headaches.   Psychiatric/Behavioral: Positive for confusion. The patient is nervous/anxious.     Vitals:   05/20/17 1001  BP: (!) 108/56  Pulse: 64  Resp: 20  Temp: 99.3 F (37.4 C)  TempSrc: Oral  SpO2: 98%  Weight: 89 lb 12.8 oz (40.7 kg)  Height: 4\' 11"  (1.499 m)   Body mass index is 18.14 kg/m.   Wt Readings from Last 3 Encounters:  05/20/17 89 lb 12.8 oz (40.7 kg)  05/17/17 86 lb 10.3 oz (39.3 kg)  05/17/17 89 lb 12.8 oz (40.7 kg)   Physical Exam  Constitutional: No distress.  Thin built, frail female  HENT:  Head: Normocephalic and atraumatic.  Mouth/Throat: Oropharynx is clear and moist. No oropharyngeal exudate.  Some drooling noted at angle of mouth, no increased secretions on oral exam  Eyes: Conjunctivae and lids are normal. Right eye exhibits no discharge. Left eye exhibits no discharge. No scleral icterus. Right eye exhibits abnormal extraocular motion. Right eye exhibits no nystagmus. Left eye exhibits normal extraocular motion and no nystagmus. Right pupil is not reactive. Right pupil is round. Left pupil is round and reactive. Pupils are equal.  Pupil not reactive to light on right side, PERLA on left side. EOMI intact left side. Right eye unable to follow finger movement. Unable to percieve vision on right eye  Neck: Neck supple.  Cardiovascular: Normal rate.  Murmur heard. Pulmonary/Chest: Effort normal. No respiratory distress. She has no wheezes. She has rales. She exhibits no tenderness.  Kyphosis and scoliosis present, on 2 liters oxygen by nasal canula  Abdominal: Soft. Bowel sounds are normal. There is no tenderness.  Musculoskeletal: She exhibits deformity. She exhibits no edema or tenderness.  Able to move all 4 extremities, severe arthritis changes present to hands and fingers, generalized weakness, unsteady gait  Lymphadenopathy:    She has no cervical adenopathy.  Neurological: She has normal strength. She is disoriented. Coordination and gait abnormal.   Alert and oriented only to self  Skin: Skin is warm and dry. She is not diaphoretic.  Psychiatric:  Confused and anxious    Labs reviewed: Basic Metabolic Panel: Recent Labs    04/05/17 0324  04/06/17 0201  04/18/17 04/25/17 05/17/17 1925 05/18/17 0341  NA  --    < > 133*   < > 133*  133 138 136 137  K  --    < > 4.0   < >  4.7  4.7 4.2 3.6 3.7  CL  --    < > 99*  --   --   --  99* 101  CO2  --    < > 26  --   --   --  29 25  GLUCOSE  --    < > 161*  --   --   --  124* 96  BUN  --    < > 21*   < > 21  21 27* 18 15  CREATININE  --    < > 0.81   < > 0.8  0.81 0.8 0.85 0.76  CALCIUM  --    < > 8.6*  --  8.9  --  8.3* 9.1  MG 2.0  --   --   --   --   --   --   --    < > = values in this interval not displayed.   Liver Function Tests: Recent Labs    04/05/17 1205 04/18/17 05/17/17 1925  AST 62* 50 46*  ALT 51 57 51  ALKPHOS 112 115 243*  BILITOT 1.0 0.5 0.7  PROT 6.6 6.0 6.2*  ALBUMIN 3.0* 3.4 2.7*   No results for input(s): LIPASE, AMYLASE in the last 8760 hours. No results for input(s): AMMONIA in the last 8760 hours. CBC: Recent Labs    02/03/17 1551  04/05/17 0324  04/06/17 0201  04/18/17 05/17/17 1925 05/18/17 0341  WBC 6.0   < > 9.9  --  11.2*  --  5.7  5.7 8.7 8.9  NEUTROABS 4.4  --  6.9  --   --   --   --  7.0  --   HGB 11.2*   < > 11.7*   < > 11.5*   < > 12.3  12.3 12.0 13.3  HCT 34.6*   < > 35.9*   < > 35.0*   < > 36  36.1 36.8 40.5  MCV 92.8   < > 99.4  --  99.2  --   --  100.8* 101.3*  PLT 424*   < > 303  --  294   < > 406* 343 365   < > = values in this interval not displayed.   Cardiac Enzymes: Recent Labs    04/05/17 1205 04/05/17 1912 04/05/17 2151  TROPONINI 0.08* 0.08* 0.07*   BNP: Invalid input(s): POCBNP No results found for: HGBA1C Lab Results  Component Value Date   TSH 2.10 05/09/2017   Lab Results  Component Value Date   [id number] 182 02/19/2017   No results found for: FOLATE No results found for: IRON, TIBC,  FERRITIN  Imaging and Procedures obtained prior to SNF admission: Dg Chest 2 View  Result Date: 05/17/2017 CLINICAL DATA:  Altered mental status EXAM: CHEST  2 VIEW COMPARISON:  CXR 04/05/2017, CT 02/03/2017 FINDINGS: Re- demonstration of cardiomegaly, pulmonary vascular engorgement and interstitial edema. This appears somewhat more accentuated than on prior exam. Slight increase in bilateral small pleural effusions. No acute osseous abnormality. Scoliotic curvature of the lumbar spine convex to the right. Osteoarthritis of both glenohumeral joints with inferomedial spurring off the humeral heads. Thoracic kyphosis attributable to chronic multilevel thoracic compression fractures similar to prior CT. IMPRESSION: 1. Re- demonstration of diffuse interstitial edema with small bilateral pleural effusions. Stable cardiomegaly with aortic atherosclerosis. 2. Stable thoracic compression fractures. Electronically Signed   By: Ashley Royalty M.D.   On: 05/17/2017 20:06   Ct Head Wo  Contrast  Result Date: 05/17/2017 CLINICAL DATA:  Altered mental status.  Difficulty finding words. EXAM: CT HEAD WITHOUT CONTRAST TECHNIQUE: Contiguous axial images were obtained from the base of the skull through the vertex without intravenous contrast. COMPARISON:  03/30/2017 FINDINGS: Brain: There is a heterogeneous density subdural hematoma along the right cerebral convexity measuring up to 18 mm in thickness. There is mild mass effect on the atrophic right hemisphere. Chronic small vessel ischemia with gliosis in the cerebral white matter. Remote small to moderate infarcts in the left frontal and right occipital lobes. Small remote upper right cerebellar infarct. Small remote left inferior occipital infarct. Stable soft tissue density mass along the left frontal convexity consistent with meningioma. The mass measures 17 x 6 mm and does not contact the brain. Vascular: Atherosclerotic calcification. Skull: Remote biparietal burr holes.  Sinuses/Orbits: No evidence of injury. Other: Critical Value/emergent results were called by telephone at the time of interpretation on 05/17/2017 at 7:28 pm to Dr. Carmon Sails , who verbally acknowledged these results. IMPRESSION: 1. Mixed density subdural hematoma along the right cerebral convexity measuring up to 18 mm in thickness. Mass effect on the atrophic brain is mild. 2. Remote infarcts as described. 3. Stable 17 x 6 mm left frontal extra-axial mass, probable meningioma. Electronically Signed   By: Monte Fantasia M.D.   On: 05/17/2017 19:32    Assessment/Plan  Physical deconditioning With generalized weakness and subdural hematoma. Will have her work with physical therapy and occupational therapy team to help with gait training and muscle strengthening exercises.fall precautions. Skin care. Encourage to be out of bed as tolerated.   Unsteady gait With deconditioning and has poor safety awareness. Impaired vision to right eye could be adding further. Will have patient work with PT/OT as tolerated to regain strength and restore function as tolerated. Fall precautions.  Subdural hematoma To right with little mass effect. Reviewed note from neurosurgery from hospital. No witnessed fall documented on review of facility notes and on review from nursing staff. Supportive care for now. Avoid antiplatelet and antithrombotic agent.   Goals of care discussion With her overall poor prognosis with acute subdural hematoma, dementia, CHF, SVT and deconditioning among others, had meeting over telephone with her daughter/HCPOA to review goals of care. Social worker present during this meeting. Explained patient's clinical condition, new loss of vision to right eye, change of mental state and ongoing weakness to the daughter. Patient has poor safety awareness. Possibility of the bleed causing mass shift on the brain and affecting her vision and speech. This further has limited her with her ADLs and  communication of needs making her anxious. Daughter does not want hospitalization and surgical intervention at present. She agrees with adjusting dose of diuretic and adding duoneb to see if this will help with her trouble breathing. Explained to daughter about patient's soft BP reading and need to decrease metoprolol dosing to avoid hypotension and she agrees. I have explained patient's increased risk for aspiration and fall at present  Plan is to obtain chest xray and consider antibiotic for a trial period if needed for pneumonia. Continue with oxygen to help with her breathing and hypoxia. Patient is being followed by palliative care team at present. I have recommended transition to hospice services for now. Daughter agrees but would like for patient to work with PT, OT and SLP team for few days and if she is not able to make desired progress, to have hospice team involved. Social worker to contact palliative care  team and have their NP evaluate patient as well. Plan is for comfort care with palliative team services for now and therapy as tolerated. I have spent 30 minutes from 11:00 am to 11:30 am over the telephone reviewing goals of care and formulating care plan with patient's daughter HCPOA. Call was placed on 10:35 am and ended at 11:36 am. She is in agreement with current care plan.   Acute respiratory failure with hypoxia Likely from CHF exacerbation given rales on exam and increased interstitial edema noted on Chest xray from hospital. Continue oxygen at 2 liters by nasal canula for now. Increase dose of lasix from 20 mg daily to 20 mg bid for now and add duoneb. Another possibility is aspiration pneumonitis. Afebrile at present. Hold off on antibiotic for now. Reviewed wbc from hospital.   Acute on chronic CHF Currently on furosemide 20 mg daily with metoprolol tartrate 37.5 mg bid. BP on lower side of normal. With increase in furosemide to 20 mg bid, decrease metoprolol tartrate to 25 mg bid with  holding parameters. Continue o2 for now. Monitor weight. Continue kcl supplement.   Cough Her chf exacerbation could be contributing some. Another possibility is of aspiration. Aspiration risk, SLP consult. Aspiration precautions and assistance with feeding. duoneb tid for now. Monitor for increased oral secretions   Anxiety Likely multifactorial with her dementia, change in environment and SDH/ bleed. Continue lorazepam 0.5 mg bid prn for now.  Vascular dementia With multiple ischemic changes and infarct noted on brain imaging. Supportive care for now. Controlled BP  Severe protein calorie malnutrition Continue nutritional supplement and mirtazapine. Supportive care. Decline anticipated with deconditioning and dementia  SVT Continue amiodarone and reduced dosing of metoprolol tartrate for now  History of TIA Continue statin and antihypertensive. Off aspirin with SDH  Hypothyroidism Continue levothyroxine, no changes made    Family/ staff Communication: reviewed care plan with patient's daughter/HCPOA and charge nurse. Answered question from HCPOA to best of my knowledge.    Labs/tests ordered: none   Blanchie Serve, MD Internal Medicine [facility] [address] [place],  [address] Cell Phone (Monday-Friday 8 am - 5 pm): [phone] On Call: [phone] and follow prompts after 5 pm and on weekends Office Phone: [phone] Office Fax: [phone]

## 2017-05-20 NOTE — Telephone Encounter (Signed)
Possible re-admission to facility. This is a patient you were seeing at [facility] F/U is needed if patient was re-admitted to facility upon discharge. Hospital discharge from [facility] on 05/18/2017

## 2017-05-22 LAB — CULTURE, BLOOD (ROUTINE X 2)
CULTURE: NO GROWTH
Culture: NO GROWTH
SPECIAL REQUESTS: ADEQUATE
Special Requests: ADEQUATE

## 2017-05-23 DIAGNOSIS — R531 Weakness: Secondary | ICD-10-CM | POA: Diagnosis not present

## 2017-05-24 ENCOUNTER — Non-Acute Institutional Stay (SKILLED_NURSING_FACILITY): Payer: Medicare Other | Admitting: Family

## 2017-05-24 DIAGNOSIS — R451 Restlessness and agitation: Secondary | ICD-10-CM | POA: Diagnosis not present

## 2017-05-24 DIAGNOSIS — F419 Anxiety disorder, unspecified: Secondary | ICD-10-CM

## 2017-05-24 DIAGNOSIS — I509 Heart failure, unspecified: Secondary | ICD-10-CM | POA: Diagnosis not present

## 2017-05-24 DIAGNOSIS — R51 Headache: Secondary | ICD-10-CM | POA: Diagnosis not present

## 2017-05-24 DIAGNOSIS — R519 Headache, unspecified: Secondary | ICD-10-CM

## 2017-05-24 NOTE — Progress Notes (Signed)
Location:  [facility] Room Number: 40  Place of Service:  SNF (31) Provider: Janani Chamber FNP-C  Blanchie Serve, MD  Patient Care Team: Blanchie Serve, MD as PCP - General (Internal Medicine) Minus Breeding, MD as PCP - Cardiology (Cardiology) Regal, Tamala Fothergill, DPM as Consulting Physician (Podiatry) Almedia Balls, MD as Attending Physician (Orthopedic Surgery)  Extended Emergency Contact Information Primary Emergency Contact: Done,Catherine Address: [address]          [place], [place] [address] [name] of [place] Phone: [phone] Mobile Phone: [phone] Relation: Daughter Secondary Emergency Contact: Lucious Groves Mobile Phone: [phone] Relation: Grandson  Code Status:  DNR Goals of care: Advanced Directive information Advanced Directives 05/20/2017  Does Patient Have a Medical Advance Directive? Yes  Type of Paramedic of [place];Out of facility DNR (pink MOST or yellow form)  Does patient want to make changes to medical advance directive? No - Patient declined  Copy of [place] in Chart? Yes  Would patient like information on creating a medical advance directive? -  Pre-existing out of facility DNR order (yellow form or pink MOST form) Yellow form placed in chart (order not valid for inpatient use);Pink MOST form placed in chart (order not valid for inpatient use)     Chief Complaint  Patient presents with  . Acute Visit    increased agitation /anxiety and Headache     HPI:  Pt is a 84 y.o. female seen today at [facility] for an acute visit for evaluation of increased agitation,anxiety and headache.she is seen in her room today per facility Nurse request.Nurse reports patient has had increased agitation and anxiety.Patient has had increased anxiety /agitation unable to redirect Ativan 0.5 mg given but per Nurse patient continues to be agitated, restless and crying. .patient  also holding head stating "It hurts. It hurts".Tylenol PRN given but patient unable to swallow.she is observed holding head moaning and crying during visit.she continues to states her head hurts.she was seen 05/23/2016 by palliative care NP who spoke with patient's daughter.patient now under Hospice service.   Past Medical History:  Diagnosis Date  . Colon cancer ([place]) 2009   s/p colon resection  . Endometrial cancer ([place]) 1989   endometrial s/p hysterectomy, chemo and radiation  . Hearing loss   . History of blood transfusion   . Hypertension   . Macular degeneration   . Migraines   . Pneumonia   . Positive TB test   . Stroke [facility]) 2004   ? TIA, brief episode of speech issues - evaluate OH   Past Surgical History:  Procedure Laterality Date  . ABDOMINAL HYSTERECTOMY  1989  . APPENDECTOMY    . COLON RESECTION  08/27/2007  . GALLBLADDER SURGERY  1970  . SUBDURAL HEMATOMA EVACUATION VIA CRANIOTOMY  2002  . TONSILLECTOMY AND ADENOIDECTOMY  1943    Allergies  Allergen Reactions  . Bactrim [Sulfamethoxazole-Trimethoprim] Itching    Blisters   . Clindamycin/Lincomycin Other (See Comments)    GI upset  . Macrobid [Nitrofurantoin Macrocrystal] Itching  . Penicillins Itching    Has patient had a PCN reaction causing immediate rash, facial/tongue/throat swelling, SOB or lightheadedness with hypotension Unknown Has patient had a PCN reaction causing severe rash involving mucus membranes or skin necrosis: Unknown Has patient had a PCN reaction that required hospitalization: Unknown Has patient had a PCN reaction occurring within the last 10 years:Unknown If all of the above answers are "NO", then  may proceed with Cephalosporin use.   . Sulfa Antibiotics Itching  . Zofran [Ondansetron Hcl] Hives    Outpatient Encounter Medications as of 05/24/2017  Medication Sig  . acetaminophen (TYLENOL) 500 MG tablet Take 500 mg every 6 (six) hours as needed by mouth for mild pain.   Marland Kitchen  amiodarone (PACERONE) 100 MG tablet Take 1 tablet (100 mg total) by mouth 2 (two) times daily.  Marland Kitchen atorvastatin (LIPITOR) 10 MG tablet Take 5 mg by mouth every evening.   . Cholecalciferol 1000 units capsule Take 1,000 Units daily by mouth.  . furosemide (LASIX) 20 MG tablet Take 20 mg by mouth 2 (two) times daily. Hold for SBP <100  . lactose free nutrition (BOOST) LIQD Take 237 mLs by mouth 2 (two) times daily between meals.  Marland Kitchen levothyroxine (SYNTHROID, LEVOTHROID) 50 MCG tablet Take 100 mcg by mouth daily before breakfast.  . LORazepam (ATIVAN) 0.5 MG tablet Take 1 tablet (0.5 mg total) by mouth 2 (two) times daily as needed for anxiety. Stop date 06/02/17  . metoprolol tartrate (LOPRESSOR) 25 MG tablet Take 1 tablet by mouth 2 (two) times daily. Hold for SBP <110 or HR < 60  . mirtazapine (REMERON) 7.5 MG tablet Take 7.5 mg by mouth at bedtime.  . Multiple Vitamins-Minerals (EYE VITAMINS PO) Take 1 tablet by mouth daily. PRESERVISION  AREDS2  . potassium chloride SA (KLOR-CON M20) 20 MEQ tablet Take 1 tablet (20 mEq total) by mouth daily.  . riboflavin (VITAMIN B-2) 100 MG TABS tablet Take 100 mg by mouth daily.   No facility-administered encounter medications on file as of 05/24/2017.     Review of Systems  Unable to perform ROS: Dementia    Immunization History  Administered Date(s) Administered  . Influenza, High Dose Seasonal PF 03/16/2011, 02/21/2012, 03/10/2015, 02/06/2016, 02/01/2017  . Influenza,inj,Quad PF,6+ Mos 01/30/2013, 02/05/2014  . PPD Test 03/15/2017  . Pneumococcal Conjugate-13 02/05/2014  . Pneumococcal Polysaccharide-23 05/14/2000, 01/30/2013  . Tdap 01/30/2013   Pertinent  Health Maintenance Due  Topic Date Due  . DEXA SCAN  08/09/2019 (Originally 06/09/1985)  . INFLUENZA VACCINE  Completed  . PNA vac Low Risk Adult  Completed   Fall Risk  03/15/2017 02/14/2017 03/09/2016 01/12/2016 08/09/2014  Falls in the past year? Yes Yes No No No  Comment - - - Emmi  Telephone Survey: data to providers prior to load -  Number falls in past yr: 1 1 - - -  Injury with Fall? Yes Yes - - -  Risk for fall due to : - History of fall(s) - - -  Follow up Education provided Falls prevention discussed;Education provided - - -   Functional Status Survey:    Vitals:   05/24/17 1212  BP: 95/68  Pulse: 75  Resp: 20  Temp: 99.1 F (37.3 C)  SpO2: 94%  Weight: 90 lb (40.8 kg)  Height: 4\' 11"  (1.499 m)   Body mass index is 18.18 kg/m. Physical Exam  Constitutional:  Thin frail elderly crying and holding head " it hurts!Hurts!"  HENT:  Head: Normocephalic.  Right Ear: External ear normal.  Left Ear: External ear normal.  Mouth/Throat: Oropharynx is clear and moist. No oropharyngeal exudate.  Eyes: Conjunctivae are normal. Right eye exhibits no discharge. Left eye exhibits no discharge. No scleral icterus.  Left eye pupil round and reactive to light. Right eye non reactive.   Neck: Normal range of motion. No JVD present. No thyromegaly present.  Cardiovascular: Intact distal pulses. Exam reveals  no gallop and no friction rub.  Murmur heard. Pulmonary/Chest: Breath sounds normal. No respiratory distress. She has no wheezes. She has no rales.  Shortness of breath noted.on oxygen 2 liters via Nasal cannula.   Abdominal: Soft. Bowel sounds are normal. She exhibits no distension. There is no tenderness. There is no rebound and no guarding.  Musculoskeletal: She exhibits no edema or tenderness.  Unsteady gait uses wheelchair with assistance. Bilateral knee high ted hose in place   Lymphadenopathy:    She has no cervical adenopathy.  Neurological: She is alert.  Skin: Skin is warm and dry. No rash noted. No erythema.  Psychiatric:  Confused,crying and agitated during visit.   Labs reviewed: Recent Labs    04/05/17 0324  04/06/17 0201  04/18/17 04/25/17 05/17/17 1925 05/18/17 0341  NA  --    < > 133*   < > 133*  133 138 136 137  K  --    < > 4.0   <  > 4.7  4.7 4.2 3.6 3.7  CL  --    < > 99*  --   --   --  99* 101  CO2  --    < > 26  --   --   --  29 25  GLUCOSE  --    < > 161*  --   --   --  124* 96  BUN  --    < > 21*   < > 21  21 27* 18 15  CREATININE  --    < > 0.81   < > 0.8  0.81 0.8 0.85 0.76  CALCIUM  --    < > 8.6*  --  8.9  --  8.3* 9.1  MG 2.0  --   --   --   --   --   --   --    < > = values in this interval not displayed.   Recent Labs    04/05/17 1205 04/18/17 05/17/17 1925  AST 62* 50 46*  ALT 51 57 51  ALKPHOS 112 115 243*  BILITOT 1.0 0.5 0.7  PROT 6.6 6.0 6.2*  ALBUMIN 3.0* 3.4 2.7*   Recent Labs    02/03/17 1551  04/05/17 0324  04/06/17 0201  04/18/17 05/17/17 1925 05/18/17 0341  WBC 6.0   < > 9.9  --  11.2*  --  5.7  5.7 8.7 8.9  NEUTROABS 4.4  --  6.9  --   --   --   --  7.0  --   HGB 11.2*   < > 11.7*   < > 11.5*   < > 12.3  12.3 12.0 13.3  HCT 34.6*   < > 35.9*   < > 35.0*   < > 36  36.1 36.8 40.5  MCV 92.8   < > 99.4  --  99.2  --   --  100.8* 101.3*  PLT 424*   < > 303  --  294   < > 406* 343 365   < > = values in this interval not displayed.   Lab Results  Component Value Date   TSH 2.10 05/09/2017   No results found for: HGBA1C Lab Results  Component Value Date   CHOL 148 04/06/2017   HDL 83 04/06/2017   LDLCALC 53 04/06/2017   TRIG 59 04/06/2017   CHOLHDL 1.8 04/06/2017    Significant Diagnostic Results in last 30 days:  Dg Chest 2  View  Result Date: 05/17/2017 CLINICAL DATA:  Altered mental status EXAM: CHEST  2 VIEW COMPARISON:  CXR 04/05/2017, CT 02/03/2017 FINDINGS: Re- demonstration of cardiomegaly, pulmonary vascular engorgement and interstitial edema. This appears somewhat more accentuated than on prior exam. Slight increase in bilateral small pleural effusions. No acute osseous abnormality. Scoliotic curvature of the lumbar spine convex to the right. Osteoarthritis of both glenohumeral joints with inferomedial spurring off the humeral heads. Thoracic kyphosis  attributable to chronic multilevel thoracic compression fractures similar to prior CT. IMPRESSION: 1. Re- demonstration of diffuse interstitial edema with small bilateral pleural effusions. Stable cardiomegaly with aortic atherosclerosis. 2. Stable thoracic compression fractures. Electronically Signed   By: Ashley Royalty M.D.   On: 05/17/2017 20:06   Ct Head Wo Contrast  Result Date: 05/17/2017 CLINICAL DATA:  Altered mental status.  Difficulty finding words. EXAM: CT HEAD WITHOUT CONTRAST TECHNIQUE: Contiguous axial images were obtained from the base of the skull through the vertex without intravenous contrast. COMPARISON:  03/30/2017 FINDINGS: Brain: There is a heterogeneous density subdural hematoma along the right cerebral convexity measuring up to 18 mm in thickness. There is mild mass effect on the atrophic right hemisphere. Chronic small vessel ischemia with gliosis in the cerebral white matter. Remote small to moderate infarcts in the left frontal and right occipital lobes. Small remote upper right cerebellar infarct. Small remote left inferior occipital infarct. Stable soft tissue density mass along the left frontal convexity consistent with meningioma. The mass measures 17 x 6 mm and does not contact the brain. Vascular: Atherosclerotic calcification. Skull: Remote biparietal burr holes. Sinuses/Orbits: No evidence of injury. Other: Critical Value/emergent results were called by telephone at the time of interpretation on 05/17/2017 at 7:28 pm to Dr. Carmon Sails , who verbally acknowledged these results. IMPRESSION: 1. Mixed density subdural hematoma along the right cerebral convexity measuring up to 18 mm in thickness. Mass effect on the atrophic brain is mild. 2. Remote infarcts as described. 3. Stable 17 x 6 mm left frontal extra-axial mass, probable meningioma. Electronically Signed   By: Monte Fantasia M.D.   On: 05/17/2017 19:32   Assessment/Plan 1. Headache in front of head Crying and holding  head " it hurts!it Hurts!"unable swallow tylenol.she has had progressive decline in condition with recent subdural hematoma now on Hospice service.will start on Roxanol 20 mg/ml give 0.25 mls ( 5 mg) by mouth every four hours for pain first dose now.continue to monitor.continue on hospice service.   2. Agitation Has had Ativan but continues to be restless.One on one care and redirecting ineffective.Her headache could be contributing to her agitation.change Ativan 0.5 mg tablet to one by mouth three times daily.hold for sedation.continue to redirect and monitor.   3. Anxiety Change ativan as above.   4. Congestive heart failure Wt stable.Knee high ted hose in place.shortness of breath noted. Continue on oxygen 2 liters via nasal cannula,furosemide 20 mg tablet twice daily and metoprolol 25 mg tablet twice daily. On Potassium supplements. Add Roxanol 20 mg/ml take 0.25 mls by mouth every 4 hour as above. Continue to monitor. continue on hospice service.   Family/ staff Communication: Reviewed plan of care with patient and facility Nurse.   Labs/tests ordered: None   Jordany Russett C Tannar Broker, NP

## 2017-05-25 DIAGNOSIS — I619 Nontraumatic intracerebral hemorrhage, unspecified: Secondary | ICD-10-CM | POA: Diagnosis not present

## 2017-05-25 DIAGNOSIS — E785 Hyperlipidemia, unspecified: Secondary | ICD-10-CM | POA: Diagnosis not present

## 2017-05-25 DIAGNOSIS — I4891 Unspecified atrial fibrillation: Secondary | ICD-10-CM | POA: Diagnosis not present

## 2017-05-25 DIAGNOSIS — I509 Heart failure, unspecified: Secondary | ICD-10-CM | POA: Diagnosis not present

## 2017-05-25 DIAGNOSIS — G9340 Encephalopathy, unspecified: Secondary | ICD-10-CM | POA: Diagnosis not present

## 2017-05-25 DIAGNOSIS — Z85038 Personal history of other malignant neoplasm of large intestine: Secondary | ICD-10-CM | POA: Diagnosis not present

## 2017-05-25 DIAGNOSIS — I10 Essential (primary) hypertension: Secondary | ICD-10-CM | POA: Diagnosis not present

## 2017-05-25 DIAGNOSIS — E039 Hypothyroidism, unspecified: Secondary | ICD-10-CM | POA: Diagnosis not present

## 2017-05-26 DIAGNOSIS — E785 Hyperlipidemia, unspecified: Secondary | ICD-10-CM | POA: Diagnosis not present

## 2017-05-26 DIAGNOSIS — I4891 Unspecified atrial fibrillation: Secondary | ICD-10-CM | POA: Diagnosis not present

## 2017-05-26 DIAGNOSIS — I509 Heart failure, unspecified: Secondary | ICD-10-CM | POA: Diagnosis not present

## 2017-05-26 DIAGNOSIS — I10 Essential (primary) hypertension: Secondary | ICD-10-CM | POA: Diagnosis not present

## 2017-05-26 DIAGNOSIS — G9340 Encephalopathy, unspecified: Secondary | ICD-10-CM | POA: Diagnosis not present

## 2017-05-26 DIAGNOSIS — I619 Nontraumatic intracerebral hemorrhage, unspecified: Secondary | ICD-10-CM | POA: Diagnosis not present

## 2017-05-27 DIAGNOSIS — I10 Essential (primary) hypertension: Secondary | ICD-10-CM | POA: Diagnosis not present

## 2017-05-27 DIAGNOSIS — I509 Heart failure, unspecified: Secondary | ICD-10-CM | POA: Diagnosis not present

## 2017-05-27 DIAGNOSIS — E785 Hyperlipidemia, unspecified: Secondary | ICD-10-CM | POA: Diagnosis not present

## 2017-05-27 DIAGNOSIS — I4891 Unspecified atrial fibrillation: Secondary | ICD-10-CM | POA: Diagnosis not present

## 2017-05-27 DIAGNOSIS — G9340 Encephalopathy, unspecified: Secondary | ICD-10-CM | POA: Diagnosis not present

## 2017-05-27 DIAGNOSIS — I619 Nontraumatic intracerebral hemorrhage, unspecified: Secondary | ICD-10-CM | POA: Diagnosis not present

## 2017-05-28 ENCOUNTER — Encounter: Payer: Self-pay | Admitting: *Deleted

## 2017-05-28 ENCOUNTER — Telehealth: Payer: Self-pay | Admitting: *Deleted

## 2017-05-28 DIAGNOSIS — G9340 Encephalopathy, unspecified: Secondary | ICD-10-CM | POA: Diagnosis not present

## 2017-05-28 DIAGNOSIS — I4891 Unspecified atrial fibrillation: Secondary | ICD-10-CM | POA: Diagnosis not present

## 2017-05-28 DIAGNOSIS — E785 Hyperlipidemia, unspecified: Secondary | ICD-10-CM | POA: Diagnosis not present

## 2017-05-28 DIAGNOSIS — I10 Essential (primary) hypertension: Secondary | ICD-10-CM | POA: Diagnosis not present

## 2017-05-28 DIAGNOSIS — I619 Nontraumatic intracerebral hemorrhage, unspecified: Secondary | ICD-10-CM | POA: Diagnosis not present

## 2017-05-28 DIAGNOSIS — I509 Heart failure, unspecified: Secondary | ICD-10-CM | POA: Diagnosis not present

## 2017-05-28 NOTE — Telephone Encounter (Signed)
Copied from [place]. Topic: Inquiry >> May 27, 2017  3:54 PM [name], NT wrote: Reason for CRM: Lori [name] the pt daughter is calling to let Dr. [name] know that she went into a assisted living in November and has gone into hospice care as of Saturday 05/25/17. She just wanted to informed Dr. [name] so she would know.

## 2017-05-30 ENCOUNTER — Ambulatory Visit: Payer: Medicare Other | Admitting: Cardiology

## 2017-05-30 DIAGNOSIS — E785 Hyperlipidemia, unspecified: Secondary | ICD-10-CM | POA: Diagnosis not present

## 2017-05-30 DIAGNOSIS — G9340 Encephalopathy, unspecified: Secondary | ICD-10-CM | POA: Diagnosis not present

## 2017-05-30 DIAGNOSIS — I10 Essential (primary) hypertension: Secondary | ICD-10-CM | POA: Diagnosis not present

## 2017-05-30 DIAGNOSIS — I509 Heart failure, unspecified: Secondary | ICD-10-CM | POA: Diagnosis not present

## 2017-05-30 DIAGNOSIS — I4891 Unspecified atrial fibrillation: Secondary | ICD-10-CM | POA: Diagnosis not present

## 2017-05-30 DIAGNOSIS — I619 Nontraumatic intracerebral hemorrhage, unspecified: Secondary | ICD-10-CM | POA: Diagnosis not present

## 2017-06-14 DEATH — deceased

## 2017-07-11 ENCOUNTER — Ambulatory Visit: Payer: Medicare Other | Admitting: Family Medicine

## 2017-07-18 ENCOUNTER — Ambulatory Visit: Payer: Medicare Other | Admitting: Family Medicine

## 2018-05-29 IMAGING — DX DG CHEST 2V
1 series · 1 of 1 positions shown · non-contrast
Comparison: 02/27/2017

CLINICAL DATA: Shortness of Breath

EXAM:
CHEST  2 VIEW

[chest ap]
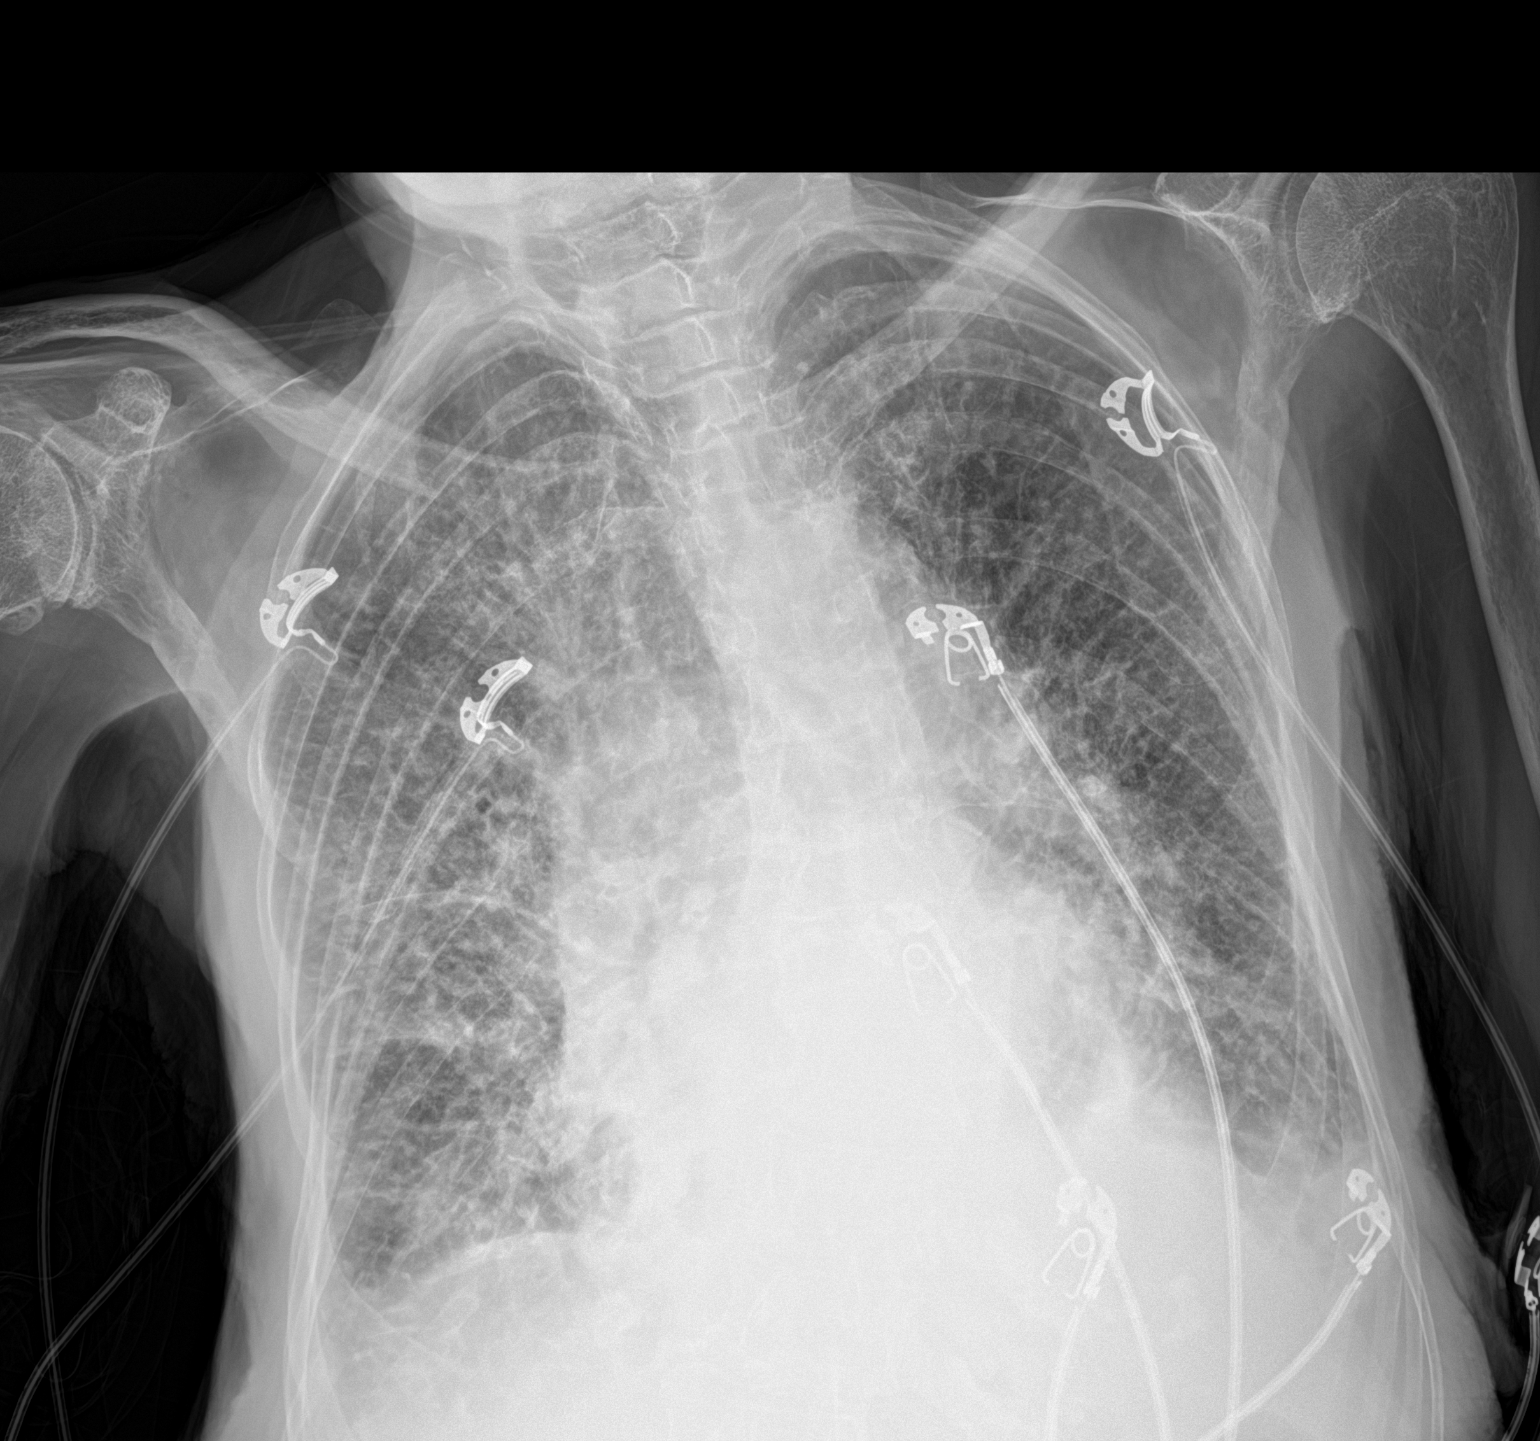

[1 of 1 positions shown; findings below may reference images not displayed]

FINDINGS: Cardiac shadow remains enlarged. Vascular congestion is noted with
significant interstitial edema increased from the prior exam
consistent with worsening CHF. Small effusions are noted
bilaterally. Some left basilar atelectasis is seen increased from
the prior exam. No bony abnormality is noted.
IMPRESSION: Worsening CHF

## 2018-06-09 IMAGING — CT CT HEAD W/O CM
3 of 4 series · 15 of 47 positions shown, 18 images · non-contrast
Comparison: None.

CLINICAL DATA: [AGE] female with head trauma.

EXAM:
CT HEAD WITHOUT CONTRAST
TECHNIQUE: Contiguous axial images were obtained from the base of the skull
through the vertex without intravenous contrast.

[Series 2: head w/o · axial · non-contrast · 0.45mm/px · z∈[-168,-53]mm · 9 of 29 slices shown, 12 images]
[im 3/29  brain]
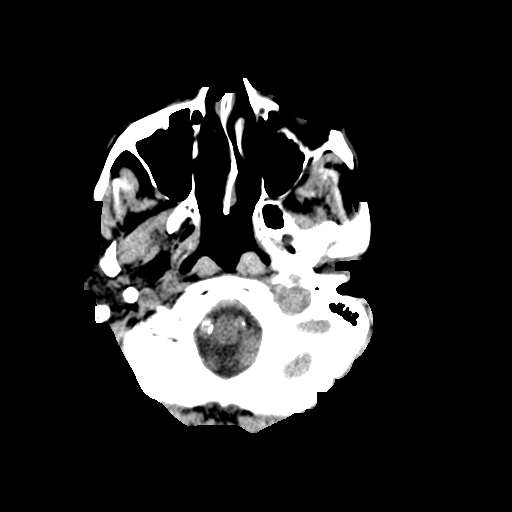
[im 3/29  bone]
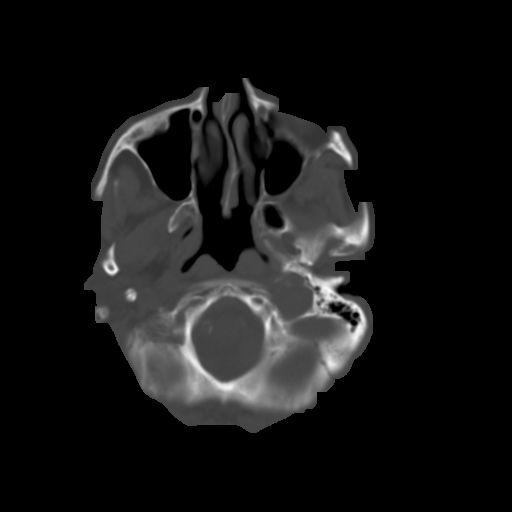
[im 6/29  brain]
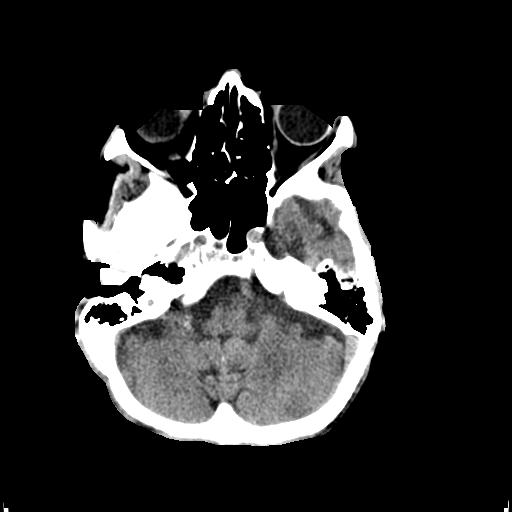
[im 9/29  brain]
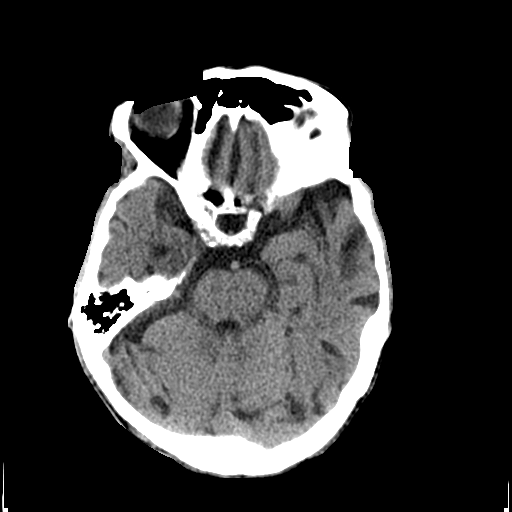
[im 12/29  brain]
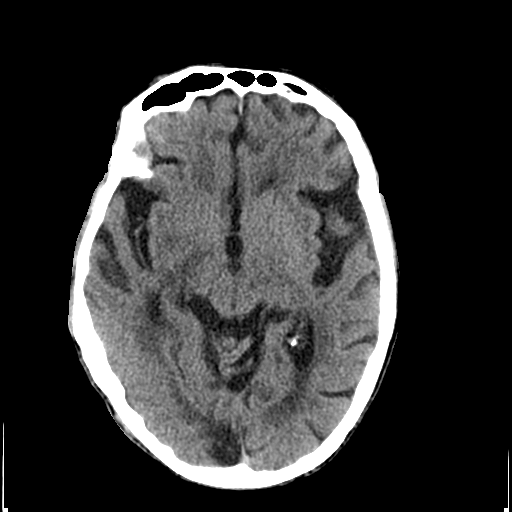
[im 15/29  brain]
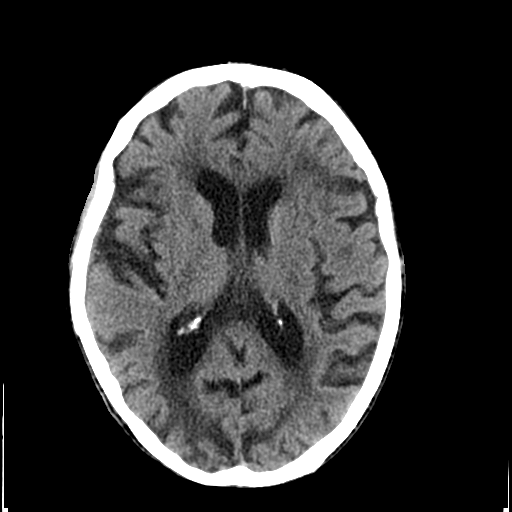
[im 15/29  bone]
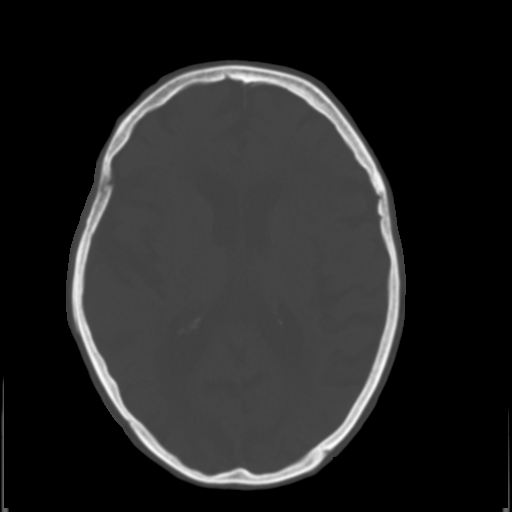
[im 17/29  brain]
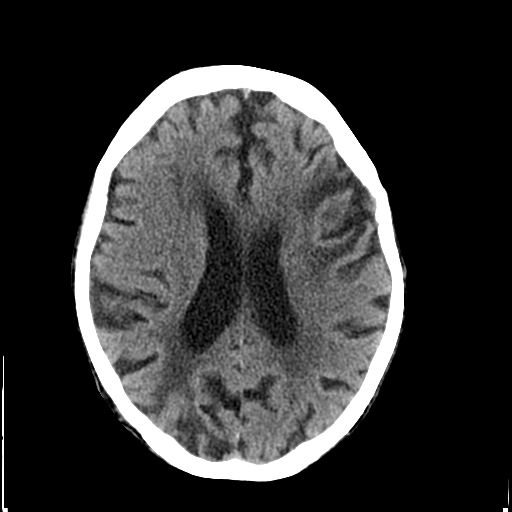
[im 20/29  brain]
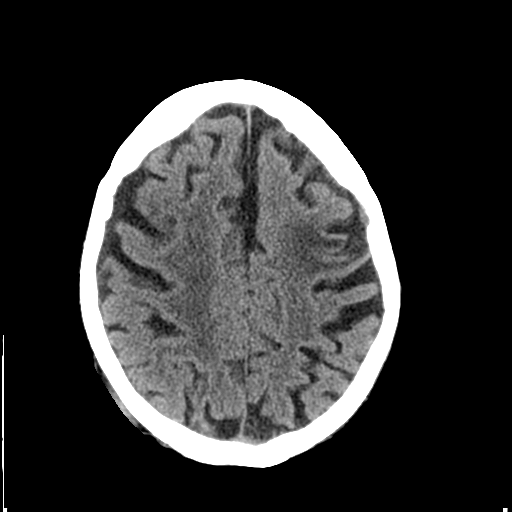
[im 23/29  brain]
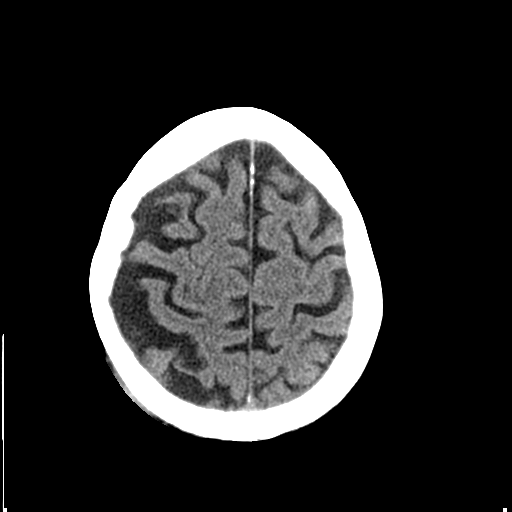
[im 26/29  brain]
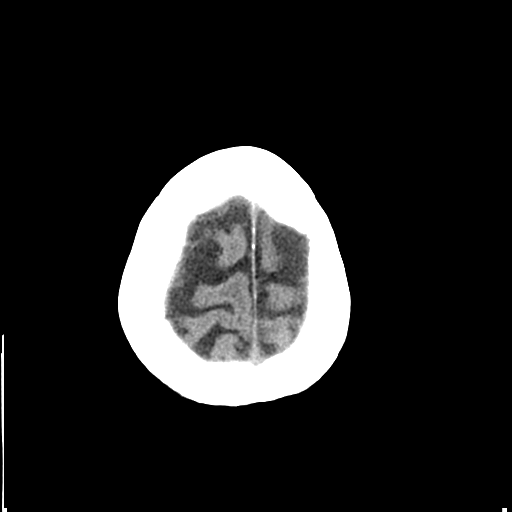
[im 26/29  bone]
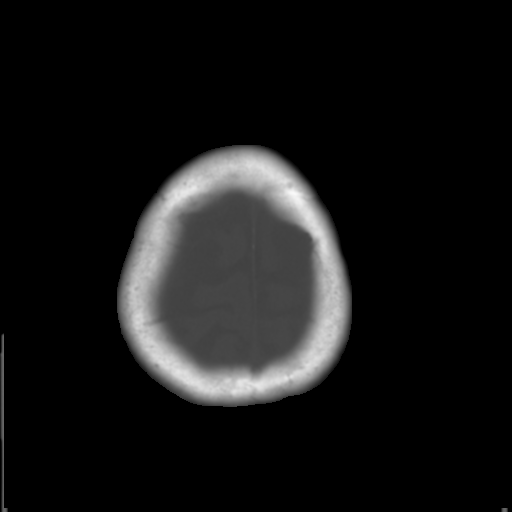

[Series 5: coronal · coronal · 0.29mm/px · 3 of 63 slices shown]
[im 21/63  brain]
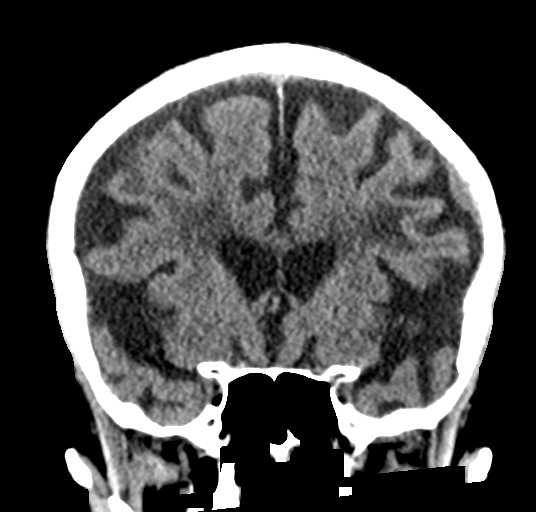
[im 28/63  brain]
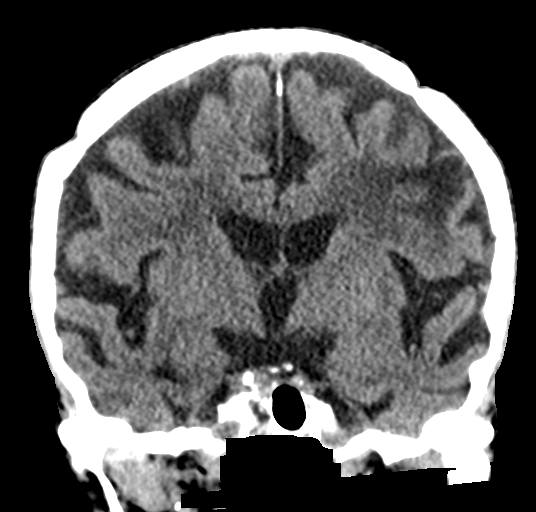
[im 35/63  brain]
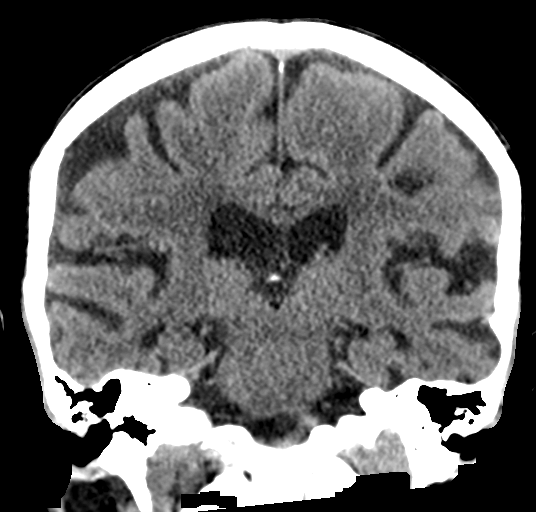

[Series 6: sagittal · sagittal · 0.29mm/px · 3 of 51 slices shown]
[im 17/51  brain]
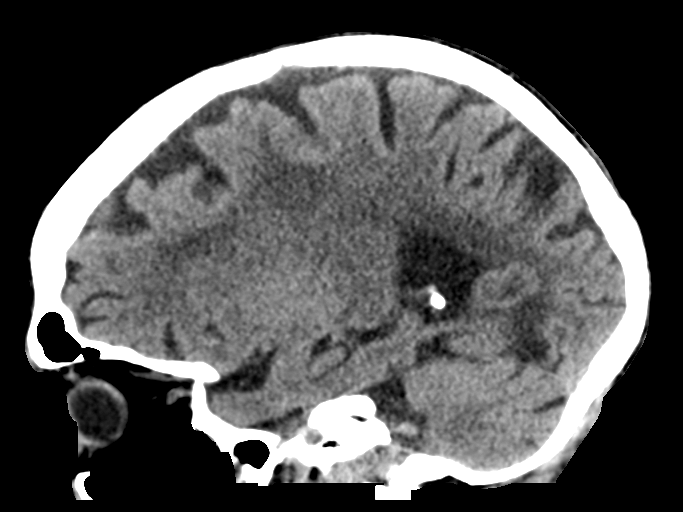
[im 26/51  brain]
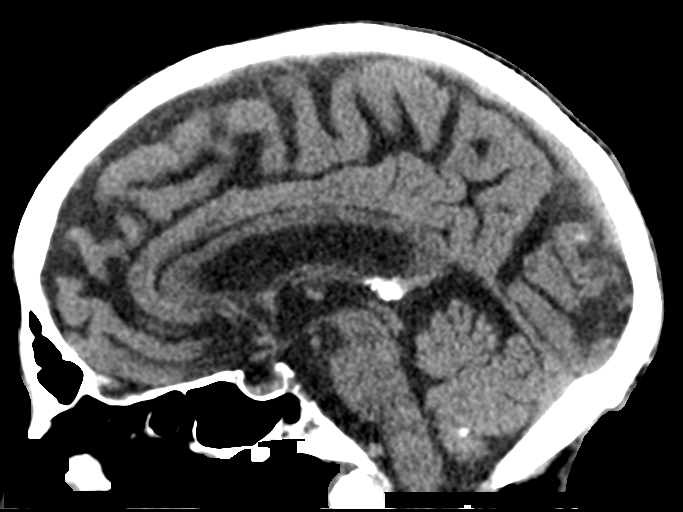
[im 34/51  brain]
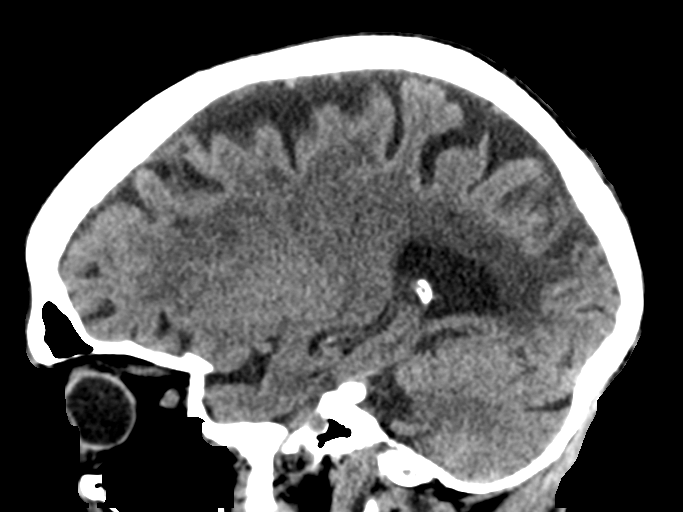

[15 of 47 positions shown; findings below may reference images not displayed]

FINDINGS: Brain: There is moderate age-related atrophy and chronic
microvascular ischemic changes. Areas of old infarct and
encephalomalacia noted involving the left frontal lobe and right
occipital lobe. Smaller areas of old infarct seen in the cerebellar
hemispheres. There is a 1.4 x 0.8 cm low attenuating ovoid density
along the inner table of the left frontal calvarium (series 2, image
19 and coronal series 5, image 24) most consistent with a non
calcified meningioma. There is no acute intracranial hemorrhage. No
mass effect or midline shift. No extra-axial fluid collection.

Vascular: No hyperdense vessel or unexpected calcification.

Skull: Bilateral parietal burr holes noted. No acute calvarial
pathology.

Sinuses/Orbits: No acute finding.

Other: Right posterior parietal scalp hematoma.
IMPRESSION: 1. No acute intracranial hemorrhage.
2. Low attenuating density along the left frontal calvarium most
consistent with noncalcified meningioma.
3. Moderate age-related atrophy and chronic microvascular ischemic
changes as well as old infarcts.

## 2018-06-15 IMAGING — DX DG CHEST 1V PORT
1 series · 1 of 1 positions shown · non-contrast
Comparison: 03/19/2017

CLINICAL DATA: Shortness of breath

EXAM:
PORTABLE CHEST 1 VIEW

[chest ap]
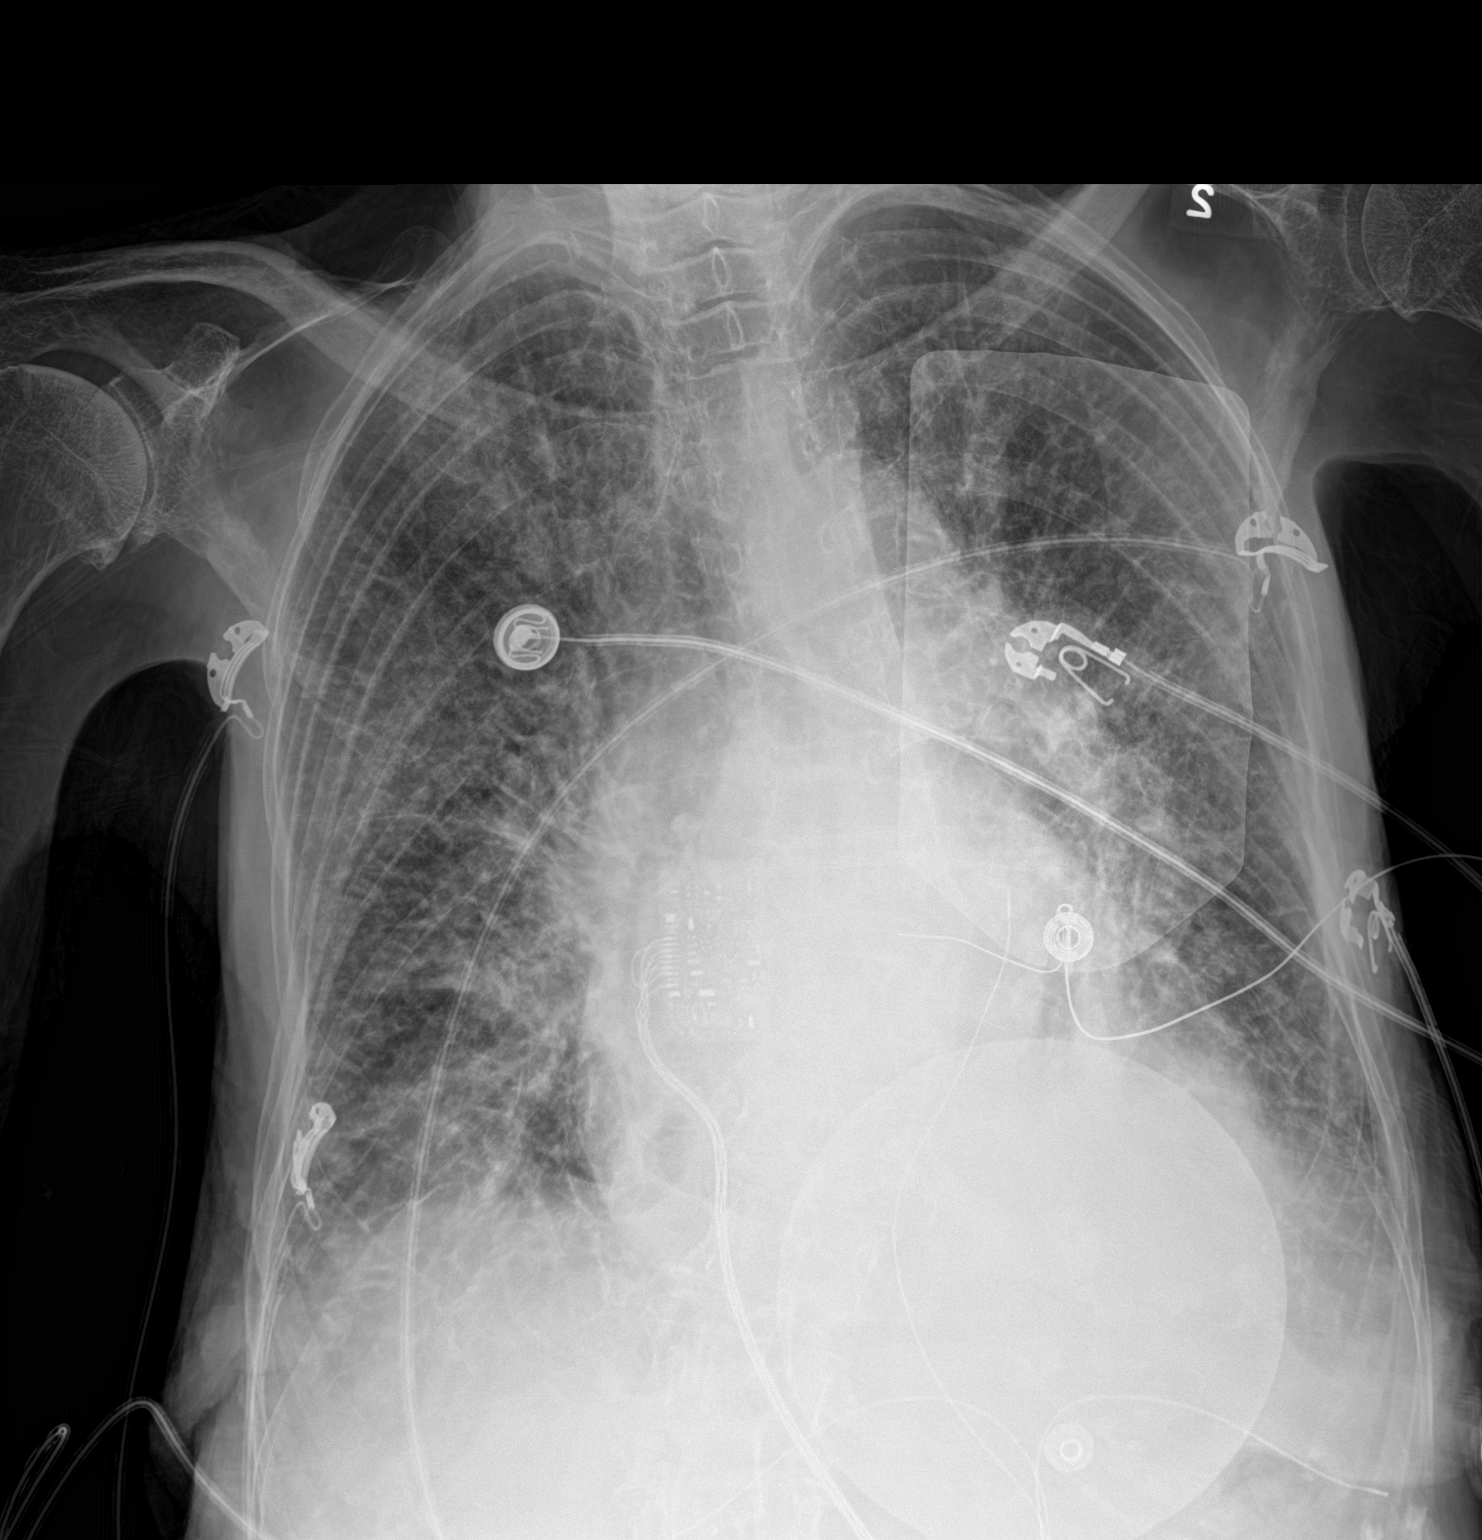

[1 of 1 positions shown; findings below may reference images not displayed]

FINDINGS: Cardiac enlargement with diffuse pulmonary vascular congestion.
Diffuse interstitial infiltration throughout the lungs likely due to
edema. Small bilateral pleural effusions. Changes are similar to
previous study and suggests congestive heart failure. No
pneumothorax. Tortuous aorta. Thoracic scoliosis convex towards the
left. Degenerative changes in the shoulders.
IMPRESSION: Cardiac enlargement with pulmonary vascular congestion and diffuse
interstitial edema. Similar appearance to previous study.
